# Patient Record
Sex: Female | Born: 1945
Health system: Southern US, Community
[De-identification: ages and names within clinical notes are randomized; demographics above are authoritative.]

## PROBLEM LIST (undated history)

## (undated) DIAGNOSIS — R7989 Other specified abnormal findings of blood chemistry: Secondary | ICD-10-CM

## (undated) DIAGNOSIS — E785 Hyperlipidemia, unspecified: Secondary | ICD-10-CM

## (undated) DIAGNOSIS — U071 COVID-19: Secondary | ICD-10-CM

## (undated) DIAGNOSIS — M810 Age-related osteoporosis without current pathological fracture: Secondary | ICD-10-CM

## (undated) HISTORY — DX: Other specified abnormal findings of blood chemistry: R79.89

## (undated) HISTORY — PX: ROOT CANAL: SHX2363

## (undated) HISTORY — DX: Hyperlipidemia, unspecified: E78.5

## (undated) HISTORY — PX: FACIAL COSMETIC SURGERY: SHX629

## (undated) HISTORY — PX: TUBAL LIGATION: SHX77

## (undated) HISTORY — DX: Age-related osteoporosis without current pathological fracture: M81.0

## (undated) HISTORY — PX: WRIST FRACTURE SURGERY: SHX121

## (undated) HISTORY — DX: COVID-19: U07.1

## (undated) HISTORY — PX: FRACTURE SURGERY: SHX138

---

## 2011-01-26 HISTORY — PX: KNEE ARTHROSCOPY: SUR90

## 2012-08-29 ENCOUNTER — Emergency Department: Payer: Self-pay | Admitting: Emergency Medicine

## 2012-10-21 ENCOUNTER — Encounter: Payer: Self-pay | Admitting: Orthopedic Surgery

## 2012-10-25 ENCOUNTER — Encounter: Payer: Self-pay | Admitting: Orthopedic Surgery

## 2012-11-25 ENCOUNTER — Encounter: Payer: Self-pay | Admitting: Orthopedic Surgery

## 2012-12-26 ENCOUNTER — Encounter: Payer: Self-pay | Admitting: Orthopedic Surgery

## 2013-01-25 ENCOUNTER — Encounter: Payer: Self-pay | Admitting: Orthopedic Surgery

## 2013-02-02 ENCOUNTER — Ambulatory Visit: Payer: Self-pay | Admitting: Family Medicine

## 2013-02-03 DIAGNOSIS — M6530 Trigger finger, unspecified finger: Secondary | ICD-10-CM | POA: Insufficient documentation

## 2013-02-03 DIAGNOSIS — S52502P Unspecified fracture of the lower end of left radius, subsequent encounter for closed fracture with malunion: Secondary | ICD-10-CM | POA: Insufficient documentation

## 2013-02-25 ENCOUNTER — Encounter: Payer: Self-pay | Admitting: Orthopedic Surgery

## 2013-03-27 ENCOUNTER — Encounter: Payer: Self-pay | Admitting: Orthopedic Surgery

## 2013-04-14 DIAGNOSIS — Z969 Presence of functional implant, unspecified: Secondary | ICD-10-CM | POA: Insufficient documentation

## 2013-04-27 ENCOUNTER — Encounter: Payer: Self-pay | Admitting: Orthopedic Surgery

## 2013-05-28 ENCOUNTER — Encounter: Payer: Self-pay | Admitting: Orthopedic Surgery

## 2013-06-25 ENCOUNTER — Encounter: Payer: Self-pay | Admitting: Orthopedic Surgery

## 2013-07-26 ENCOUNTER — Encounter: Payer: Self-pay | Admitting: Orthopedic Surgery

## 2013-08-25 ENCOUNTER — Encounter: Payer: Self-pay | Admitting: Orthopedic Surgery

## 2014-01-10 ENCOUNTER — Ambulatory Visit: Payer: Self-pay | Admitting: Family Medicine

## 2014-01-26 ENCOUNTER — Ambulatory Visit: Payer: Self-pay | Admitting: Gastroenterology

## 2014-07-16 DIAGNOSIS — M79605 Pain in left leg: Secondary | ICD-10-CM | POA: Diagnosis not present

## 2014-07-16 DIAGNOSIS — E784 Other hyperlipidemia: Secondary | ICD-10-CM | POA: Diagnosis not present

## 2014-07-16 DIAGNOSIS — M65331 Trigger finger, right middle finger: Secondary | ICD-10-CM | POA: Diagnosis not present

## 2014-07-16 DIAGNOSIS — Z7189 Other specified counseling: Secondary | ICD-10-CM | POA: Diagnosis not present

## 2014-08-06 DIAGNOSIS — M65331 Trigger finger, right middle finger: Secondary | ICD-10-CM | POA: Diagnosis not present

## 2014-08-26 HISTORY — PX: TRIGGER FINGER RELEASE: SHX641

## 2014-09-19 DIAGNOSIS — M65331 Trigger finger, right middle finger: Secondary | ICD-10-CM | POA: Diagnosis not present

## 2014-10-10 DIAGNOSIS — Z9889 Other specified postprocedural states: Secondary | ICD-10-CM | POA: Diagnosis not present

## 2014-10-15 ENCOUNTER — Encounter: Payer: Self-pay | Admitting: Family Medicine

## 2014-10-15 ENCOUNTER — Ambulatory Visit (INDEPENDENT_AMBULATORY_CARE_PROVIDER_SITE_OTHER): Payer: Commercial Managed Care - HMO | Admitting: Family Medicine

## 2014-10-15 VITALS — BP 108/66 | HR 92 | Temp 97.6°F | Ht 64.5 in | Wt 137.0 lb

## 2014-10-15 DIAGNOSIS — E785 Hyperlipidemia, unspecified: Secondary | ICD-10-CM | POA: Diagnosis not present

## 2014-10-15 DIAGNOSIS — E559 Vitamin D deficiency, unspecified: Secondary | ICD-10-CM | POA: Diagnosis not present

## 2014-10-15 DIAGNOSIS — D649 Anemia, unspecified: Secondary | ICD-10-CM | POA: Diagnosis not present

## 2014-10-15 NOTE — Progress Notes (Signed)
Name: Stacey Munoz   MRN: [id number]    DOB: 1945/07/05   Date:10/15/2014       Progress Note  Subjective  Chief Complaint  Chief Complaint  Patient presents with  . Follow-up    Vitamin D deficiency    Hyperlipidemia This is a chronic problem. The problem is uncontrolled. Recent lipid tests were reviewed and are high. Pertinent negatives include no chest pain, leg pain, myalgias or shortness of breath. Current antihyperlipidemic treatment includes exercise and diet change (Patient has been resistant to statin therapy in the past). Compliance problems include adherence to exercise and adherence to diet.  Risk factors for coronary artery disease include dyslipidemia, family history and post-menopausal.      Past Medical History  Diagnosis Date  . Low serum vitamin D   . Hyperlipidemia     Past Surgical History  Procedure Laterality Date  . Tubal ligation    . Wrist fracture surgery    . Trigger finger release Right May 2016.    Family History  Problem Relation Age of Onset  . Cancer Mother   . Diabetes Father   . Cancer Father   . Heart disease Brother     History   Social History  . Marital Status: Unknown    Spouse Name: N/A  . Number of Children: N/A  . Years of Education: N/A   Occupational History  . Not on file.   Social History Main Topics  . Smoking status: Never Smoker   . Smokeless tobacco: Not on file  . Alcohol Use: 0.6 oz/week    0 Standard drinks or equivalent, 1 Glasses of wine per week  . Drug Use: No  . Sexual Activity: Not on file   Other Topics Concern  . Not on file   Social History Narrative  . No narrative on file     Current outpatient prescriptions:  .  calcium-vitamin D (SM CALCIUM 500/VITAMIN D3) 500-400 MG-UNIT per tablet, Take by mouth., Disp: , Rfl:  .  Flaxseed-Eve Prim-Borage (FLAX OIL XTRA) CAPS, Take by mouth., Disp: , Rfl:  .  Multiple Vitamin (MULTI-VITAMINS) TABS, Take by mouth., Disp: , Rfl:   Not on  File   Review of Systems  Respiratory: Negative for shortness of breath.   Cardiovascular: Negative for chest pain and palpitations.  Musculoskeletal: Negative for myalgias.      Objective  Filed Vitals:   10/15/14 0845  BP: 108/66  Pulse: 92  Temp: 97.6 F (36.4 C)  TempSrc: Oral  Height: 5' 4.5" (1.638 m)  Weight: 137 lb (62.143 kg)  SpO2: 98%    Physical Exam  Constitutional: She is oriented to person, place, and time and well-developed, well-nourished, and in no distress.  HENT:  Head: Normocephalic and atraumatic.  Cardiovascular: Normal rate and regular rhythm.   Pulmonary/Chest: Effort normal and breath sounds normal.  Abdominal: Soft. Bowel sounds are normal.  Neurological: She is alert and oriented to person, place, and time.  Nursing note and vitals reviewed.      No results found for this or any previous visit (from the past 2160 hour(s)).   Assessment & Plan 1. Low hemoglobin Patient has been told told that she had low iron when trying to donate blood. We will obtain EBC for evaluation of hemoglobin and hematocrit first and then further iron studies if indicated. - CBC w/Diff  2. Vitamin D deficiency  - Vitamin D (25 hydroxy)  3. Hyperlipidemia Shouldn't is not agreeable to taking a  statin for hyperlipidemia. We will repeat fasting lipid panel and follow-up - Lipid panel    Shera Laubach [facility] 10/15/2014 9:11 AM

## 2014-10-16 LAB — CBC WITH DIFFERENTIAL/PLATELET
BASOS ABS: 0 10*3/uL (ref 0.0–0.2)
Basos: 0 %
EOS (ABSOLUTE): 0.2 10*3/uL (ref 0.0–0.4)
Eos: 4 %
HEMOGLOBIN: 14.4 g/dL (ref 11.1–15.9)
Hematocrit: 42.9 % (ref 34.0–46.6)
Immature Grans (Abs): 0 10*3/uL (ref 0.0–0.1)
Immature Granulocytes: 0 %
LYMPHS: 37 %
Lymphocytes Absolute: 1.8 10*3/uL (ref 0.7–3.1)
MCH: 30.4 pg (ref 26.6–33.0)
MCHC: 33.6 g/dL (ref 31.5–35.7)
MCV: 91 fL (ref 79–97)
MONOCYTES: 5 %
Monocytes Absolute: 0.2 10*3/uL (ref 0.1–0.9)
NEUTROS ABS: 2.7 10*3/uL (ref 1.4–7.0)
NEUTROS PCT: 54 %
Platelets: 236 10*3/uL (ref 150–379)
RBC: 4.74 x10E6/uL (ref 3.77–5.28)
RDW: 14.1 % (ref 12.3–15.4)
WBC: 4.9 10*3/uL (ref 3.4–10.8)

## 2014-10-16 LAB — VITAMIN D 25 HYDROXY (VIT D DEFICIENCY, FRACTURES): VIT D 25 HYDROXY: 31.5 ng/mL (ref 30.0–100.0)

## 2014-10-16 LAB — LIPID PANEL
CHOLESTEROL TOTAL: 291 mg/dL — AB (ref 100–199)
Chol/HDL Ratio: 4.7 ratio units — ABNORMAL HIGH (ref 0.0–4.4)
HDL: 62 mg/dL (ref >39)
LDL CALC: 172 mg/dL — AB (ref 0–99)
Triglycerides: 287 mg/dL — ABNORMAL HIGH (ref 0–149)
VLDL Cholesterol Cal: 57 mg/dL — ABNORMAL HIGH (ref 5–40)

## 2014-10-16 NOTE — Progress Notes (Signed)
Called patient, LM to call us that I have her labs and the Dr has some recommendations.

## 2014-10-17 ENCOUNTER — Other Ambulatory Visit: Payer: Self-pay | Admitting: *Deleted

## 2014-10-17 DIAGNOSIS — E785 Hyperlipidemia, unspecified: Secondary | ICD-10-CM

## 2014-10-17 MED ORDER — ROSUVASTATIN CALCIUM 5 MG PO TABS: 5.0000 mg | ORAL_TABLET | Freq: Every day | ORAL | Status: DC

## 2014-10-17 NOTE — Progress Notes (Signed)
Patient came into office, reviewed labs, she was agreeable to the lowest Crestor dose.  Talked to Dr Manuella Ghazi, ordered Crestor 5mg  daily.

## 2014-10-18 ENCOUNTER — Ambulatory Visit: Payer: Commercial Managed Care - HMO | Attending: Orthopedic Surgery | Admitting: Occupational Therapy

## 2014-10-18 DIAGNOSIS — M25641 Stiffness of right hand, not elsewhere classified: Secondary | ICD-10-CM | POA: Insufficient documentation

## 2014-10-18 DIAGNOSIS — M6281 Muscle weakness (generalized): Secondary | ICD-10-CM | POA: Diagnosis not present

## 2014-10-18 DIAGNOSIS — M25541 Pain in joints of right hand: Secondary | ICD-10-CM

## 2014-10-18 DIAGNOSIS — M79641 Pain in right hand: Secondary | ICD-10-CM | POA: Diagnosis not present

## 2014-10-18 NOTE — Therapy (Signed)
[facility] [address] [place], [address] Phone: [phone]   Fax:  [phone]  Occupational Therapy Treatment  Patient Details  Name: Stacey Munoz MRN: [id number] Date of Birth: July 05, 1945 Referring Provider:  Hessie Knows, MD  Encounter Date: 10/18/2014      OT End of Session - 10/18/14 1909    OT Start Time 1856   OT Stop Time 1600   OT Time Calculation (min) 45 min   Activity Tolerance Patient tolerated treatment well   Behavior During Therapy [facility] for tasks assessed/performed      Past Medical History  Diagnosis Date  . Low serum vitamin D   . Hyperlipidemia     Past Surgical History  Procedure Laterality Date  . Tubal ligation    . Wrist fracture surgery    . Trigger finger release Right May 2016.    There were no vitals filed for this visit.  Visit Diagnosis:  Pain in joint of right hand - Plan: Ot plan of care cert/re-cert  Stiffness of finger joint of right hand - Plan: Ot plan of care cert/re-cert  Muscle weakness - Plan: Ot plan of care cert/re-cert      Subjective Assessment - 10/18/14 1520    Subjective  My finger and in palm over scar still swolllen, sore making fist, tight - see DR July 11th - cannot grip things - afraid for scar tissue   Patient Stated Goals I do not want scar tissue like my L wrist fracture the time - want to make fist , no scar tissue and use it  without issues   Currently in Pain? Yes   Pain Score 3    Pain Location Hand   Pain Orientation Right   Pain Descriptors / Indicators Contraction   Pain Onset More than a month ago   Pain Frequency Constant            [facility] OT Assessment - 10/18/14 0001    Edema   Edema  Proximal phalanges R 6.6 ,L 5.9; PIP R 6.6 and L 6.3   Strength   Right Hand Grip (lbs) 42   Right Hand Lateral Pinch 15 lbs   Right Hand 3 Point Pinch 10 lbs   Left Hand Grip (lbs) 45   Left Hand Lateral Pinch 17 lbs   Left Hand 3 Point Pinch 15 lbs   Right Hand AROM   R Long  MCP 0-90 90 Degrees   R Long PIP 0-100 85 Degrees  -10   R Long DIP 0-70 60 Degrees                  OT Treatments/Exercises (OP) - 10/18/14 0001    RUE Paraffin   Number Minutes Paraffin 10 Minutes   RUE Paraffin Location Hand   Comments decrease scar tissue and decrease pain prior to review of HEP                 OT Education - 10/18/14 1542    Education provided Yes   Education Details HEP   Person(s) Educated Patient   Methods Explanation;Demonstration;Verbal cues   Comprehension Verbalized understanding;Returned demonstration;Verbal cues required          OT Short Term Goals - 10/18/14 1914    OT SHORT TERM GOAL #1   Title Pt's flexion of 3rd digit  to make full fist  to hold onto knife to cut without increase symptoms   Baseline  flexion 85 PIP and tenderness - pain at the worse 8/10   Time 3   Period Weeks   Status New   OT SHORT TERM GOAL #2   Title Pain on PRWHE improve by 10-15 points    Baseline PRWHE pain 30/50   Time 3   Period Weeks   Status New   OT SHORT TERM GOAL #3   Title Extention to WNL to put hand in pocket    Baseline -10 degrees at PIP    Time 2   Period Weeks   Status New           OT Long Term Goals - 11-10-14 1918    OT LONG TERM GOAL #1   Title Funciton on PRWHE improve by 10 points at least   Baseline Function on PRWHE was 19.5/50    Time 4   Period Weeks   Status New   OT LONG TERM GOAL #2   Title 3 point pinch improve with 3 lbs to improve functional use of R hand without increase pain    Time 4   Period Weeks   Status New               Plan - 10-Nov-2014 1910    Clinical Impression Statement Pt present about 4 wks out of trigger finger release on R 3rd digit - pt had increase edema, pain     and scar tissue , decrease ROM and strength - limiting her functional use of R dominant hand - she llikes to remodel, travel , read , active in  communty -    Pt will benefit from skilled therapeutic intervention in order to improve on the following deficits (Retired) Decreased range of motion;Impaired flexibility;Increased edema;Pain;Decreased scar mobility;Decreased strength   Rehab Potential Good   OT Frequency 2x / week   OT Duration 4 weeks   OT Treatment/Interventions Self-care/ADL training;Parrafin;Ultrasound;Moist Heat;Therapeutic exercise;Manual Therapy;Scar mobilization;Passive range of motion;Splinting;Patient/family education   OT Home Exercise Plan See pt instruction    Consulted and Agree with Plan of Care Patient          G-Codes - 2014/11/10 1922    Functional Assessment Tool Used PRWHE, ROM , strength, clinical judgement   Functional Limitation Self care   Self Care Current Status [phone]) At least 20 percent but less than 40 percent impaired, limited or restricted   Self Care Goal Status ([id number]) At least 1 percent but less than 20 percent impaired, limited or restricted      Problem List Patient Active Problem List   Diagnosis Date Noted  . Low hemoglobin 10/15/2014  . Vitamin D deficiency 10/15/2014  . Hyperlipidemia 10/15/2014  . Triggering of digit 02/03/2013    Rosalyn Gess OTR/L, CLT 11/10/14, 7:26 PM  [facility] [address], [place], [address] Phone: [phone]   Fax:  [phone]

## 2014-10-18 NOTE — Patient Instructions (Addendum)
Heat , Composite flexion of  4th digit',  blocked  AROM PIP and DIP   Compression sleeve night time and day use  Extention rolling putty green  Scar massage and scar pad at night time

## 2014-10-22 ENCOUNTER — Ambulatory Visit: Payer: Commercial Managed Care - HMO | Admitting: Occupational Therapy

## 2014-10-22 DIAGNOSIS — M25641 Stiffness of right hand, not elsewhere classified: Secondary | ICD-10-CM | POA: Diagnosis not present

## 2014-10-22 DIAGNOSIS — M6281 Muscle weakness (generalized): Secondary | ICD-10-CM

## 2014-10-22 DIAGNOSIS — M25541 Pain in joints of right hand: Secondary | ICD-10-CM

## 2014-10-22 DIAGNOSIS — M79641 Pain in right hand: Secondary | ICD-10-CM | POA: Diagnosis not present

## 2014-10-22 NOTE — Patient Instructions (Signed)
Same HEP but add buddy strap to use in between HEP session and alternate between 2nd and 4th digit -

## 2014-10-22 NOTE — Therapy (Signed)
[facility] [address] [place], [address] Phone: [phone]   Fax:  [phone]  Occupational Therapy Treatment  Patient Details  Name: Stacey Munoz MRN: [id number] Date of Birth: Sep 08, 1945 Referring Provider:  Hessie Knows, MD  Encounter Date: 10/22/2014      OT End of Session - 10/22/14 1126    OT Start Time 1005   OT Stop Time 1101   OT Time Calculation (min) 56 min      Past Medical History  Diagnosis Date  . Low serum vitamin D   . Hyperlipidemia     Past Surgical History  Procedure Laterality Date  . Tubal ligation    . Wrist fracture surgery    . Trigger finger release Right May 2016.    There were no vitals filed for this visit.  Visit Diagnosis:  Pain in joint of right hand  Stiffness of finger joint of right hand  Muscle weakness      Subjective Assessment - 10/22/14 1116    Subjective  My finger still stiff and did yoga this am and execises and I cannot make tight fist -this miiddle finger not going down - also scar puffy   Patient Stated Goals I do not want scar tissue like my L wrist fracture the time - want to make fist , no scar tissue and use it  without issues   Currently in Pain? Yes   Pain Score 2    Pain Location Hand   Pain Orientation Right   Pain Descriptors / Indicators Tightness   Pain Type Surgical pain   Pain Onset More than a month ago   Aggravating Factors  if trying to make tight fist                      OT Treatments/Exercises (OP) - 10/22/14 0001    Exercises   Exercises Hand   Hand Exercises   Other Hand Exercises tendon gliding , full fist place and hold , and AROM - squeezing small ball of putty at end range    Other Hand Exercises Fitted with buddy strap to use between HEP to maintain flexion of 3rd digit;    Ultrasound   Ultrasound Location palmar scar   Ultrasound Parameters CUS at 3.36mHZ at 0.8 int prior to manual    Ultrasound Goals Other (Comment)   RUE Paraffin   Number Minutes Paraffin 10 Minutes   RUE Paraffin Location Hand   Comments Increase flexion of 3rd digit and decrease scar tissue   Manual Therapy   Manual therapy comments Scar mobs done after [place] - vibration use  and mobs with and without flexion in opposite directions                 OT Education - 10/22/14 1123    Education provided Yes   Education Details HEP   Person(s) Educated Patient   Methods Explanation;Demonstration;Tactile cues;Verbal cues   Comprehension Verbalized understanding;Returned demonstration;Verbal cues required;Tactile cues required          OT Short Term Goals - 10/18/14 1914    OT SHORT TERM GOAL #1   Title Pt's flexion of 3rd digit  to make full fist  to hold onto knife to cut without increase symptoms   Baseline flexion 85 PIP and tenderness - pain at the worse 8/10   Time 3   Period Weeks   Status New   OT SHORT TERM  GOAL #2   Title Pain on PRWHE improve by 10-15 points    Baseline PRWHE pain 30/50   Time 3   Period Weeks   Status New   OT SHORT TERM GOAL #3   Title Extention to WNL to put hand in pocket    Baseline -10 degrees at PIP    Time 2   Period Weeks   Status New           OT Long Term Goals - 10/18/14 1918    OT LONG TERM GOAL #1   Title Funciton on PRWHE improve by 10 points at least   Baseline Function on PRWHE was 19.5/50    Time 4   Period Weeks   Status New   OT LONG TERM GOAL #2   Title 3 point pinch improve with 3 lbs to improve functional use of R hand without increase pain    Time 4   Period Weeks   Status New               Plan - 10/22/14 1123    Clinical Impression Statement Pt scar tissue hard and tight coming in - but much softer after manual therapy - also report less  pull in palm during fisting AROM at end of session better - flexion of 3rd digit improve during session - buddy strap fitted to maintain ROM at 3rd and prevent deviation of  2nd and 4th  preventing 3rd  flexion at end range    Pt will benefit from skilled therapeutic intervention in order to improve on the following deficits (Retired) Decreased range of motion;Impaired flexibility;Increased edema;Pain;Decreased scar mobility;Decreased strength   Rehab Potential Good   OT Frequency 2x / week   OT Duration 4 weeks   OT Treatment/Interventions Self-care/ADL training;Parrafin;Ultrasound;Moist Heat;Therapeutic exercise;Manual Therapy;Scar mobilization;Passive range of motion;Splinting;Patient/family education   OT Home Exercise Plan See pt instruction    Consulted and Agree with Plan of Care Patient        Problem List Patient Active Problem List   Diagnosis Date Noted  . Low hemoglobin 10/15/2014  . Vitamin D deficiency 10/15/2014  . Hyperlipidemia 10/15/2014  . Triggering of digit 02/03/2013    Rosalyn Gess OTR/L, CLT 10/22/2014, 11:28 AM  [facility] [address] [place], [address] Phone: [phone]   Fax:  [phone]

## 2014-10-25 ENCOUNTER — Ambulatory Visit: Payer: Commercial Managed Care - HMO | Admitting: Occupational Therapy

## 2014-10-25 DIAGNOSIS — M6281 Muscle weakness (generalized): Secondary | ICD-10-CM | POA: Diagnosis not present

## 2014-10-25 DIAGNOSIS — M25641 Stiffness of right hand, not elsewhere classified: Secondary | ICD-10-CM

## 2014-10-25 DIAGNOSIS — M25541 Pain in joints of right hand: Secondary | ICD-10-CM

## 2014-10-25 DIAGNOSIS — M79641 Pain in right hand: Secondary | ICD-10-CM | POA: Diagnosis not present

## 2014-10-25 NOTE — Patient Instructions (Signed)
Scar management the same than before  AROM blocked DIP , PIP , PROM comoposite fist  AROM full fist   No putty end range  - and do buddy strap only to 3rd with 2nd   Switch compression to silicon sleeve that allow more ROM and not as tigth

## 2014-10-25 NOTE — Therapy (Signed)
[facility] [address] [place], [address] Phone: [phone]   Fax:  [phone]  Occupational Therapy Treatment  Patient Details  Name: Stacey Munoz MRN: [id number] Date of Birth: 04/07/1946 Referring Provider:  Hessie Knows, MD  Encounter Date: 10/25/2014      OT End of Session - 10/25/14 0931    Visit Number 3   Number of Visits 8   Date for OT Re-Evaluation 11/15/14   OT Start Time 0835   OT Stop Time 0914   OT Time Calculation (min) 39 min   Activity Tolerance Patient tolerated treatment well   Behavior During Therapy [facility] for tasks assessed/performed      Past Medical History  Diagnosis Date  . Low serum vitamin D   . Hyperlipidemia     Past Surgical History  Procedure Laterality Date  . Tubal ligation    . Wrist fracture surgery    . Trigger finger release Right May 2016.    There were no vitals filed for this visit.  Visit Diagnosis:  Pain in joint of right hand  Stiffness of finger joint of right hand  Muscle weakness      Subjective Assessment - 10/25/14 0924    Subjective  I did not do any exercises - but my finger just stiff this am - and swollen some - my ring finger wants to lock now some - but it is bending better    Patient Stated Goals I do not want scar tissue like my L wrist fracture the time - want to make fist , no scar tissue and use it  without issues   Currently in Pain? Yes   Pain Score 1    Pain Location Hand   Pain Orientation Right   Pain Descriptors / Indicators Aching;Tightness   Pain Type Surgical pain   Pain Onset More than a month ago   Pain Frequency Constant                      OT Treatments/Exercises (OP) - 10/25/14 0001    Hand Exercises   Other Hand Exercises AROM blocked to 3rd DIP , PIP , PROM composite flexion to palm    Other Hand Exercises AROM full fist to palm    Ultrasound   Ultrasound Location palmar scar   Ultrasound Parameters CUS at 3.3MHZ 0.8 intensity , for 5 min    Ultrasound Goals Other (Comment)   RUE Paraffin   Number Minutes Paraffin 10 Minutes   RUE Paraffin Location Hand   Comments Decrease scar  tissue and increase ROM at 3rd diigit   Manual Therapy   Manual therapy comments Scar mobs done after [place] - vibration use  and mobs with and without flexion in opposite directions                 OT Education - 10/25/14 0930    Education provided Yes   Education Details HEP   Person(s) Educated Patient   Methods Explanation;Demonstration;Tactile cues;Verbal cues   Comprehension Verbalized understanding;Returned demonstration;Verbal cues required;Tactile cues required          OT Short Term Goals - 10/18/14 1914    OT SHORT TERM GOAL #1   Title Pt's flexion of 3rd digit  to make full fist  to hold onto knife to cut without increase symptoms   Baseline flexion 85 PIP and tenderness - pain at the worse 8/10   Time  3   Period Weeks   Status New   OT SHORT TERM GOAL #2   Title Pain on PRWHE improve by 10-15 points    Baseline PRWHE pain 30/50   Time 3   Period Weeks   Status New   OT SHORT TERM GOAL #3   Title Extention to WNL to put hand in pocket    Baseline -10 degrees at PIP    Time 2   Period Weeks   Status New           OT Long Term Goals - 10/18/14 1918    OT LONG TERM GOAL #1   Title Funciton on PRWHE improve by 10 points at least   Baseline Function on PRWHE was 19.5/50    Time 4   Period Weeks   Status New   OT LONG TERM GOAL #2   Title 3 point pinch improve with 3 lbs to improve functional use of R hand without increase pain    Time 4   Period Weeks   Status New               Plan - 10/25/14 0931    Clinical Impression Statement AROM 3rd digiit improve for fisting - but edema is .6 increase at PIP compare to L - and scar tissus still thick coming in but response great to therapy and manual - pt to cont with HEP - but do not use buddy  strap on 4th or place and hold full fist - because report some triggering in 4th    Pt will benefit from skilled therapeutic intervention in order to improve on the following deficits (Retired) Decreased range of motion;Impaired flexibility;Increased edema;Pain;Decreased scar mobility;Decreased strength   Rehab Potential Good   OT Frequency 2x / week   OT Duration 4 weeks   OT Treatment/Interventions Self-care/ADL training;Parrafin;Ultrasound;Moist Heat;Therapeutic exercise;Manual Therapy;Scar mobilization;Passive range of motion;Splinting;Patient/family education   OT Home Exercise Plan See pt instruction    Consulted and Agree with Plan of Care Patient        Problem List Patient Active Problem List   Diagnosis Date Noted  . Low hemoglobin 10/15/2014  . Vitamin D deficiency 10/15/2014  . Hyperlipidemia 10/15/2014  . Triggering of digit 02/03/2013    Rosalyn Gess OTR/L, CLT 10/25/2014, 9:33 AM  [facility] [address] [place], [address] Phone: [phone]   Fax:  [phone]

## 2014-11-01 ENCOUNTER — Telehealth: Payer: Self-pay | Admitting: Family Medicine

## 2014-11-01 NOTE — Telephone Encounter (Signed)
PT SAID THAT SHE IS NOT TAKING THE A STATIN DRUG FOR INSURANCE WILL NOT COVER IT.

## 2014-11-02 ENCOUNTER — Ambulatory Visit: Payer: Commercial Managed Care - HMO | Attending: Orthopedic Surgery | Admitting: Occupational Therapy

## 2014-11-02 DIAGNOSIS — M79641 Pain in right hand: Secondary | ICD-10-CM | POA: Insufficient documentation

## 2014-11-02 DIAGNOSIS — M6281 Muscle weakness (generalized): Secondary | ICD-10-CM | POA: Diagnosis not present

## 2014-11-02 DIAGNOSIS — M25641 Stiffness of right hand, not elsewhere classified: Secondary | ICD-10-CM | POA: Diagnosis not present

## 2014-11-02 DIAGNOSIS — M25541 Pain in joints of right hand: Secondary | ICD-10-CM

## 2014-11-02 NOTE — Patient Instructions (Signed)
Same as last time - for blocked AROM for PIP and DIP  Composite flexion full fist Add green putty for gripping and 3 point   Scar massage  Not to force movement - AROM  And not to over stretch finger into extention

## 2014-11-02 NOTE — Therapy (Signed)
[place] [facility] [address], [place], [address] Phone: [phone]   Fax:  [phone]  Occupational Therapy Treatment  Patient Details  Name: Stacey Munoz MRN: [id number] Date of Birth: 12-Jun-1945 Referring Provider:  Hessie Knows, MD  Encounter Date: 11/02/2014      OT End of Session - 11/02/14 0856    Visit Number 4   Number of Visits 8   Date for OT Re-Evaluation 11/15/14   OT Start Time 0801   OT Stop Time 0847   OT Time Calculation (min) 46 min   Activity Tolerance Patient tolerated treatment well   Behavior During Therapy [facility] for tasks assessed/performed      Past Medical History  Diagnosis Date  . Low serum vitamin D   . Hyperlipidemia     Past Surgical History  Procedure Laterality Date  . Tubal ligation    . Wrist fracture surgery    . Trigger finger release Right May 2016.    There were no vitals filed for this visit.  Visit Diagnosis:  Pain in joint of right hand  Stiffness of finger joint of right hand  Muscle weakness      Subjective Assessment - 11/02/14 0814    Subjective  Swelling around the scar in my palm - I do the stretching finger a lot - scar massage doing only 3 x day    Patient Stated Goals I do not want scar tissue like my L wrist fracture the time - want to make fist , no scar tissue and use it  without issues   Currently in Pain? Yes   Pain Score 1    Pain Location Hand   Pain Orientation Right   Pain Descriptors / Indicators Sore   Pain Type Acute pain   Pain Onset More than a month ago   Pain Frequency Constant            [facility] OT Assessment - 11/02/14 0001    Strength   Right Hand Grip (lbs) 42   Right Hand Lateral Pinch 15 lbs   Right Hand 3 Point Pinch 10 lbs   Left Hand Grip (lbs) 45   Left Hand Lateral Pinch 17 lbs   Left Hand 3 Point Pinch 15 lbs   Right Hand AROM   R Long  MCP 0-90 90 Degrees   R Long PIP 0-100 90 Degrees  -10                   OT Treatments/Exercises (OP) - 11/02/14 0001    Hand Exercises   Other Hand Exercises AROM tendon gliding - not to force -  gentle PROM composite fist , AROM    Other Hand Exercises Composite fist and wrist flexion to composite extention wrist and digits - add green putty for gripping, and 3 point    Ultrasound   Ultrasound Location palmar scar   Ultrasound Parameters [place] at 3.3MHZ, 20% and 1.0 intensity for 4 min    Ultrasound Goals Edema   RUE Paraffin   Number Minutes Paraffin 10 Minutes   RUE Paraffin Location Hand   Comments Decrease scar and increase ROM    Manual Therapy   Manual therapy comments Scar mobs - vibration done this date - pt not feeling as much pull this time                 OT Education - 11/02/14 0856    Education provided  Yes   Education Details HEP   Person(s) Educated Patient   Methods Explanation;Demonstration;Handout   Comprehension Verbalized understanding;Returned demonstration;Tactile cues required          OT Short Term Goals - 11/02/14 0859    OT SHORT TERM GOAL #1   Title Pt's flexion of 3rd digit  to make full fist  to hold onto knife to cut without increase symptoms   Time 2   Period Weeks   Status On-going   OT SHORT TERM GOAL #2   Title Pain on PRWHE improve by 10-15 points    Time 2   Period Weeks   Status On-going   OT SHORT TERM GOAL #3   Title Extention to WNL to put hand in pocket    Time 2   Status On-going           OT Long Term Goals - 11/02/14 0859    OT LONG TERM GOAL #1   Title Funciton on PRWHE improve by 10 points at least   Time 2   Period Weeks   Status On-going   OT LONG TERM GOAL #2   Title 3 point pinch improve with 3 lbs to improve functional use of R hand without increase pain    Time 2   Period Weeks   Status On-going               Plan - 11/02/14 0857    Clinical Impression Statement Pt report still stiffness in am and swelling in palm distal to scar -  pt do scar massage maybe to hard , and when massaging finger she pushes it into hyper extention - pt to not force AROM - and cont scar massage  - but did not feel any adhesions that is holding back ROM -  did initiated some putty HEP - would recommend maybe 2 more viist - ptto see MD Monday    Pt will benefit from skilled therapeutic intervention in order to improve on the following deficits (Retired) Decreased range of motion;Impaired flexibility;Increased edema;Pain;Decreased scar mobility;Decreased strength   Rehab Potential Good   OT Frequency 1x / week   OT Duration 2 weeks   OT Treatment/Interventions Self-care/ADL training;Parrafin;Ultrasound;Moist Heat;Therapeutic exercise;Manual Therapy;Scar mobilization;Passive range of motion;Splinting;Patient/family education   Plan Check on MD appt results and HEP    OT Home Exercise Plan See pt instruction    Consulted and Agree with Plan of Care Patient        Problem List Patient Active Problem List   Diagnosis Date Noted  . Low hemoglobin 10/15/2014  . Vitamin D deficiency 10/15/2014  . Hyperlipidemia 10/15/2014  . Triggering of digit 02/03/2013    Rosalyn Gess OTR/L, CLT 11/02/2014, 9:01 AM  [place] [facility] [address] [place], [address] Phone: [phone]   Fax:  [phone]

## 2014-11-02 NOTE — Telephone Encounter (Signed)
Spoke with the patient and apparently her insurance company will not cover Crestor. I have asked patient to check with her insurance company as to which statins are covered under her plan. Patient verbalized understanding and will inform us after she checks with her insurance company

## 2014-11-08 ENCOUNTER — Ambulatory Visit: Payer: Commercial Managed Care - HMO | Admitting: Occupational Therapy

## 2014-11-08 DIAGNOSIS — M6281 Muscle weakness (generalized): Secondary | ICD-10-CM

## 2014-11-08 DIAGNOSIS — M79641 Pain in right hand: Secondary | ICD-10-CM | POA: Diagnosis not present

## 2014-11-08 DIAGNOSIS — M25641 Stiffness of right hand, not elsewhere classified: Secondary | ICD-10-CM

## 2014-11-08 DIAGNOSIS — M25541 Pain in joints of right hand: Secondary | ICD-10-CM

## 2014-11-08 NOTE — Therapy (Signed)
[place] [facility] [address], [place], [address] Phone: [phone]   Fax:  [phone]  Occupational Therapy Treatment  Patient Details  Name: Stacey Munoz MRN: [id number] Date of Birth: 1945-05-25 Referring Provider:  Hessie Knows, MD  Encounter Date: 11/08/2014      OT End of Session - 11/08/14 [place]    Visit Number 5   Date for OT Re-Evaluation 11/15/14   OT Start Time 0805   OT Stop Time 0850   OT Time Calculation (min) 45 min   Activity Tolerance Patient tolerated treatment well   Behavior During Therapy [facility] for tasks assessed/performed      Past Medical History  Diagnosis Date  . Low serum vitamin D   . Hyperlipidemia     Past Surgical History  Procedure Laterality Date  . Tubal ligation    . Wrist fracture surgery    . Trigger finger release Right May 2016.    There were no vitals filed for this visit.  Visit Diagnosis:  Pain in joint of right hand  Stiffness of finger joint of right hand  Muscle weakness      Subjective Assessment - 11/08/14 1824    Subjective  Seen DR Rudene Christians - gave me something for swelling - and showed me this stretch for finger - my finger is stiff this am    Patient Stated Goals I do not want scar tissue like my L wrist fracture the time - want to make fist , no scar tissue and use it  without issues   Currently in Pain? Yes   Pain Score 1    Pain Location Finger (Comment which one)   Pain Orientation Right   Pain Descriptors / Indicators Sore   Pain Type Acute pain   Pain Onset More than a month ago                      OT Treatments/Exercises (OP) - 11/08/14 0001    Hand Exercises   Other Hand Exercises PROM of lumbrical stretch , and then place and hold , AROM with force proximal to dorsal proximal phalanges    Other Hand Exercises Composite fist, AROM tendong gliding - and cont wit hputty    Ultrasound   Ultrasound Location palmar scar     Ultrasound Parameters CUS at 3.3MHZ 0.8 intensity , over palmar scar prior to manaul    Ultrasound Goals Edema;Pain   RUE Paraffin   Number Minutes Paraffin 10 Minutes   RUE Paraffin Location Hand   Comments decrease scar and pain - increase ROM    Manual Therapy   Manual therapy comments Scar mobs and use vibration on scar and distally to into proximal phalanges that had more swelling this date                 OT Education - 11/08/14 1828    Education provided Yes   Education Details HEP   Person(s) Educated Patient   Methods Explanation;Demonstration;Tactile cues   Comprehension Verbalized understanding;Returned demonstration;Verbal cues required          OT Short Term Goals - 11/02/14 0859    OT SHORT TERM GOAL #1   Title Pt's flexion of 3rd digit  to make full fist  to hold onto knife to cut without increase symptoms   Time 2   Period Weeks   Status On-going   OT SHORT TERM GOAL #2   Title Pain  on PRWHE improve by 10-15 points    Time 2   Period Weeks   Status On-going   OT SHORT TERM GOAL #3   Title Extention to WNL to put hand in pocket    Time 2   Status On-going           OT Long Term Goals - 11/02/14 0859    OT LONG TERM GOAL #1   Title Funciton on PRWHE improve by 10 points at least   Time 2   Period Weeks   Status On-going   OT LONG TERM GOAL #2   Title 3 point pinch improve with 3 lbs to improve functional use of R hand without increase pain    Time 2   Period Weeks   Status On-going               Plan - 11/08/14 1829    Clinical Impression Statement Pt able to maintain full PIP extention for few reps after lumbrical fist stretch for 3rd - did feel pull this date and scar thick during manual - cont to do scar tissue and compressoin provided again for digits- increase edema proximal phlaneges    Pt will benefit from skilled therapeutic intervention in order to improve on the following deficits (Retired) Decreased range of  motion;Impaired flexibility;Increased edema;Pain;Decreased scar mobility;Decreased strength   Rehab Potential Good   OT Frequency 2x / week   OT Duration 2 weeks   OT Treatment/Interventions Self-care/ADL training;Parrafin;Ultrasound;Moist Heat;Therapeutic exercise;Manual Therapy;Scar mobilization;Passive range of motion;Splinting;Patient/family education   Plan PIP extention and scar tissue    OT Home Exercise Plan See pt instruction    Consulted and Agree with Plan of Care Patient        Problem List Patient Active Problem List   Diagnosis Date Noted  . Low hemoglobin 10/15/2014  . Vitamin D deficiency 10/15/2014  . Hyperlipidemia 10/15/2014  . Triggering of digit 02/03/2013    Rosalyn Gess OTR/L,CLT 11/08/2014, 6:32 PM  [place] [facility] [address], [place], [address] Phone: [phone]   Fax:  [phone]

## 2014-11-08 NOTE — Patient Instructions (Signed)
Same HEP , add lumbrical fist stretch to 3rd  And then place and hold and AROM lumbrical fist

## 2014-11-13 ENCOUNTER — Ambulatory Visit: Payer: Commercial Managed Care - HMO | Admitting: Occupational Therapy

## 2014-11-13 DIAGNOSIS — M25541 Pain in joints of right hand: Secondary | ICD-10-CM

## 2014-11-13 DIAGNOSIS — M79641 Pain in right hand: Secondary | ICD-10-CM | POA: Diagnosis not present

## 2014-11-13 DIAGNOSIS — M25641 Stiffness of right hand, not elsewhere classified: Secondary | ICD-10-CM | POA: Diagnosis not present

## 2014-11-13 DIAGNOSIS — M6281 Muscle weakness (generalized): Secondary | ICD-10-CM | POA: Diagnosis not present

## 2014-11-13 NOTE — Patient Instructions (Signed)
Lumbrical stretch , and AROM in to lumbrical fist USing hand and full fist - objects touching palm , and doing desensitizations  And previous HEP

## 2014-11-13 NOTE — Therapy (Signed)
[place] [facility] [address], [place], [address] Phone: [phone]   Fax:  [phone]  Occupational Therapy Treatment  Patient Details  Name: Stacey Munoz MRN: [id number] Date of Birth: 1945-09-12 Referring Provider:  Hessie Knows, MD  Encounter Date: 11/13/2014      OT End of Session - 11/13/14 1047    Visit Number 6   Date for OT Re-Evaluation 11/15/14   OT Start Time 0802   OT Stop Time 0845   OT Time Calculation (min) 43 min   Activity Tolerance Patient tolerated treatment well   Behavior During Therapy [facility] for tasks assessed/performed      Past Medical History  Diagnosis Date  . Low serum vitamin D   . Hyperlipidemia     Past Surgical History  Procedure Laterality Date  . Tubal ligation    . Wrist fracture surgery    . Trigger finger release Right May 2016.    There were no vitals filed for this visit.  Visit Diagnosis:  Pain in joint of right hand  Stiffness of finger joint of right hand  Muscle weakness      Subjective Assessment - 11/13/14 1023    Subjective  I am still on the anti imflammatory - stiff this am but I am trying to use it more - I still wants to keep that finger out - and not use it - I do notice finger going sideways    Patient Stated Goals I do not want scar tissue like my L wrist fracture the time - want to make fist , no scar tissue and use it  without issues   Currently in Pain? Yes   Pain Score 1    Pain Location Finger (Comment which one)   Pain Orientation Right   Pain Descriptors / Indicators Sore   Pain Type Acute pain   Pain Onset More than a month ago   Pain Frequency Occasional            [facility] OT Assessment - 11/13/14 0001    Strength   Right Hand 3 Point Pinch 12 lbs   Right Hand AROM   R Long  MCP 0-90 90 Degrees   R Long PIP 0-100 95 Degrees  0 after MC flexion PROM                   OT Treatments/Exercises (OP) -  11/13/14 0001    ADLs   ADL Comments Discuss with pt how to grip and use hand - focus on gripping handles, but avoid UD of digits - during gripping  - grab 90degrees objects    Hand Exercises   Other Hand Exercises PROM of lumbrical stretch , and then place and hold , AROM with force proximal to dorsal proximal phalanges    Other Hand Exercises composite flexion to palm , RD of digits   Ultrasound   Ultrasound Location palmar scar   Ultrasound Parameters [place] at 3.3MHZ at 20 % 4 min    Ultrasound Goals Edema;Pain   RUE Paraffin   Number Minutes Paraffin 10 Minutes   RUE Paraffin Location Hand   Comments increase ROM and decrease scar - in flexion    Manual Therapy   Manual therapy comments Scar mobs and use vibration on scar and distally to into proximal phalanges- did not feel pull this date at scar mobs - did ed pt on desentitization differenet texture and using hand as full  grasp - ojbects touching scar and palm                 OT Education - 11/13/14 1047    Education provided Yes   Education Details HEP   Person(s) Educated Patient   Methods Explanation;Demonstration;Tactile cues   Comprehension Verbalized understanding;Returned demonstration;Verbal cues required          OT Short Term Goals - 11/13/14 1050    OT SHORT TERM GOAL #1   Title Pt's flexion of 3rd digit  to make full fist  to hold onto knife to cut without increase symptoms   Baseline PIP 90 to 95; MC 90- pain at the worse 4/10   Time 2   Period Weeks   Status On-going   OT SHORT TERM GOAL #2   Title Pain on PRWHE improve by 10-15 points    Baseline PRWHE now 12/50   Time 2   Status Achieved   OT SHORT TERM GOAL #3   Title Extention to WNL to put hand in pocket    Baseline -10 walking in but in clinic after ther ex 0   Time 2   Period Weeks   Status On-going           OT Long Term Goals - 11/13/14 1052    OT LONG TERM GOAL #1   Title Funciton on PRWHE improve by 10 points at least    Baseline function on PRWHE now 6.5   Status Achieved   OT LONG TERM GOAL #2   Title 3 point pinch improve with 3 lbs to improve functional use of R hand without increase pain    Baseline 3 point 12 lbs ; L 15 lbs    Status Achieved               Plan - 11/13/14 1048    Clinical Impression Statement Pt making great progress in scar tissue, ROM at PIP with lumbircal stretch - able to  maintain for while - pt encourage to use hand with full fist for ojbects to touch palm and scar as well as using hand to avoid UD of digits - ed on jjoint protection and  desentitization of scar - pain improved on [place] to only 12/50 and function 6.5/50   Pt will benefit from skilled therapeutic intervention in order to improve on the following deficits (Retired) Decreased range of motion;Impaired flexibility;Increased edema;Pain;Decreased scar mobility;Decreased strength   Rehab Potential Good   OT Frequency 2x / week   OT Duration 2 weeks   OT Treatment/Interventions Self-care/ADL training;Parrafin;Ultrasound;Moist Heat;Therapeutic exercise;Manual Therapy;Scar mobilization;Passive range of motion;Splinting;Patient/family education   OT Home Exercise Plan See pt instruction    Consulted and Agree with Plan of Care Patient        Problem List Patient Active Problem List   Diagnosis Date Noted  . Low hemoglobin 10/15/2014  . Vitamin D deficiency 10/15/2014  . Hyperlipidemia 10/15/2014  . Triggering of digit 02/03/2013    Rosalyn Gess OTR/L, CLT  11/13/2014, 10:54 AM  [place] [facility] [address] [place], [address] Phone: [phone]   Fax:  [phone]

## 2014-11-15 ENCOUNTER — Ambulatory Visit: Payer: Commercial Managed Care - HMO | Admitting: Occupational Therapy

## 2014-11-15 ENCOUNTER — Other Ambulatory Visit: Payer: Self-pay | Admitting: Internal Medicine

## 2014-11-15 ENCOUNTER — Encounter: Payer: Commercial Managed Care - HMO | Admitting: Occupational Therapy

## 2014-11-15 MED ORDER — ATORVASTATIN CALCIUM 10 MG PO TABS: 10.0000 mg | ORAL_TABLET | Freq: Every day | ORAL | Status: DC

## 2014-11-15 NOTE — Telephone Encounter (Signed)
Medication has been refilled and sent to [facility] 

## 2014-11-20 ENCOUNTER — Ambulatory Visit: Payer: Commercial Managed Care - HMO | Admitting: Occupational Therapy

## 2014-11-20 DIAGNOSIS — M6281 Muscle weakness (generalized): Secondary | ICD-10-CM

## 2014-11-20 DIAGNOSIS — M25641 Stiffness of right hand, not elsewhere classified: Secondary | ICD-10-CM | POA: Diagnosis not present

## 2014-11-20 DIAGNOSIS — M79641 Pain in right hand: Secondary | ICD-10-CM | POA: Diagnosis not present

## 2014-11-20 DIAGNOSIS — M25541 Pain in joints of right hand: Secondary | ICD-10-CM

## 2014-11-20 NOTE — Patient Instructions (Signed)
Same as last time  But after PIP extention PROM in lumbrical fist - to do AROM of PIP extention with force proximal phalanges during lumbrical fist Add PIP extention and 3rd digit extention with green putty

## 2014-11-20 NOTE — Therapy (Signed)
[place] [facility] [address] [place], [address] Phone: [phone]   Fax:  [phone]  Occupational Therapy Treatment and discharge  Patient Details  Name: Stacey Munoz MRN: [id number] Date of Birth: 08-11-1945 Referring Provider:  Hessie Knows, MD  Encounter Date: 11/20/2014      OT End of Session - 11/20/14 2120    Visit Number 7   Number of Visits 7   Date for OT Re-Evaluation 11/20/14   OT Start Time 0920   OT Stop Time 1014   OT Time Calculation (min) 54 min   Activity Tolerance Patient tolerated treatment well   Behavior During Therapy [facility] for tasks assessed/performed      Past Medical History  Diagnosis Date  . Low serum vitamin D   . Hyperlipidemia     Past Surgical History  Procedure Laterality Date  . Tubal ligation    . Wrist fracture surgery    . Trigger finger release Right May 2016.    There were no vitals filed for this visit.  Visit Diagnosis:  Pain in joint of right hand - Plan: Ot plan of care cert/re-cert  Stiffness of finger joint of right hand - Plan: Ot plan of care cert/re-cert  Muscle weakness - Plan: Ot plan of care cert/re-cert      Subjective Assessment - 11/20/14 2112    Subjective  I am doing okay - using it more - still stiff in the am and some swelling at the base of the finger - but scar is good - I  do not use scar pad and did not feel any pulling when you did it the last 2 or 3 times    Patient Stated Goals I do not want scar tissue like my L wrist fracture the time - want to make fist , no scar tissue and use it  without issues   Currently in Pain? Yes   Pain Score 1    Pain Location Finger (Comment which one)   Pain Orientation Right   Pain Descriptors / Indicators Sore   Pain Type Other (Comment)   Pain Onset More than a month ago   Pain Frequency Occasional            [facility] OT Assessment - 11/20/14 0001    Strength   Right Hand Grip (lbs)  44   Right Hand Lateral Pinch 16 lbs   Right Hand 3 Point Pinch 15 lbs   Left Hand Grip (lbs) 45   Left Hand Lateral Pinch 17 lbs   Left Hand 3 Point Pinch 15 lbs   Right Hand AROM   R Long  MCP 0-90 90 Degrees   R Long PIP 0-100 92 Degrees  -10 arrive   R Long DIP 0-70 66 Degrees                  OT Treatments/Exercises (OP) - 11/20/14 0001    Hand Exercises   Other Hand Exercises PROM of lumbrical stretch , and then place and hold , AROM with force proximal to dorsal proximal phalanges - ,measurements taken and PRWHE last visit    Other Hand Exercises add putty for PIP extention and 3rd digit extention - using green putty after lumbrical  fist and PIP exention    Ultrasound   Ultrasound Location palmar scar and pro phalanges of 3rd    Ultrasound Parameters [place] at 3.3 MHZ and 1.0 intensity ; 20 % 4 min  Ultrasound Goals Edema   RUE Paraffin   Number Minutes Paraffin 10 Minutes   RUE Paraffin Location Hand   Comments LMB splint on at [facility] to increase PIP extention - did increase to 0 , but -5 in lumbrical    Splinting   Splinting LMB splint on 3rd  PIP to use at home prior to PROM and AAROM of PIP extention    Manual Therapy   Manual therapy comments scar massage done - no pulling this date - no pulling                 OT Education - 2014/12/20 07/22/2117    Education provided Yes   Education Details HEP   Person(s) Educated Patient   Methods Explanation;Demonstration;Tactile cues;Verbal cues   Comprehension Verbalized understanding;Returned demonstration;Verbal cues required          OT Short Term Goals - December 20, 2014 2123    OT SHORT TERM GOAL #1   Title Pt's flexion of 3rd digit  to make full fist  to hold onto knife to cut without increase symptoms   Status Achieved   OT SHORT TERM GOAL #2   Title Pain on PRWHE improve by 10-15 points    Status Achieved   OT SHORT TERM GOAL #3   Title Extention to WNL to put hand in pocket    Baseline -5 to -10   Status  Partially Met           OT Long Term Goals - 12-20-14 07-22-21    OT LONG TERM GOAL #1   Title Funciton on PRWHE improve by 10 points at least   Status Achieved   OT LONG TERM GOAL #2   Title 3 point pinch improve with 3 lbs to improve functional use of R hand without increase pain    Status Achieved               Plan - December 20, 2014 07-23-19    Clinical Impression Statement Pt made great progress from [facility] in scar tissue, pain and function on PRHWE outcome measure - grip increased greatly and prehension this date too - pt had HEP to do PIP extention in lubrical fist , and strengthning to increase and maintain - pt out of town  for about week and then appt with MD - discharge at this time with HEP    Rehab Potential Good   OT Treatment/Interventions Self-care/ADL training;Parrafin;Ultrasound;Moist Heat;Therapeutic exercise;Manual Therapy;Scar mobilization;Passive range of motion;Splinting;Patient/family education   Plan discharge with HEP   OT Home Exercise Plan See pt instruction    Consulted and Agree with Plan of Care Patient          G-Codes - 2014/12/20 07-22-2122    Functional Assessment Tool Used PRWHE, ROM , strength, clinical judgement   Functional Limitation Self care   Self Care Current Status (U0454) At least 1 percent but less than 20 percent impaired, limited or restricted   Self Care Goal Status (U9811) At least 1 percent but less than 20 percent impaired, limited or restricted   Self Care Discharge Status [id number]) At least 1 percent but less than 20 percent impaired, limited or restricted      Problem List Patient Active Problem List   Diagnosis Date Noted  . Low hemoglobin 10/15/2014  . Vitamin D deficiency 10/15/2014  . Hyperlipidemia 10/15/2014  . Triggering of digit 02/03/2013    Rosalyn Gess OTR/L,CLT 20-Dec-2014, 9:27 PM  [place] [facility] [address]  [place], [place], [address] Phone: [phone]    Fax:  [phone]

## 2014-11-21 ENCOUNTER — Telehealth: Payer: Self-pay | Admitting: Internal Medicine

## 2014-11-21 NOTE — Telephone Encounter (Signed)
Pt brought in note on 11/06/14 stating the following information:  Prescribe 5mg /day of statin drug [facility] will offer co-pay for the following : - atorvastatin 10mg  (lowest dosage) - lovastatin 10mg  (lowest dosage) - pravastatin 10mg  (lowest dosage) - simvastatin 5mg  (lowest dosage) All these are mail order drugs  Dr. Kyla Balzarine put patient on nabumetone 750 mg twice daily beginning 11/05/14 to reduce inflammation at surgery site in right palm & finger.  Pt purcharsed and began taking Solaray, Red Rice Yeast, & CoQ10 once a day beginning 11/05/14 to prepare for being on statin drug.  Niacin- 50 mg Co Q10-30 mg Red Rice Yeast- 600mg 

## 2014-12-03 DIAGNOSIS — Z9889 Other specified postprocedural states: Secondary | ICD-10-CM | POA: Diagnosis not present

## 2015-01-10 ENCOUNTER — Encounter: Payer: Self-pay | Admitting: Internal Medicine

## 2015-01-10 ENCOUNTER — Other Ambulatory Visit: Payer: Self-pay | Admitting: Family Medicine

## 2015-01-10 DIAGNOSIS — E559 Vitamin D deficiency, unspecified: Secondary | ICD-10-CM

## 2015-01-10 DIAGNOSIS — E7800 Pure hypercholesterolemia, unspecified: Secondary | ICD-10-CM

## 2015-01-11 ENCOUNTER — Other Ambulatory Visit: Payer: Self-pay | Admitting: Family Medicine

## 2015-01-11 DIAGNOSIS — E559 Vitamin D deficiency, unspecified: Secondary | ICD-10-CM | POA: Diagnosis not present

## 2015-01-12 LAB — LIPID PANEL
CHOL/HDL RATIO: 3.1 ratio (ref 0.0–4.4)
Cholesterol, Total: 190 mg/dL (ref 100–199)
HDL: 62 mg/dL (ref >39)
LDL CALC: 99 mg/dL (ref 0–99)
Triglycerides: 144 mg/dL (ref 0–149)
VLDL Cholesterol Cal: 29 mg/dL (ref 5–40)

## 2015-01-12 LAB — VITAMIN D 25 HYDROXY (VIT D DEFICIENCY, FRACTURES): VIT D 25 HYDROXY: 33.7 ng/mL (ref 30.0–100.0)

## 2015-01-16 ENCOUNTER — Ambulatory Visit: Payer: Commercial Managed Care - HMO | Admitting: Family Medicine

## 2015-01-22 ENCOUNTER — Ambulatory Visit (INDEPENDENT_AMBULATORY_CARE_PROVIDER_SITE_OTHER): Payer: Commercial Managed Care - HMO | Admitting: Family Medicine

## 2015-01-22 ENCOUNTER — Encounter: Payer: Self-pay | Admitting: Family Medicine

## 2015-01-22 ENCOUNTER — Telehealth: Payer: Self-pay | Admitting: Internal Medicine

## 2015-01-22 VITALS — BP 108/72 | HR 90 | Temp 98.0°F | Resp 17 | Ht 65.0 in | Wt 134.9 lb

## 2015-01-22 DIAGNOSIS — E785 Hyperlipidemia, unspecified: Secondary | ICD-10-CM | POA: Diagnosis not present

## 2015-01-22 DIAGNOSIS — H9192 Unspecified hearing loss, left ear: Secondary | ICD-10-CM

## 2015-01-22 DIAGNOSIS — E559 Vitamin D deficiency, unspecified: Secondary | ICD-10-CM

## 2015-01-22 DIAGNOSIS — H9190 Unspecified hearing loss, unspecified ear: Secondary | ICD-10-CM | POA: Insufficient documentation

## 2015-01-22 MED ORDER — ROSUVASTATIN CALCIUM 5 MG PO TABS: 5.0000 mg | ORAL_TABLET | Freq: Every day | ORAL | Status: DC

## 2015-01-22 NOTE — Progress Notes (Signed)
Name: Stacey Munoz   MRN: [id number]    DOB: [birth year]   Date:01/22/2015       Progress Note  Subjective  Chief Complaint  Chief Complaint  Patient presents with  . Follow-up    3 mo Discuss Audiologist  . Hyperlipidemia  . Anemia    Hyperlipidemia This is a chronic problem. The problem is controlled. Recent lipid tests were reviewed and are normal. Pertinent negatives include no chest pain, leg pain, myalgias or shortness of breath. Current antihyperlipidemic treatment includes statins.   Decreased Hearing Pt. With history of gradual hearing difficulty, over 4-5 years ago, first noticed with tonal language in [place]. She has difficulty hearing when there is ambient noise in the background. No history of frequent ear infections. She requests a referral to Audiology.  Past Medical History  Diagnosis Date  . Low serum vitamin D   . Hyperlipidemia     Past Surgical History  Procedure Laterality Date  . Tubal ligation    . Wrist fracture surgery    . Trigger finger release Right May 2016.    Family History  Problem Relation Age of Onset  . Cancer Mother   . Diabetes Father   . Cancer Father   . Heart disease Brother     Social History   Social History  . Marital Status: Unknown    Spouse Name: N/A  . Number of Children: N/A  . Years of Education: N/A   Occupational History  . Not on file.   Social History Main Topics  . Smoking status: Never Smoker   . Smokeless tobacco: Not on file  . Alcohol Use: 0.6 oz/week    0 Standard drinks or equivalent, 1 Glasses of wine per week  . Drug Use: No  . Sexual Activity: Not on file   Other Topics Concern  . Not on file   Social History Narrative    Current outpatient prescriptions:  .  calcium-vitamin D (SM CALCIUM 500/VITAMIN D3) 500-400 MG-UNIT per tablet, Take by mouth., Disp: , Rfl:  .  Coenzyme Q10 100 MG capsule, Take by mouth., Disp: , Rfl:  .  Flaxseed-Eve Prim-Borage (FLAX OIL XTRA) CAPS, Take by  mouth., Disp: , Rfl:  .  Multiple Vitamin (MULTI-VITAMINS) TABS, Take by mouth., Disp: , Rfl:  .  rosuvastatin (CRESTOR) 5 MG tablet, Take 1 tablet (5 mg total) by mouth daily., Disp: 90 tablet, Rfl: 0  No Known Allergies   Review of Systems  HENT: Positive for hearing loss. Negative for ear discharge, ear pain and tinnitus.   Respiratory: Negative for shortness of breath.   Cardiovascular: Negative for chest pain.  Musculoskeletal: Positive for back pain and joint pain. Negative for myalgias.    Objective  Filed Vitals:   01/22/15 0936  BP: 108/72  Pulse: 90  Temp: 98 F (36.7 C)  TempSrc: Oral  Resp: 17  Height: 5\' 5"  (1.651 m)  Weight: 134 lb 14.4 oz (61.19 kg)  SpO2: 94%    Physical Exam  Constitutional: She is well-developed, well-nourished, and in no distress.  HENT:  Right Ear: External ear and ear canal normal.  Left Ear: External ear and ear canal normal.  TM not visualized due to tortuous canal  Cardiovascular: Normal rate and regular rhythm.   Pulmonary/Chest: Effort normal and breath sounds normal.  Abdominal: Soft. Bowel sounds are normal.  Nursing note and vitals reviewed.  Assessment & Plan  1. Hyperlipidemia Fasting lipid panel is at goal. Reviewed with patient  and reassured. - rosuvastatin (CRESTOR) 5 MG tablet; Take 1 tablet (5 mg total) by mouth daily.  Dispense: 90 tablet; Refill: 0 - Comprehensive Metabolic Panel (CMET)  2. Diminished hearing, left Referral to ENT/audiology for evaluation of diminished hearing. - Ambulatory referral to Audiology  3. Vitamin D deficiency Resolved. Patient encouraged to continue taking OTC vitamin D.  Syed Asad A. [facility] 01/22/2015 9:56 AM

## 2015-01-22 NOTE — Telephone Encounter (Signed)
Patient would like for you to make corrections in her chart. She would like for you to look in the section that says, "care team provider" the wrong dr name is there. It has Geri Seminole and that is not correct. Also, in the medication area it has Flax Oil capsule. Please correct, it is not a capsule it is in liquid form.  Thank you

## 2015-01-23 LAB — COMPREHENSIVE METABOLIC PANEL
ALT: 20 IU/L (ref 0–32)
AST: 22 IU/L (ref 0–40)
Albumin/Globulin Ratio: 1.8 (ref 1.1–2.5)
Albumin: 4.8 g/dL (ref 3.6–4.8)
Alkaline Phosphatase: 90 IU/L (ref 39–117)
BUN/Creatinine Ratio: 13 (ref 11–26)
BUN: 11 mg/dL (ref 8–27)
Bilirubin Total: 0.9 mg/dL (ref 0.0–1.2)
CALCIUM: 10.8 mg/dL — AB (ref 8.7–10.3)
CO2: 25 mmol/L (ref 18–29)
CREATININE: 0.82 mg/dL (ref 0.57–1.00)
Chloride: 101 mmol/L (ref 97–108)
GFR calc Af Amer: 84 mL/min/{1.73_m2} (ref >59)
GFR, EST NON AFRICAN AMERICAN: 73 mL/min/{1.73_m2} (ref >59)
GLUCOSE: 96 mg/dL (ref 65–99)
Globulin, Total: 2.6 g/dL (ref 1.5–4.5)
POTASSIUM: 4.6 mmol/L (ref 3.5–5.2)
Sodium: 143 mmol/L (ref 134–144)
Total Protein: 7.4 g/dL (ref 6.0–8.5)

## 2015-01-24 NOTE — Telephone Encounter (Signed)
Patient asked for flax oil capsule to be correct to flax oil liquid form, corrections have been made, also would like provider section changed to Dr. Manuella Ghazi

## 2015-01-31 ENCOUNTER — Telehealth: Payer: Self-pay | Admitting: Internal Medicine

## 2015-01-31 ENCOUNTER — Telehealth: Payer: Self-pay | Admitting: Family Medicine

## 2015-01-31 MED ORDER — ATORVASTATIN CALCIUM 10 MG PO TABS: 10.0000 mg | ORAL_TABLET | Freq: Every day | ORAL | Status: DC

## 2015-01-31 NOTE — Telephone Encounter (Signed)
Medication has been refilled and sent to [facility] per Dr. Manuella Ghazi

## 2015-01-31 NOTE — Telephone Encounter (Signed)
Errorneous entry

## 2015-02-01 ENCOUNTER — Encounter: Payer: Self-pay | Admitting: Family Medicine

## 2015-02-28 ENCOUNTER — Ambulatory Visit: Payer: Commercial Managed Care - HMO | Attending: Audiology | Admitting: Audiology

## 2015-02-28 DIAGNOSIS — R94128 Abnormal results of other function studies of ear and other special senses: Secondary | ICD-10-CM | POA: Insufficient documentation

## 2015-02-28 DIAGNOSIS — Z01118 Encounter for examination of ears and hearing with other abnormal findings: Secondary | ICD-10-CM | POA: Diagnosis not present

## 2015-02-28 DIAGNOSIS — H9193 Unspecified hearing loss, bilateral: Secondary | ICD-10-CM | POA: Diagnosis not present

## 2015-02-28 DIAGNOSIS — R292 Abnormal reflex: Secondary | ICD-10-CM | POA: Insufficient documentation

## 2015-02-28 DIAGNOSIS — H903 Sensorineural hearing loss, bilateral: Secondary | ICD-10-CM

## 2015-02-28 DIAGNOSIS — H93293 Other abnormal auditory perceptions, bilateral: Secondary | ICD-10-CM | POA: Diagnosis not present

## 2015-02-28 NOTE — Patient Instructions (Addendum)
.       Amplification helps make the signal louder and therefore often improves hearing and word recognition.  Amplification has many forms including hearing aids in one or both ears, an assistive listening device which have a microphone and speaker such as a small handheld device and/or even a surround sound system of speakers.  Amplification may be covered by some insurances, but not all.  It is important to note that hearing aids must be individually fit according to the hearing test results and the ear shape.  Audiologists and hearing aid dealers in [place] must be licensed in order to dispense hearing aids.  In addition, a trial period is mandated by law in our state because often amplification must be tried and then evaluated in order to determine benefit.       There are many excellent choices when it comes to amplification in our area and providers are listed in the phone book under hearing aids, may be affiliated with Ear, [place] and Throat physicians, are located at [employer] and [employer].  [facility]  Strategies that help improve hearing include: A) Face the speaker directly. Optimal is having the speakers face well - lit.  Unless amplified, being within 3-6 feet of the speaker will enhance word recognition. B) Avoid having the speaker back-lit as this will minimize the ability to use cues from lip-reading, facial expression and gestures. C)  Word recognition is poorer in background noise. For optimal word recognition, turn off the TV, radio or noisy fan when engaging in conversation. In a restaurant, try to sit away from noise sources and close to the primary speaker.  D)  Ask for topic clarification from time to time in order to remain in the conversation.  Most people don't mind repeating or clarifying a point when asked.  If needed, explain the difficulty hearing in background noise or hearing loss.   Auditory processing self-help computer programs are now available for IPAD and  computer download.  Benefit has been shown with intensive use for 10-15 minutes,  4-5 days per week. Research is suggesting that using the programs for a short amount of time each day is better for the auditory processing development than completing the program in a short amount of time by doing it several hours per day. Auditory Workout          IPAD only from [place] ($24) Hearbuilder.com  IPAD or PC download  ($59) (Start with Phonological Awareness for decoding issues-which is the largest, most intensive program in this set.  Once Phonological Awareness is completed continue auditory processing work with the other [employer] programs: Auditory memory, Following Directions and Sequencing using the same 10-15 minutes, 4-5 days per week)                LACE  (teenage to adult)     PC download or cd  ($99)                                                    www.neurotone.com

## 2015-02-28 NOTE — Procedures (Signed)
Outpatient Audiology and [place]  [place],  [id number]  [phone]   Audiological Evaluation  Patient Name: Stacey Munoz   Status: Outpatient   DOB: [birth year]    Diagnosis: Diminished hearing MRN: [id number] Date:  02/28/2015     Referent: Geri Seminole, MD  History: Stacey Munoz was seen for an audiological evaluation.  Her primary concerns are "poor hearing in ambient noise, difficulty hearing grandchildren and difficulty hearing high pitched soft sounds".  Stacey Munoz reports that the change in airplane pressure actually "improves her hearing - especially on the left side".  Stacey Munoz saw and "ENT about 5 years ago" and was told that the "left ear canal is smaller than the right".  She has noticed her hearing getting "gradually worse since then".  Stacey Munoz denies tinnitus or balance issues and is very active, recently returning from working in [place] with the [employer].states that she wears hearing protection around loud sounds. 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   Evaluation: Conventional pure tone audiometry from 250Hz  - 8000Hz  with using insert earphones and pure tones.  Hearing Thresholds are symmetrical and are 20-3 dBHL at 250Hz ; 15 dBHL at 500Hz ; 20 dBHL at 1000Hz ; 21-30 dBHL at 2000hz ; 40-50dBHL at 4000Hz  and 60-70dBHL at 8000Hz .  The hearing loss appears sensorineural bilaterally.Reliability is good Speech reception levels (repeating words near threshold) using recorded spondee word lists:  Right ear: 25 dBHL.  Left ear:  25 dBHL Word recognition (at comfortably loud volumes) using recorded word lists  in quiet.  Right ear: 100% at 60 dBHL.  Left ear:   100% at 60 dBHL. Word recognition in minimal background noise:  +5 dBHL  Right ear: 56%                              Left ear:  76%  Tympanometry (middle ear function) with ipsilateral acoustic reflexes. Middle ear  pressure, volume and compliance are within normal limits bilaterally (Type A).  Ipsilateral acoustic reflexes are present and within normal limits from 500Hz  - 2000Hz  and are elevated or absent bilaterally at 4000Hz . Eustachian tube function is questionable on the left side by history.  CONCLUSION:      Stacey Munoz has normal hearing in the low frequencies sloping from a mild to moderately severe high frequency sensorineural hearing loss bilaterally. Word recognition is excellent in quiet at conversational speech levels bilaterally. In minimal background noise, word recognition becomes much more difficult and "frustrating" bilaterally. The left ear was tested first, but Stacey Munoz reported that "half way through the testing of the left ear and throughout all of the right ear testing she felt very frustrated" with 56% in the right ear and 76% in the left ear with +5dB signal to noise ratio using multitaker noise and PBK word lists. There is no sign of retrocochlear issues with negative acoustic reflex decay bilaterally. Stacey Munoz may be an excellent candidate and benefit from amplification; therefore a hearing aid evaluation was recommended.  RECOMMENDATIONS: 1.   A hearing aid evaluation at the location of her choice. Several possibilities were discussed. 2.    Strategies that help improve hearing include: A) Face the speaker directly. Optimal is having the speakers face well - lit.  Unless amplified, being within 3-6 feet of the speaker will enhance word recognition. B) Avoid having the speaker back-lit as this will minimize  the ability to use cues from lip-reading, facial expression and gestures. C)  Word recognition is poorer in background noise. For optimal word recognition, turn off the TV, radio or noisy fan when engaging in conversation. In a restaurant, try to sit away from noise sources and close to the primary speaker.  D)  Ask for topic clarification from time to time in order to remain in the  conversation.  Most people don't mind repeating or clarifying a point when asked.  If needed, explain the difficulty hearing in background noise or hearing loss. 3.   Use hearing protection during noisy activities such as using a weed eater, moving the lawn, shooting, etc.    Musician's plugs, are available from [employer]com for music related hearing protection because there is no distortion.  Other hearing protection, such as sponge plugs (available at pharmacies) or earmuffs (available at [employer] or [employer]) are useful for noisy activities and venues. 4.   Amplification helps make the signal louder and therefore often improves hearing and word recognition.  Amplification has many forms including hearing aids in one or both ears, an assistive listening device which have a microphone and speaker such as a small handheld device and/or even a surround sound system of speakers.  Amplification may be covered by some insurances, but not all.  It is important to note that hearing aids must be individually fit according to the hearing test results and the ear shape.  Audiologists and hearing aid dealers in [place] must be licensed in order to dispense hearing aids.  In addition, a trial period is mandated by law in our state because often amplification must be tried and then evaluated in order to determine benefit. There are many excellent choices when it comes to amplification in our area and providers are listed in the phone book under hearing aids, may be affiliated with Ear, [place] and Throat physicians, are located at [employer] and [employer].  Deborah L. Heide Spark, Au.D., CCC-A Doctor of Audiology 02/28/2015  cc: Geri Seminole, MD

## 2015-03-11 ENCOUNTER — Encounter: Payer: Self-pay | Admitting: Family Medicine

## 2015-04-24 ENCOUNTER — Telehealth: Payer: Self-pay | Admitting: Internal Medicine

## 2015-04-24 NOTE — Telephone Encounter (Signed)
PT HAS UP COMING APPT AND IS ASKING FOR A LAB SLIP SO IT CAN BE DONE BEFORE APPT ON JAN 12

## 2015-04-26 NOTE — Telephone Encounter (Signed)
Routed to Dr. Manuella Ghazi for Lab orders

## 2015-04-30 ENCOUNTER — Other Ambulatory Visit: Payer: Self-pay | Admitting: Family Medicine

## 2015-04-30 DIAGNOSIS — E7800 Pure hypercholesterolemia, unspecified: Secondary | ICD-10-CM

## 2015-04-30 DIAGNOSIS — E559 Vitamin D deficiency, unspecified: Secondary | ICD-10-CM

## 2015-04-30 DIAGNOSIS — E785 Hyperlipidemia, unspecified: Secondary | ICD-10-CM

## 2015-04-30 NOTE — Telephone Encounter (Signed)
Pt. Needs Fasting Lipid panel and Comprehensive Metabolic Panel repeated. Lab slip may be printed for these two tests.

## 2015-05-02 ENCOUNTER — Ambulatory Visit: Payer: Self-pay | Admitting: Family Medicine

## 2015-05-07 DIAGNOSIS — E7800 Pure hypercholesterolemia, unspecified: Secondary | ICD-10-CM | POA: Diagnosis not present

## 2015-05-08 LAB — LIPID PANEL
Chol/HDL Ratio: 2.8 ratio units (ref 0.0–4.4)
Cholesterol, Total: 185 mg/dL (ref 100–199)
HDL: 66 mg/dL (ref >39)
LDL CALC: 95 mg/dL (ref 0–99)
Triglycerides: 122 mg/dL (ref 0–149)
VLDL CHOLESTEROL CAL: 24 mg/dL (ref 5–40)

## 2015-05-08 LAB — VITAMIN D 25 HYDROXY (VIT D DEFICIENCY, FRACTURES): Vit D, 25-Hydroxy: 29.2 ng/mL — ABNORMAL LOW (ref 30.0–100.0)

## 2015-05-09 ENCOUNTER — Ambulatory Visit (INDEPENDENT_AMBULATORY_CARE_PROVIDER_SITE_OTHER): Payer: Self-pay | Admitting: Family Medicine

## 2015-05-09 VITALS — BP 108/77 | HR 83 | Temp 97.6°F | Resp 16 | Ht 65.0 in | Wt 136.1 lb

## 2015-05-09 DIAGNOSIS — E785 Hyperlipidemia, unspecified: Secondary | ICD-10-CM

## 2015-05-09 MED ORDER — PRAVASTATIN SODIUM 20 MG PO TABS: 20.0000 mg | ORAL_TABLET | Freq: Every day | ORAL | Status: DC

## 2015-05-09 NOTE — Progress Notes (Signed)
Name: Stacey Munoz   MRN: [id number]    DOB: 1946/01/31   Date:05/09/2015       Progress Note  Subjective  Chief Complaint  Chief Complaint  Patient presents with  . Medication Refill    follow-up  . Hyperlipidemia    stiff muscles in am    Hyperlipidemia This is a chronic problem. The problem is controlled. Recent lipid tests were reviewed and are normal. Pertinent negatives include no chest pain, leg pain or myalgias (complains of morning stiffness, mostly in legs and lower back, started after she was put on Lipitor.). Current antihyperlipidemic treatment includes statins.    Past Medical History  Diagnosis Date  . Low serum vitamin D   . Hyperlipidemia     Past Surgical History  Procedure Laterality Date  . Tubal ligation    . Wrist fracture surgery    . Trigger finger release Right May 2016.    Family History  Problem Relation Age of Onset  . Cancer Mother   . Diabetes Father   . Cancer Father   . Heart disease Brother     Social History   Social History  . Marital Status: Unknown    Spouse Name: N/A  . Number of Children: N/A  . Years of Education: N/A   Occupational History  . Not on file.   Social History Main Topics  . Smoking status: Never Smoker   . Smokeless tobacco: Not on file  . Alcohol Use: 0.6 oz/week    0 Standard drinks or equivalent, 1 Glasses of wine per week  . Drug Use: No  . Sexual Activity: Not on file   Other Topics Concern  . Not on file   Social History Narrative     Current outpatient prescriptions:  .  atorvastatin (LIPITOR) 10 MG tablet, Take 1 tablet (10 mg total) by mouth daily., Disp: 90 tablet, Rfl: 0 .  calcium-vitamin D (SM CALCIUM 500/VITAMIN D3) 500-400 MG-UNIT per tablet, Take by mouth., Disp: , Rfl:  .  Coenzyme Q10 100 MG capsule, Take by mouth., Disp: , Rfl:  .  Flaxseed-Eve Prim-Borage (FLAX OIL XTRA) CAPS, Take by mouth., Disp: , Rfl:  .  Multiple Vitamin (MULTI-VITAMINS) TABS, Take by mouth., Disp:  , Rfl:   No Known Allergies   Review of Systems  Cardiovascular: Negative for chest pain.  Musculoskeletal: Negative for myalgias (complains of morning stiffness, mostly in legs and lower back, started after she was put on Lipitor.).    Objective  Filed Vitals:   05/09/15 1126  BP: 108/77  Pulse: 83  Temp: 97.6 F (36.4 C)  TempSrc: Oral  Resp: 16  Height: 5\' 5"  (1.651 m)  Weight: 136 lb 1.6 oz (61.735 kg)  SpO2: 97%    Physical Exam  Constitutional: She is oriented to person, place, and time and well-developed, well-nourished, and in no distress.  Cardiovascular: Normal rate, regular rhythm and normal heart sounds.   Pulmonary/Chest: Effort normal and breath sounds normal.  Abdominal: Soft. Bowel sounds are normal.  Musculoskeletal: She exhibits no edema or tenderness.  Neurological: She is alert and oriented to person, place, and time.  Psychiatric: Mood, memory, affect and judgment normal.  Nursing note and vitals reviewed.    Recent Results (from the past 2160 hour(s))  Lipid Profile     Status: None   Collection Time: 05/07/15  8:38 AM  Result Value Ref Range   Cholesterol, Total 185 100 - 199 mg/dL   Triglycerides 122 0 -  149 mg/dL   HDL 66 >39 mg/dL   VLDL Cholesterol Cal 24 5 - 40 mg/dL   LDL Calculated 95 0 - 99 mg/dL   Chol/HDL Ratio 2.8 0.0 - 4.4 ratio units    Comment:                                   T. Chol/HDL Ratio                                             Men  Women                               1/2 Avg.Risk  3.4    3.3                                   Avg.Risk  5.0    4.4                                2X Avg.Risk  9.6    7.1                                3X Avg.Risk 23.4   11.0   VITAMIN D 25 Hydroxy (Vit-D Deficiency, Fractures)     Status: Abnormal   Collection Time: 05/07/15  8:38 AM  Result Value Ref Range   Vit D, 25-Hydroxy 29.2 (L) 30.0 - 100.0 ng/mL    Comment: Vitamin D deficiency has been defined by the [place] practice guideline as a level of serum 25-OH vitamin D less than 20 ng/mL (1,2). The Endocrine Society went on to further define vitamin D insufficiency as a level between 21 and 29 ng/mL (2). 1. IOM (Institute of Medicine). 2010. Dietary reference    intakes for calcium and D. [place]: The    [employer]. 2. Holick MF, Binkley [place], Bischoff-Ferrari HA, et al.    Evaluation, treatment, and prevention of vitamin D    deficiency: an Endocrine Society clinical practice    guideline. JCEM. 2011 Jul; 96(7):1911-30.      Assessment & Plan  1. Hyperlipidemia D/C Lipitor 2/2 statin-induced myalgia. Start on Pravastatin and recheck in 3 months. - pravastatin (PRAVACHOL) 20 MG tablet; Take 1 tablet (20 mg total) by mouth daily.  Dispense: 90 tablet; Refill: 1 - Comprehensive Metabolic Panel (CMET)   Stacey Munoz Asad A. [facility] 05/09/2015 11:43 AM

## 2015-05-16 DIAGNOSIS — E785 Hyperlipidemia, unspecified: Secondary | ICD-10-CM | POA: Diagnosis not present

## 2015-05-17 LAB — COMPREHENSIVE METABOLIC PANEL
A/G RATIO: 2 (ref 1.1–2.5)
ALBUMIN: 4.5 g/dL (ref 3.5–4.8)
ALK PHOS: 92 IU/L (ref 39–117)
ALT: 19 IU/L (ref 0–32)
AST: 20 IU/L (ref 0–40)
BILIRUBIN TOTAL: 0.7 mg/dL (ref 0.0–1.2)
BUN / CREAT RATIO: 16 (ref 11–26)
BUN: 12 mg/dL (ref 8–27)
CHLORIDE: 100 mmol/L (ref 96–106)
CO2: 25 mmol/L (ref 18–29)
Calcium: 9.8 mg/dL (ref 8.7–10.3)
Creatinine, Ser: 0.76 mg/dL (ref 0.57–1.00)
GFR calc non Af Amer: 80 mL/min/{1.73_m2} (ref >59)
GFR, EST AFRICAN AMERICAN: 92 mL/min/{1.73_m2} (ref >59)
GLOBULIN, TOTAL: 2.2 g/dL (ref 1.5–4.5)
Glucose: 104 mg/dL — ABNORMAL HIGH (ref 65–99)
POTASSIUM: 4.1 mmol/L (ref 3.5–5.2)
SODIUM: 140 mmol/L (ref 134–144)
TOTAL PROTEIN: 6.7 g/dL (ref 6.0–8.5)

## 2015-05-23 ENCOUNTER — Encounter: Payer: Self-pay | Admitting: Family Medicine

## 2015-05-23 DIAGNOSIS — Z7189 Other specified counseling: Secondary | ICD-10-CM | POA: Insufficient documentation

## 2015-07-11 ENCOUNTER — Telehealth: Payer: Self-pay | Admitting: Internal Medicine

## 2015-07-11 NOTE — Telephone Encounter (Signed)
Routed to Dr. Manuella Ghazi for new referral

## 2015-07-11 NOTE — Telephone Encounter (Signed)
Please obtain records from [facility] for documentation and generation of referral.

## 2015-07-11 NOTE — Telephone Encounter (Signed)
Patient was seen by [facility] on 07-11-15 she is needing a referral to cover this visit. They found the beginning of cateracts.

## 2015-07-23 ENCOUNTER — Encounter: Payer: Self-pay | Admitting: Internal Medicine

## 2015-07-25 ENCOUNTER — Telehealth: Payer: Self-pay | Admitting: Internal Medicine

## 2015-07-25 NOTE — Telephone Encounter (Signed)
Pt has an appt on 07/29/2015 and is supposed to get a lab order before her appt so she can get her labs done. Please advise.

## 2015-07-25 NOTE — Telephone Encounter (Signed)
Spoke with patient and per Dr. Manuella Ghazi there are no labs due at this time and all of patient's medications are refilled so she has rescheduled her appointment for 11/14/2015

## 2015-08-01 ENCOUNTER — Ambulatory Visit: Payer: Self-pay | Admitting: Family Medicine

## 2015-08-08 ENCOUNTER — Ambulatory Visit: Payer: Self-pay | Admitting: Family Medicine

## 2015-10-24 ENCOUNTER — Telehealth: Payer: Self-pay | Admitting: Internal Medicine

## 2015-10-24 NOTE — Telephone Encounter (Signed)
Pt HAS APPT ON  July 12 FOR MED REFILL AND SAYS THAT YOU WILL NEED BLOOD WORK FIRST AND IS ASKING IF YOU COULD HAVE THIS ORDER READY BY July 6TH SO SHE CAN GET THE BLOOD WORK DONE. PLEASE LET PT KNOW WHEN ORDER IS READY.

## 2015-11-06 ENCOUNTER — Encounter: Payer: Self-pay | Admitting: Family Medicine

## 2015-11-06 ENCOUNTER — Ambulatory Visit (INDEPENDENT_AMBULATORY_CARE_PROVIDER_SITE_OTHER): Payer: Commercial Managed Care - HMO | Admitting: Family Medicine

## 2015-11-06 VITALS — BP 136/68 | HR 100 | Temp 97.7°F | Resp 17 | Ht 65.0 in | Wt 135.4 lb

## 2015-11-06 DIAGNOSIS — H524 Presbyopia: Secondary | ICD-10-CM | POA: Diagnosis not present

## 2015-11-06 DIAGNOSIS — E559 Vitamin D deficiency, unspecified: Secondary | ICD-10-CM | POA: Diagnosis not present

## 2015-11-06 DIAGNOSIS — H5210 Myopia, unspecified eye: Secondary | ICD-10-CM | POA: Diagnosis not present

## 2015-11-06 DIAGNOSIS — Z0100 Encounter for examination of eyes and vision without abnormal findings: Secondary | ICD-10-CM | POA: Diagnosis not present

## 2015-11-06 DIAGNOSIS — E785 Hyperlipidemia, unspecified: Secondary | ICD-10-CM

## 2015-11-06 LAB — COMPLETE METABOLIC PANEL WITH GFR
ALBUMIN: 4.5 g/dL (ref 3.6–5.1)
ALK PHOS: 80 U/L (ref 33–130)
ALT: 15 U/L (ref 6–29)
AST: 17 U/L (ref 10–35)
BILIRUBIN TOTAL: 0.9 mg/dL (ref 0.2–1.2)
BUN: 12 mg/dL (ref 7–25)
CO2: 25 mmol/L (ref 20–31)
Calcium: 9.5 mg/dL (ref 8.6–10.4)
Chloride: 106 mmol/L (ref 98–110)
Creat: 0.77 mg/dL (ref 0.60–0.93)
GFR, EST NON AFRICAN AMERICAN: 78 mL/min (ref >=60)
Glucose, Bld: 101 mg/dL — ABNORMAL HIGH (ref 65–99)
Potassium: 4.6 mmol/L (ref 3.5–5.3)
Sodium: 140 mmol/L (ref 135–146)
TOTAL PROTEIN: 6.5 g/dL (ref 6.1–8.1)

## 2015-11-06 LAB — LIPID PANEL
Cholesterol: 215 mg/dL — ABNORMAL HIGH (ref 125–200)
HDL: 64 mg/dL (ref >=46)
LDL CALC: 122 mg/dL (ref <130)
Total CHOL/HDL Ratio: 3.4 Ratio (ref <=5.0)
Triglycerides: 145 mg/dL (ref <150)
VLDL: 29 mg/dL (ref <30)

## 2015-11-06 MED ORDER — PRAVASTATIN SODIUM 20 MG PO TABS: 20.0000 mg | ORAL_TABLET | Freq: Every day | ORAL | Status: DC

## 2015-11-06 NOTE — Progress Notes (Signed)
Name: Stacey Munoz   MRN: [id number]    DOB: [birth year]   Date:11/06/2015       Progress Note  Subjective  Chief Complaint  Chief Complaint  Patient presents with  . Follow-up    3 mo  . Labs Only  . Medication Refill    pravastatin 20 mg     HPI  Hyperlipidemia: Last FLP was obtained in January 2017, she is on Pravastatin 20 mg daily, no side effects including myalgias, liver enzymes are normal. No history of CVD.  Past Medical History  Diagnosis Date  . Low serum vitamin D   . Hyperlipidemia     Past Surgical History  Procedure Laterality Date  . Tubal ligation    . Wrist fracture surgery    . Trigger finger release Right May 2016.    Family History  Problem Relation Age of Onset  . Cancer Mother   . Diabetes Father   . Cancer Father   . Heart disease Brother     Social History   Social History  . Marital Status: Unknown    Spouse Name: N/A  . Number of Children: N/A  . Years of Education: N/A   Occupational History  . Not on file.   Social History Main Topics  . Smoking status: Never Smoker   . Smokeless tobacco: Not on file  . Alcohol Use: 0.6 oz/week    0 Standard drinks or equivalent, 1 Glasses of wine per week  . Drug Use: No  . Sexual Activity: Not on file   Other Topics Concern  . Not on file   Social History Narrative     Current outpatient prescriptions:  .  calcium-vitamin D (SM CALCIUM 500/VITAMIN D3) 500-400 MG-UNIT per tablet, Take by mouth., Disp: , Rfl:  .  Coenzyme Q10 100 MG capsule, Take by mouth., Disp: , Rfl:  .  Flaxseed-Eve Prim-Borage (FLAX OIL XTRA) CAPS, Take by mouth., Disp: , Rfl:  .  Multiple Vitamin (MULTI-VITAMINS) TABS, Take by mouth., Disp: , Rfl:  .  pravastatin (PRAVACHOL) 20 MG tablet, Take 1 tablet (20 mg total) by mouth daily., Disp: 90 tablet, Rfl: 1  No Known Allergies   Review of Systems  Cardiovascular: Negative for chest pain.  Musculoskeletal: Negative for myalgias.      Objective  Filed Vitals:   11/06/15 0854  BP: 136/68  Pulse: 100  Temp: 97.7 F (36.5 C)  TempSrc: Oral  Resp: 17  Height: 5\' 5"  (1.651 m)  Weight: 135 lb 6.4 oz (61.417 kg)  SpO2: 96%    Physical Exam  Constitutional: She is oriented to person, place, and time and well-developed, well-nourished, and in no distress.  HENT:  Head: Normocephalic and atraumatic.  Cardiovascular: Normal rate, regular rhythm and normal heart sounds.   Pulmonary/Chest: Effort normal and breath sounds normal. She has no wheezes.  Abdominal: Soft. Bowel sounds are normal.  Neurological: She is alert and oriented to person, place, and time.  Skin: Skin is warm and dry.  Nursing note and vitals reviewed.     Assessment & Plan  1. Hyperlipidemia  - pravastatin (PRAVACHOL) 20 MG tablet; Take 1 tablet (20 mg total) by mouth daily.  Dispense: 90 tablet; Refill: 1 - Lipid Profile - COMPLETE METABOLIC PANEL WITH GFR  2. Vitamin D deficiency Vitamin D insufficiency, recheck today. - Vitamin D (25 hydroxy)   Orazio Weller Asad A. [facility] 11/06/2015 9:04 AM

## 2015-11-06 NOTE — Telephone Encounter (Signed)
Patient's labs have been completed at office visit on 11/06/2015

## 2015-11-07 LAB — VITAMIN D 25 HYDROXY (VIT D DEFICIENCY, FRACTURES): Vit D, 25-Hydroxy: 34 ng/mL (ref 30–100)

## 2015-11-08 ENCOUNTER — Telehealth: Payer: Self-pay | Admitting: Emergency Medicine

## 2015-11-08 NOTE — Telephone Encounter (Signed)
Patient notfied of labs. New script sent

## 2015-11-14 ENCOUNTER — Ambulatory Visit: Payer: Self-pay | Admitting: Family Medicine

## 2016-03-02 ENCOUNTER — Telehealth: Payer: Self-pay | Admitting: Internal Medicine

## 2016-03-02 NOTE — Telephone Encounter (Signed)
Called Pt to schedule AWV with [facility] for 05/11/16 - knb

## 2016-04-03 ENCOUNTER — Ambulatory Visit (INDEPENDENT_AMBULATORY_CARE_PROVIDER_SITE_OTHER): Payer: Commercial Managed Care - HMO

## 2016-04-03 DIAGNOSIS — Z23 Encounter for immunization: Secondary | ICD-10-CM

## 2016-05-04 ENCOUNTER — Other Ambulatory Visit: Payer: Self-pay | Admitting: Emergency Medicine

## 2016-05-11 ENCOUNTER — Encounter: Payer: Self-pay | Admitting: Family Medicine

## 2016-05-11 ENCOUNTER — Ambulatory Visit (INDEPENDENT_AMBULATORY_CARE_PROVIDER_SITE_OTHER): Payer: Commercial Managed Care - HMO | Admitting: Family Medicine

## 2016-05-11 VITALS — BP 134/63 | HR 87 | Temp 97.6°F | Resp 16 | Ht 65.0 in | Wt 140.1 lb

## 2016-05-11 DIAGNOSIS — Z1231 Encounter for screening mammogram for malignant neoplasm of breast: Secondary | ICD-10-CM | POA: Diagnosis not present

## 2016-05-11 DIAGNOSIS — Z0000 Encounter for general adult medical examination without abnormal findings: Secondary | ICD-10-CM | POA: Diagnosis not present

## 2016-05-11 DIAGNOSIS — Z23 Encounter for immunization: Secondary | ICD-10-CM | POA: Diagnosis not present

## 2016-05-11 DIAGNOSIS — E785 Hyperlipidemia, unspecified: Secondary | ICD-10-CM | POA: Diagnosis not present

## 2016-05-11 DIAGNOSIS — E559 Vitamin D deficiency, unspecified: Secondary | ICD-10-CM | POA: Diagnosis not present

## 2016-05-11 DIAGNOSIS — Z1239 Encounter for other screening for malignant neoplasm of breast: Secondary | ICD-10-CM

## 2016-05-11 LAB — COMPLETE METABOLIC PANEL WITH GFR
ALK PHOS: 76 U/L (ref 33–130)
ALT: 21 U/L (ref 6–29)
AST: 21 U/L (ref 10–35)
Albumin: 4.4 g/dL (ref 3.6–5.1)
BUN: 12 mg/dL (ref 7–25)
CHLORIDE: 106 mmol/L (ref 98–110)
CO2: 24 mmol/L (ref 20–31)
Calcium: 10.1 mg/dL (ref 8.6–10.4)
Creat: 0.72 mg/dL (ref 0.60–0.93)
GFR, EST NON AFRICAN AMERICAN: 85 mL/min (ref >=60)
GFR, Est African American: >89 mL/min (ref >=60)
GLUCOSE: 105 mg/dL — AB (ref 65–99)
POTASSIUM: 4.9 mmol/L (ref 3.5–5.3)
SODIUM: 141 mmol/L (ref 135–146)
Total Bilirubin: 0.7 mg/dL (ref 0.2–1.2)
Total Protein: 7 g/dL (ref 6.1–8.1)

## 2016-05-11 LAB — TSH: TSH: 1.79 mIU/L

## 2016-05-11 LAB — LIPID PANEL
CHOL/HDL RATIO: 3.6 ratio (ref <5.0)
Cholesterol: 244 mg/dL — ABNORMAL HIGH (ref <200)
HDL: 68 mg/dL (ref >50)
LDL Cholesterol: 151 mg/dL — ABNORMAL HIGH (ref <100)
Triglycerides: 126 mg/dL (ref <150)
VLDL: 25 mg/dL (ref <30)

## 2016-05-11 LAB — HEPATITIS C ANTIBODY: HCV AB: NEGATIVE

## 2016-05-11 NOTE — Progress Notes (Signed)
Subjective:   Stacey Munoz is a 71 y.o. female who presents for Medicare Annual (Subsequent) preventive examination.  Review of Systems:    Cardiac Risk Factors include: advanced age (>26mn, >>63women)     Objective:     Vitals: BP 134/63 (BP Location: Right Arm, Patient Position: Sitting, Cuff Size: Normal)   Pulse 87   Temp 97.6 F (36.4 C) (Oral)   Resp 16   Ht _0  (1.651 m)   Wt 140 lb 1.6 oz (63.5 kg)   SpO2 97%   BMI 23.31 kg/m   Body mass index is 23.31 kg/m.   Tobacco History  Smoking Status  . Never Smoker  Smokeless Tobacco  . Never Used     Counseling given: Not Answered   Past Medical History:  Diagnosis Date  . Hyperlipidemia   . Low serum vitamin D    Past Surgical History:  Procedure Laterality Date  . TRIGGER FINGER RELEASE Right May 2016.  . TUBAL LIGATION    . WRIST FRACTURE SURGERY     Family History  Problem Relation Age of Onset  . Diabetes Father   . Cancer Father   . Heart disease Brother    History  Sexual Activity  . Sexual activity: Not on file    Outpatient Encounter Prescriptions as of 05/11/2016  Medication Sig  . calcium-vitamin D (SM CALCIUM 500/VITAMIN D3) 500-400 MG-UNIT per tablet Take by mouth.  . Coenzyme Q10 100 MG capsule Take by mouth.  .Randell LoopPrim-Borage (FLAX OIL XTRA) CAPS Take by mouth.  . Multiple Vitamin (MULTI-VITAMINS) TABS Take by mouth.  . pravastatin (PRAVACHOL) 20 MG tablet Take 20 mg by mouth daily.   No facility-administered encounter medications on file as of 05/11/2016.     Activities of Daily Living In your present state of health, do you have any difficulty performing the following activities: 05/11/2016 05/11/2016  Hearing? Y N  Vision? N Y  Difficulty concentrating or making decisions? Y N  Walking or climbing stairs? N N  Dressing or bathing? N N  Doing errands, shopping? N N  Preparing Food and eating ? N -  Using the Toilet? N -  In the past six months, have you  accidently leaked urine? N -  Do you have problems with loss of bowel control? N -  Managing your Medications? N -  Managing your Finances? N -  Housekeeping or managing your Housekeeping? N -  Some recent data might be hidden    Patient Care Team: SGeri Seminole MD as PCP - General (Internal Medicine)    Assessment:     Exercise Activities and Dietary recommendations Current Exercise Habits: Structured exercise class, Type of exercise: yoga;walking;stretching, Time (Minutes): 60, Frequency (Times/Week): 2, Weekly Exercise (Minutes/Week): 120, Intensity: Mild, Exercise limited by: None identified  Goals    . Weight (lb) < 200 lb (90.7 kg)          Starting 05/11/16, I will work to loss 10 lbs.      Fall Risk Fall Risk  05/11/2016 05/11/2016 11/06/2015 05/09/2015 01/22/2015  Falls in the past year? _1    Depression Screen PHQ 2/9 Scores 05/11/2016 05/11/2016 11/06/2015 05/09/2015  PHQ - 2 Score 1 0 0 0     Cognitive Function        Immunization History  Administered Date(s) Administered  . Encephalitis 05/14/2009, 05/20/2009, 06/10/2009  . Hepatitis A 05/10/2009, 10/23/2009  . Hepatitis B 05/10/2009, 06/17/2009, 10/23/2009  .  IPV 09/09/2007  . Influenza, High Dose Seasonal PF 04/03/2016  . Influenza-Unspecified 06/03/2009, 07/22/2010, 01/25/2014  . MMR 09/09/2007  . Rabies, IM 05/08/2009, 05/14/2009, 05/29/2009  . Td 06/03/2011  . Tdap 06/02/2009  . Typhoid Parenteral, AKD ([place] [employer]) 05/20/2009   Screening Tests Health Maintenance  Topic Date Due  . Hepatitis C Screening  [birth year]  . MAMMOGRAM  01/30/1995  . COLONOSCOPY  01/30/1995  . ZOSTAVAX  01/29/2005  . PNA vac Low Risk Adult (1 of 2 - PCV13) 01/29/2010  . TETANUS/TDAP  06/02/2021  . INFLUENZA VACCINE  Completed  . DEXA SCAN  Completed      Plan:  I have personally reviewed and addressed the Medicare Annual Wellness questionnaire and have noted the following in the patient's chart:   A. Medical and social history B. Use of alcohol, tobacco or illicit drugs  C. Current medications and supplements D. Functional ability and status E.  Nutritional status F.  Physical activity G. Advance directives H. List of other physicians I.  Hospitalizations, surgeries, and ER visits in previous 12 months J.  [place] such as hearing and vision if needed, cognitive and depression L. Referrals and appointments - none  In addition, I have reviewed and discussed with patient certain preventive protocols, quality metrics, and best practice recommendations. A written personalized care plan for preventive services as well as general preventive health recommendations were provided to patient.  See attached scanned questionnaire for additional information.   Signed,  Fabio Neighbors, LPN Nurse Health Advisor  MD Recommendations:

## 2016-05-11 NOTE — Progress Notes (Signed)
Name: CARLEISHA NEWBERY   MRN: [id number]    DOB: 09-25-1945   Date:05/11/2016       Progress Note  Subjective  Chief Complaint  Chief Complaint  Patient presents with  . Annual Exam    CPE  . Medicare Wellness    HPI  Pt. Presents for [employer]. She is doing well. Colonoscopy was 3 years ago, she does know when she will need a repeat colonoscopy. Mammogram was in 2015, will order repeat mammogram today.  Past Medical History:  Diagnosis Date  . Hyperlipidemia   . Low serum vitamin D     Past Surgical History:  Procedure Laterality Date  . TRIGGER FINGER RELEASE Right May 2016.  . TUBAL LIGATION    . WRIST FRACTURE SURGERY      Family History  Problem Relation Age of Onset  . Diabetes Father   . Cancer Father   . Heart disease Brother     Social History   Social History  . Marital status: Unknown    Spouse name: N/A  . Number of children: N/A  . Years of education: N/A   Occupational History  . Not on file.   Social History Main Topics  . Smoking status: Never Smoker  . Smokeless tobacco: Never Used  . Alcohol use 0.6 oz/week    1 Glasses of wine per week     Comment: at the most  . Drug use: No  . Sexual activity: Not on file   Other Topics Concern  . Not on file   Social History Narrative  . No narrative on file     Current Outpatient Prescriptions:  .  calcium-vitamin D (SM CALCIUM 500/VITAMIN D3) 500-400 MG-UNIT per tablet, Take by mouth., Disp: , Rfl:  .  Coenzyme Q10 100 MG capsule, Take by mouth., Disp: , Rfl:  .  Flaxseed-Eve Prim-Borage (FLAX OIL XTRA) CAPS, Take by mouth., Disp: , Rfl:  .  Multiple Vitamin (MULTI-VITAMINS) TABS, Take by mouth., Disp: , Rfl:  .  pravastatin (PRAVACHOL) 20 MG tablet, Take 20 mg by mouth daily., Disp: , Rfl:   No Known Allergies   Review of Systems  Constitutional: Negative for chills, fever and malaise/fatigue.  HENT: Negative for congestion.   Eyes: Negative for blurred vision and  double vision.  Respiratory: Negative for cough, shortness of breath and wheezing.   Cardiovascular: Negative for chest pain and leg swelling.  Gastrointestinal: Negative for abdominal pain and blood in stool.  Genitourinary: Negative for hematuria.  Musculoskeletal: Negative for back pain and neck pain.  Neurological: Negative for headaches.  Psychiatric/Behavioral: Positive for depression (mainly becasue of stress due to legal issues related to her mother). The patient is nervous/anxious. The patient does not have insomnia.     Objective  Vitals:   05/11/16 0852  BP: 134/63  Pulse: 87  Resp: 16  Temp: 97.6 F (36.4 C)  TempSrc: Oral  SpO2: 97%  Weight: 140 lb 1.6 oz (63.5 kg)  Height: 5\' 5"  (1.651 m)    Physical Exam  Constitutional: She is oriented to person, place, and time and well-developed, well-nourished, and in no distress.  HENT:  Head: Normocephalic and atraumatic.  Right Ear: External ear normal.  Left Ear: External ear normal.  Eyes: Conjunctivae are normal. Pupils are equal, round, and reactive to light.  Neck: Normal range of motion. Neck supple. Thyromegaly present.  Cardiovascular: Normal rate, regular rhythm and normal heart sounds.   No murmur heard. Pulmonary/Chest: Effort  normal and breath sounds normal. She has no wheezes.  Abdominal: Soft. Bowel sounds are normal. There is no tenderness.  Musculoskeletal: She exhibits no edema.  Neurological: She is alert and oriented to person, place, and time.  Psychiatric: Mood, memory, affect and judgment normal.  Nursing note and vitals reviewed.     Assessment & Plan  1. Medicare annual wellness visit, subsequent Obtain age-appropriate laboratory screenings, point-of-care A1c is within normal range - Lipid Profile - COMPLETE METABOLIC PANEL WITH GFR - Vitamin D (25 hydroxy) - TSH - POCT HgB A1C - Hepatitis C Antibody  2. Need for pneumococcal vaccination  - Pneumococcal conjugate vaccine  13-valent  3. Breast cancer screening  - MM DIGITAL SCREENING BILATERAL; Future  Kesia Dalto [facility] [facility] 05/11/2016 10:17 AM

## 2016-05-11 NOTE — Patient Instructions (Signed)
  Stacey Munoz , Thank you for taking time to come for your Medicare Wellness Visit. I appreciate your ongoing commitment to your health goals. Please review the following plan we discussed and let me know if I can assist you in the future.   These are the goals we discussed: Goals    . Weight (lb) < 200 lb (90.7 kg)          Starting 05/11/16, I will work to loss 10 lbs.       This is a list of the screening recommended for you and due dates:  Health Maintenance  Topic Date Due  .  Hepatitis C: One time screening is recommended by Center for Disease Control  (CDC) for  adults born from [birth year] through 1966.   February 20, 1946  . Mammogram  01/30/1995  . Colon Cancer Screening  01/30/1995  . Shingles Vaccine  01/29/2005  . Pneumonia vaccines (1 of 2 - PCV13) 01/29/2010  . Tetanus Vaccine  06/02/2021  . Flu Shot  Completed  . DEXA scan (bone density measurement)  Completed

## 2016-05-12 LAB — VITAMIN D 25 HYDROXY (VIT D DEFICIENCY, FRACTURES): VIT D 25 HYDROXY: 29 ng/mL — AB (ref 30–100)

## 2016-05-12 LAB — POCT GLYCOSYLATED HEMOGLOBIN (HGB A1C): HEMOGLOBIN A1C: 5.8

## 2016-05-18 ENCOUNTER — Telehealth: Payer: Self-pay | Admitting: Internal Medicine

## 2016-05-18 ENCOUNTER — Ambulatory Visit
Admission: RE | Admit: 2016-05-18 | Discharge: 2016-05-18 | Disposition: A | Discharge status: Home / Self Care | Payer: Medicare HMO | Source: Ambulatory Visit | Attending: Family Medicine | Admitting: Family Medicine

## 2016-05-18 DIAGNOSIS — Z1231 Encounter for screening mammogram for malignant neoplasm of breast: Secondary | ICD-10-CM | POA: Insufficient documentation

## 2016-05-18 DIAGNOSIS — Z1239 Encounter for other screening for malignant neoplasm of breast: Secondary | ICD-10-CM

## 2016-05-18 NOTE — Telephone Encounter (Signed)
Pt was given test results on last week. However she did not receive her medication refill for pravastatin 20mg . Please send to [facility] mail order

## 2016-05-19 ENCOUNTER — Other Ambulatory Visit: Payer: Self-pay | Admitting: Emergency Medicine

## 2016-05-19 MED ORDER — PRAVASTATIN SODIUM 20 MG PO TABS: 20.0000 mg | ORAL_TABLET | Freq: Every day | ORAL | 0 refills | Status: DC

## 2016-05-19 NOTE — Telephone Encounter (Signed)
Order sent to [employer] 

## 2016-05-19 NOTE — Progress Notes (Unsigned)
90

## 2016-05-28 NOTE — Telephone Encounter (Signed)
Routed note to Dr. Manuella Ghazi, patient is requesting a call back concerning her mother Aleiza Hammitt and a referral for hospice

## 2016-05-28 NOTE — Telephone Encounter (Signed)
Please advise to bring her mother in for an appointment to discuss and document the referral and the indications for hospice

## 2016-07-21 ENCOUNTER — Other Ambulatory Visit: Payer: Self-pay | Admitting: Internal Medicine

## 2016-07-21 MED ORDER — PRAVASTATIN SODIUM 20 MG PO TABS: 20.0000 mg | ORAL_TABLET | Freq: Every day | ORAL | 0 refills | Status: DC

## 2016-07-21 NOTE — Telephone Encounter (Signed)
Pt needs refill on Pravastatin to be sent to [facility].

## 2016-07-21 NOTE — Telephone Encounter (Signed)
Medication has been refilled and sent to [facility]

## 2016-10-22 ENCOUNTER — Other Ambulatory Visit: Payer: Self-pay

## 2016-10-22 NOTE — Telephone Encounter (Signed)
Also needs a Hep A injection [place].

## 2016-11-05 ENCOUNTER — Encounter: Payer: Self-pay | Admitting: Family Medicine

## 2016-11-05 ENCOUNTER — Ambulatory Visit (INDEPENDENT_AMBULATORY_CARE_PROVIDER_SITE_OTHER): Payer: Medicare HMO | Admitting: Family Medicine

## 2016-11-05 VITALS — BP 134/71 | HR 76 | Temp 97.4°F | Resp 16 | Ht 65.0 in | Wt 137.4 lb

## 2016-11-05 DIAGNOSIS — Z23 Encounter for immunization: Secondary | ICD-10-CM

## 2016-11-05 DIAGNOSIS — E7800 Pure hypercholesterolemia, unspecified: Secondary | ICD-10-CM | POA: Diagnosis not present

## 2016-11-05 MED ORDER — PRAVASTATIN SODIUM 20 MG PO TABS: 20.0000 mg | ORAL_TABLET | Freq: Every day | ORAL | 0 refills | Status: DC

## 2016-11-05 NOTE — Progress Notes (Signed)
Name: Stacey Munoz   MRN: [id number]    DOB: Dec 20, 1945   Date:11/05/2016       Progress Note  Subjective  Chief Complaint  Chief Complaint  Patient presents with  . Medication Refill  . Immunizations    Hep Munoz    Hyperlipidemia  This is Munoz chronic problem. The problem is uncontrolled. Recent lipid tests were reviewed and are high. Pertinent negatives include no leg pain, myalgias or shortness of breath. Current antihyperlipidemic treatment includes statins. Risk factors for coronary artery disease include diabetes mellitus.   Patient is traveling to [place] in September, needs Hepatitis Munoz vaccine prior, she has never had hepatitis Munoz, never have received the vaccine.    Past Medical History:  Diagnosis Date  . Hyperlipidemia   . Low serum vitamin D     Past Surgical History:  Procedure Laterality Date  . TRIGGER FINGER RELEASE Right May 2016.  . TUBAL LIGATION    . WRIST FRACTURE SURGERY      Family History  Problem Relation Age of Onset  . Breast cancer Mother 87  . Diabetes Father   . Cancer Father   . Heart disease Brother   . Breast cancer Paternal Grandmother     Social History   Social History  . Marital status: Unknown    Spouse name: N/Munoz  . Number of children: N/Munoz  . Years of education: N/Munoz   Occupational History  . Not on file.   Social History Main Topics  . Smoking status: Never Smoker  . Smokeless tobacco: Never Used  . Alcohol use 0.6 oz/week    1 Glasses of wine per week     Comment: at the most  . Drug use: No  . Sexual activity: Not on file   Other Topics Concern  . Not on file   Social History Narrative  . No narrative on file     Current Outpatient Prescriptions:  .  calcium-vitamin D (SM CALCIUM 500/VITAMIN D3) 500-400 MG-UNIT per tablet, Take by mouth., Disp: , Rfl:  .  Flaxseed-Eve Prim-Borage (FLAX OIL XTRA) CAPS, Take by mouth., Disp: , Rfl:  .  Multiple Vitamin (MULTI-VITAMINS) TABS, Take by mouth., Disp: , Rfl:  .   pravastatin (PRAVACHOL) 20 MG tablet, Take 1 tablet (20 mg total) by mouth daily., Disp: 90 tablet, Rfl: 0 .  Coenzyme Q10 100 MG capsule, Take by mouth., Disp: , Rfl:   No Known Allergies   Review of Systems  Respiratory: Negative for shortness of breath.   Musculoskeletal: Negative for myalgias.     Objective  Vitals:   11/05/16 0839  BP: 134/71  Pulse: 76  Resp: 16  Temp: (!) 97.4 F (36.3 C)  TempSrc: Oral  SpO2: 97%  Weight: 137 lb 6.4 oz (62.3 kg)  Height: 5\' 5"  (1.651 m)    Physical Exam  Constitutional: She is oriented to person, place, and time and well-developed, well-nourished, and in no distress.  HENT:  Head: Normocephalic and atraumatic.  Cardiovascular: Normal rate, regular rhythm and normal heart sounds.   No murmur heard. Pulmonary/Chest: Effort normal and breath sounds normal. She has no wheezes.  Abdominal: Soft. Bowel sounds are normal. There is no tenderness.  Musculoskeletal: She exhibits no edema.  Neurological: She is alert and oriented to person, place, and time.  Psychiatric: Mood, memory, affect and judgment normal.  Nursing note and vitals reviewed.       Assessment & Plan  1. Pure hypercholesterolemia Last FLP in  general 2018 was not at goal with elevated LDL and total cholesterol. Obtain FLP and adjust as appropriate - Lipid panel - COMPLETE METABOLIC PANEL WITH GFR - pravastatin (PRAVACHOL) 20 MG tablet; Take 1 tablet (20 mg total) by mouth daily.  Dispense: 90 tablet; Refill: 0  2. Need for hepatitis Munoz vaccination  - Hepatitis Munoz vaccine adult IM   Stacey Munoz Stacey Munoz. [facility] 11/05/2016 8:50 AM

## 2016-11-09 DIAGNOSIS — E7800 Pure hypercholesterolemia, unspecified: Secondary | ICD-10-CM | POA: Diagnosis not present

## 2016-11-10 LAB — LIPID PANEL
CHOL/HDL RATIO: 3.6 ratio (ref <5.0)
CHOLESTEROL: 251 mg/dL — AB (ref <200)
HDL: 70 mg/dL (ref >50)
LDL Cholesterol: 151 mg/dL — ABNORMAL HIGH (ref <100)
TRIGLYCERIDES: 150 mg/dL — AB (ref <150)
VLDL: 30 mg/dL (ref <30)

## 2016-11-10 LAB — COMPLETE METABOLIC PANEL WITH GFR
ALK PHOS: 72 U/L (ref 33–130)
ALT: 17 U/L (ref 6–29)
AST: 19 U/L (ref 10–35)
Albumin: 4.2 g/dL (ref 3.6–5.1)
BUN: 13 mg/dL (ref 7–25)
CALCIUM: 9.7 mg/dL (ref 8.6–10.4)
CHLORIDE: 103 mmol/L (ref 98–110)
CO2: 27 mmol/L (ref 20–31)
Creat: 0.77 mg/dL (ref 0.60–0.93)
GFR, EST NON AFRICAN AMERICAN: 78 mL/min (ref >=60)
GFR, Est African American: >89 mL/min (ref >=60)
Glucose, Bld: 100 mg/dL — ABNORMAL HIGH (ref 65–99)
POTASSIUM: 4 mmol/L (ref 3.5–5.3)
Sodium: 139 mmol/L (ref 135–146)
Total Bilirubin: 0.9 mg/dL (ref 0.2–1.2)
Total Protein: 6.7 g/dL (ref 6.1–8.1)

## 2017-02-08 ENCOUNTER — Ambulatory Visit (INDEPENDENT_AMBULATORY_CARE_PROVIDER_SITE_OTHER): Payer: Medicare HMO

## 2017-02-08 ENCOUNTER — Other Ambulatory Visit: Payer: Self-pay | Admitting: Family Medicine

## 2017-02-08 DIAGNOSIS — Z23 Encounter for immunization: Secondary | ICD-10-CM

## 2017-02-08 DIAGNOSIS — E7800 Pure hypercholesterolemia, unspecified: Secondary | ICD-10-CM

## 2017-03-01 ENCOUNTER — Telehealth: Payer: Self-pay | Admitting: Family Medicine

## 2017-03-01 NOTE — Telephone Encounter (Signed)
Copied from [place] [phone]. Topic: Quick Communication - See Telephone Encounter >> Mar 01, 2017  8:47 AM Burnis Medin, NT wrote: CRM for notification. See Telephone encounter for:  Pt. Does [employer] online and Pt. Has faxed paperwork over for the doctor to sign so she can get her medication. Medication is pravastatin (PRAVACHOL) 20 MG . 03/01/17.

## 2017-03-05 ENCOUNTER — Other Ambulatory Visit: Payer: Self-pay

## 2017-03-05 DIAGNOSIS — E7800 Pure hypercholesterolemia, unspecified: Secondary | ICD-10-CM

## 2017-04-01 DIAGNOSIS — G8929 Other chronic pain: Secondary | ICD-10-CM | POA: Diagnosis not present

## 2017-04-01 DIAGNOSIS — M79672 Pain in left foot: Secondary | ICD-10-CM | POA: Diagnosis not present

## 2017-04-14 DIAGNOSIS — M25572 Pain in left ankle and joints of left foot: Secondary | ICD-10-CM | POA: Diagnosis not present

## 2017-04-14 DIAGNOSIS — G5762 Lesion of plantar nerve, left lower limb: Secondary | ICD-10-CM | POA: Diagnosis not present

## 2017-05-05 DIAGNOSIS — G5762 Lesion of plantar nerve, left lower limb: Secondary | ICD-10-CM | POA: Diagnosis not present

## 2017-05-18 ENCOUNTER — Ambulatory Visit (INDEPENDENT_AMBULATORY_CARE_PROVIDER_SITE_OTHER): Payer: Medicare HMO

## 2017-05-18 VITALS — BP 126/70 | HR 74 | Temp 97.3°F | Resp 12 | Ht 65.0 in | Wt 137.5 lb

## 2017-05-18 DIAGNOSIS — Z0000 Encounter for general adult medical examination without abnormal findings: Secondary | ICD-10-CM

## 2017-05-18 NOTE — Progress Notes (Signed)
Subjective:   Stacey Munoz is a 72 y.o. female who presents for Medicare Annual (Subsequent) preventive examination.  Review of Systems:  N/A Cardiac Risk Factors include: dyslipidemia;advanced age (>32mn, >>73women)     Objective:     Vitals: BP 126/70 (BP Location: Left Arm, Patient Position: Sitting, Cuff Size: Normal)   Pulse 74   Temp (!) 97.3 F (36.3 C) (Oral)   Resp 12   Ht 5' 5" (1.651 m)   Wt 137 lb 8 oz (62.4 kg)   BMI 22.88 kg/m   Body mass index is 22.88 kg/m.  Advanced Directives 05/18/2017 11/05/2016 05/11/2016 05/11/2016 11/06/2015 05/09/2015 01/22/2015  Does Patient Have a Medical Advance Directive? Yes No Yes No No Yes Yes  Type of [place] - Living will;Healthcare Power of Attorney - - Living will Living will;Healthcare Power of Attorney  Does patient want to make changes to medical advance directive? - - - - - - No - Patient declined  Copy of [place] Chart? No - copy requested - Yes - - Yes No - copy requested  Would patient like information on creating a medical advance directive? - - - - No - patient declined information - No - patient declined information   Tobacco Social History   Tobacco Use  Smoking Status Never Smoker  Smokeless Tobacco Never Used  Tobacco Comment   smoking cessation materials not required     Counseling given: No Comment: smoking cessation materials not required   Clinical Intake:  Pre-visit preparation completed: Yes  Pain : No/denies pain   BMI - recorded: 22.9 Nutritional Status: BMI of 19-24  Normal Nutritional Risks: None Has the patient had any N/V/D within the last 2 months?  No Does the patient have any non-healing wounds?  No Has the patient had any unintentional weight loss or weight gain?  No Diabetic: No  How often do you need to have someone help you when you read instructions, pamphlets, or other written materials from your  doctor or pharmacy?: 1 - Never  Interpreter Needed?: No  Information entered by :: AIdell Pickles LPN  Past Medical History:  Diagnosis Date  . Hyperlipidemia   . Low serum vitamin D    Past Surgical History:  Procedure Laterality Date  . TRIGGER FINGER RELEASE Right May 2016.  . TUBAL LIGATION    . WRIST FRACTURE SURGERY     Family History  Problem Relation Age of Onset  . Breast cancer Mother 652 . Diabetes Father   . Cancer Father   . Heart disease Brother   . Breast cancer Paternal Grandmother   . Kidney disease Maternal Grandmother   . Cancer Maternal Grandfather    Social History   Socioeconomic History  . Marital status: Divorced    Spouse name: None  . Number of children: 3  . Years of education: None  . Highest education level: Doctorate  Social Needs  . Financial resource strain: Not hard at all  . Food insecurity - worry: Never true  . Food insecurity - inability: Never true  . Transportation needs - medical: No  . Transportation needs - non-medical: No  Occupational History    Employer: RETIRED  Tobacco Use  . Smoking status: Never Smoker  . Smokeless tobacco: Never Used  . Tobacco comment: smoking cessation materials not required  Substance and Sexual Activity  . Alcohol use: Yes    Alcohol/week: 0.6 oz  Types: 1 Glasses of wine per week    Comment: rare  . Drug use: No  . Sexual activity: None  Other Topics Concern  . None  Social History Narrative  . None    Outpatient Encounter Medications as of 05/18/2017  Medication Sig  . calcium-vitamin D (SM CALCIUM 500/VITAMIN D3) 500-400 MG-UNIT per tablet Take by mouth.  . Coenzyme Q10 100 MG capsule Take by mouth.  Randell Loop Prim-Borage (FLAX OIL XTRA) CAPS Take by mouth.  . Multiple Vitamin (MULTI-VITAMINS) TABS Take by mouth.  . pravastatin (PRAVACHOL) 20 MG tablet Take 1 tablet (20 mg total) by mouth daily. (Patient not taking: Reported on 05/18/2017)   No facility-administered encounter  medications on file as of 05/18/2017.     Activities of Daily Living In your present state of health, do you have any difficulty performing the following activities: 05/18/2017 11/05/2016  Hearing? N Y  Comment denies hearing aids -  Vision? N Y  Comment wears eyeglasses glasses  Difficulty concentrating or making decisions? N N  Comment DECLINED 6CIT TODAY -  Walking or climbing stairs? N N  Dressing or bathing? N N  Doing errands, shopping? N N  Preparing Food and eating ? N -  Comment denies dentures -  Using the Toilet? N -  In the past six months, have you accidently leaked urine? N -  Do you have problems with loss of bowel control? N -  Managing your Medications? N -  Managing your Finances? N -  Housekeeping or managing your Housekeeping? N -  Some recent data might be hidden    Patient Care Team: Geri Seminole, MD as PCP - General (Internal Medicine) Birder Robson, MD as Referring Physician (Ophthalmology)    Assessment:   This is a routine wellness examination for [name].  Exercise Activities and Dietary recommendations Current Exercise Habits: Home exercise routine, Type of exercise: yoga;walking;stretching;strength training/weights;Other - see comments(dancing), Time (Minutes): > 60, Frequency (Times/Week): 6, Weekly Exercise (Minutes/Week): 0, Intensity: Mild, Exercise limited by: None identified  Goals    . DIET - INCREASE WATER INTAKE     Recommend to drink at least 6-8 8oz glasses of water per day.     . Weight (lb) < 200 lb (90.7 kg)     Starting 05/11/16, I will work to loss 10 lbs.       Fall Risk Fall Risk  05/18/2017 11/05/2016 05/11/2016 05/11/2016 11/06/2015  Falls in the past year? _0    Is the patient's home free of loose throw rugs in walkways, pet beds, electrical cords, etc? Yes Does the patient have any grab bars in the bathroom? No  Does the patient use a shower chair when bathing? Yes Does the patient have any stairs in or  around the home? No If so, are there any handrails?  N/A Does the patient have adequate lighting?  Yes Does the patient use a cane, walker or w/c? No Does the patient use of an elevated toilet seat? No  Timed Get Up and Go Performed: Yes. Pt ambulated 10 feet within 7 sec. Gait stead-fast and without the use of an assistive device. No intervention required at this time. Fall risk prevention has been discussed.  Depression Screen PHQ 2/9 Scores 05/18/2017 11/05/2016 05/11/2016 05/11/2016  PHQ - 2 Score 0 0 1 0     Cognitive Function: Declined     6CIT Screen 05/11/2016  What Year? 0 points  What month? 0 points  What time? 0 points  Count back from 20 0 points  Months in reverse 0 points  Repeat phrase 0 points  Total Score 0    Immunization History  Administered Date(s) Administered  . Encephalitis 05/14/2009, 05/20/2009, 06/10/2009  . Hepatitis A 05/10/2009, 10/23/2009  . Hepatitis A, Adult 11/05/2016  . Hepatitis B 05/10/2009, 06/17/2009, 10/23/2009  . IPV 09/09/2007  . Influenza, High Dose Seasonal PF 04/03/2016, 02/08/2017  . Influenza-Unspecified 06/03/2009, 07/22/2010, 01/25/2014  . MMR 09/09/2007  . Pneumococcal Conjugate-13 05/11/2016  . Pneumococcal Polysaccharide-23 01/16/2013  . Rabies, IM 05/08/2009, 05/14/2009, 05/29/2009  . Td 06/03/2011  . Tdap 06/02/2009  . Typhoid Parenteral, AKD ([place] [employer]) 05/20/2009    Qualifies for Shingles Vaccine? Yes. Due for Zostavax or Shingrix vaccine. Education has been provided regarding the importance of this vaccine. Pt has been advised to call her insurance company to determine her out of pocket expense. Advised she may also receive this vaccine at her local pharmacy or Health Dept. Verbalized acceptance and understanding.  Screening Tests Health Maintenance  Topic Date Due  . MAMMOGRAM  05/18/2018 (Originally 05/18/2017)  . TETANUS/TDAP  06/02/2021  . COLONOSCOPY  01/27/2024  . INFLUENZA VACCINE  Completed  . DEXA SCAN   Completed  . Hepatitis C Screening  Completed  . PNA vac Low Risk Adult  Completed    Cancer Screenings: Lung: Low Dose CT Chest recommended if Age 64-80 years, 30 pack-year currently smoking OR have quit w/in 15years. Patient does not qualify. NON-SMOKER Breast:  Up to date on Mammogram? Yes. Completed 05/18/16. Repeat every year. Declined my offer to order exam today. States she prefers to repeat this exam every 2 years. Requesting to repeat mammogram on or after 05/18/18 Up to date of Bone Density/Dexa? Yes. Completed 02/15/13. Osteoporotic screenings no longer required. Colorectal: Colonoscopy completed 01/26/14. Repeat every 10 years.  Additional Screenings: Hepatitis B/HIV/Syphillis: Does not qualify Hepatitis C Screening: Completed 05/11/16    Plan:  In preparation for this patient's Medicare Annual Wellness visit, I have personally reviewed the following information in the patient's chart:  A. Medical and Surgical History B. Office visits, Hospitalizations and ER visits within the last 12 months C. Radiology, Laboratory and Pathological Reports (including those records in [facility]) D. Health Maintenance for accuracy and any attached, scanned or relevant reports or letters E.  Immunization History F.  Upcoming referrals and appointments G. Advance directives and Code Status  I have personally addressed the Medicare Wellness Questionnaire with the patient and have noted and/or up dated the following information:  A.  Current medications (including OTC medications) B. Medication Allergies C. Medical and Surgical History D.  Physicians on the patient's care team [place] Physical activity G. Functional ability and status H. Nutritional status I. Risk for fall and preventative measures (including TUG test) J. Use of alcohol, tobacco or illicit drugs  K.          Screenings such as hearing, vision, cognitive and depression L. Advance Directives and Code  Status M. Realistic Patient Goals N. Financial and Social strains, if any  In addition, I have reviewed and discussed with the patient, certain preventive protocols, quality metrics, and best practice recommendations. To ensure quality care and proper  continuity of care, any concerns that arose during the Medicare Wellness visit were addressed with the patient's physician. A written personalized care plan for preventive services as well as general preventive health recommendations were provided to patient.  Signed,  Aleatha Borer, LPN  I, as supervising physician, have reviewed the nurse health advisor's Medicare Wellness Visit note for this patient and concur with the findings and recommendations listed above.  Signed Syed Asad A. Manuella Ghazi MD Attending Physician.

## 2017-05-18 NOTE — Patient Instructions (Signed)
Stacey Munoz , Thank you for taking time to come for your Medicare Wellness Visit. I appreciate your ongoing commitment to your health goals. Please review the following plan we discussed and let me know if I can assist you in the future.   Screening recommendations/referrals: Colorectal Screening: Colonoscopy completed 01/26/14. Repeat every 10 years. Mammogram: Completed 05/18/16. Repeat every year. You have declined to repeat this exam every year. You have elected to repeat this exam every 2 years RATHER than every year. Bone Density: Completed 02/15/13. Osteoporotic screenings no longer required. Lung Cancer Screening: You do not qualify for this screening Hepatitis C Screening:Completed 05/11/16 HIV/Syphilis/Hepatitis B Screening: You do not qualify for this screening   Vision/Dental Exams: Recommended yearly ophthalmology/optometry visit for glaucoma screening and checkup Recommended yearly dental visit for hygiene and checkup  Vaccinations: Influenza vaccine: Completed 02/08/17 Pneumococcal vaccine: Completed PCV13 05/11/16 and PPSV23 01/16/13 Tdap vaccine: Completed 06/03/11 Shingles vaccine: Declined. Please call your insurance company to determine your out of pocket expense. You may also receive this vaccine at your local pharmacy or Health Dept.  Advanced directives: Please bring a copy of your POA (Power of Attorney) and/or Living Will to your next appointment.   Conditions/risks identified: Recommend to drink at least 6-8 8oz glasses of water per day.  Next appointment: You are scheduled to see Dr. Manuella Ghazi on 05/31/17 @ 9:00am.   Please schedule your Annual Wellness Visit with your Nurse Health Advisor in one year.  Preventive Care 10 Years and Older, Female Preventive care refers to lifestyle choices and visits with your health care provider that can promote health and wellness. What does preventive care include?  A yearly physical exam. This is also called an annual well  check.  Dental exams once or twice a year.  Routine eye exams. Ask your health care provider how often you should have your eyes checked.  Personal lifestyle choices, including:  Daily care of your teeth and gums.  Regular physical activity.  Eating a healthy diet.  Avoiding tobacco and drug use.  Limiting alcohol use.  Practicing safe sex.  Taking low-dose aspirin every day.  Taking vitamin and mineral supplements as recommended by your health care provider. What happens during an annual well check? The services and screenings done by your health care provider during your annual well check will depend on your age, overall health, lifestyle risk factors, and family history of disease. Counseling  Your health care provider may ask you questions about your:  Alcohol use.  Tobacco use.  Drug use.  Emotional well-being.  Home and relationship well-being.  Sexual activity.  Eating habits.  History of falls.  Memory and ability to understand (cognition).  Work and work Statistician.  Reproductive health. Screening  You may have the following tests or measurements:  Height, weight, and BMI.  Blood pressure.  Lipid and cholesterol levels. These may be checked every 5 years, or more frequently if you are over 29 years old.  Skin check.  Lung cancer screening. You may have this screening every year starting at age 101 if you have a 30-pack-year history of smoking and currently smoke or have quit within the past 15 years.  Fecal occult blood test (FOBT) of the stool. You may have this test every year starting at age 48.  Flexible sigmoidoscopy or colonoscopy. You may have a sigmoidoscopy every 5 years or a colonoscopy every 10 years starting at age 81.  Hepatitis C blood test.  Hepatitis B blood test.  Sexually  transmitted disease (STD) testing.  Diabetes screening. This is done by checking your blood sugar (glucose) after you have not eaten for a while  (fasting). You may have this done every 1-3 years.  Bone density scan. This is done to screen for osteoporosis. You may have this done starting at age 26.  Mammogram. This may be done every 1-2 years. Talk to your health care provider about how often you should have regular mammograms. Talk with your health care provider about your test results, treatment options, and if necessary, the need for more tests. Vaccines  Your health care provider may recommend certain vaccines, such as:  Influenza vaccine. This is recommended every year.  Tetanus, diphtheria, and acellular pertussis (Tdap, Td) vaccine. You may need a Td booster every 10 years.  Zoster vaccine. You may need this after age 85.  Pneumococcal 13-valent conjugate (PCV13) vaccine. One dose is recommended after age 33.  Pneumococcal polysaccharide (PPSV23) vaccine. One dose is recommended after age 69. Talk to your health care provider about which screenings and vaccines you need and how often you need them. This information is not intended to replace advice given to you by your health care provider. Make sure you discuss any questions you have with your health care provider. Document Released: 05/10/2015 Document Revised: 01/01/2016 Document Reviewed: 02/12/2015 Elsevier Interactive Patient Education  2017 [place] Prevention in the Home Falls can cause injuries. They can happen to people of all ages. There are many things you can do to make your home safe and to help prevent falls. What can I do on the outside of my home?  Regularly fix the edges of walkways and driveways and fix any cracks.  Remove anything that might make you trip as you walk through a door, such as a raised step or threshold.  Trim any bushes or trees on the path to your home.  Use bright outdoor lighting.  Clear any walking paths of anything that might make someone trip, such as rocks or tools.  Regularly check to see if handrails are loose  or broken. Make sure that both sides of any steps have handrails.  Any raised decks and porches should have guardrails on the edges.  Have any leaves, snow, or ice cleared regularly.  Use sand or salt on walking paths during winter.  Clean up any spills in your garage right away. This includes oil or grease spills. What can I do in the bathroom?  Use night lights.  Install grab bars by the toilet and in the tub and shower. Do not use towel bars as grab bars.  Use non-skid mats or decals in the tub or shower.  If you need to sit down in the shower, use a plastic, non-slip stool.  Keep the floor dry. Clean up any water that spills on the floor as soon as it happens.  Remove soap buildup in the tub or shower regularly.  Attach bath mats securely with double-sided non-slip rug tape.  Do not have throw rugs and other things on the floor that can make you trip. What can I do in the bedroom?  Use night lights.  Make sure that you have a light by your bed that is easy to reach.  Do not use any sheets or blankets that are too big for your bed. They should not hang down onto the floor.  Have a firm chair that has side arms. You can use this for support while you get dressed.  Do not have throw rugs and other things on the floor that can make you trip. What can I do in the kitchen?  Clean up any spills right away.  Avoid walking on wet floors.  Keep items that you use a lot in easy-to-reach places.  If you need to reach something above you, use a strong step stool that has a grab bar.  Keep electrical cords out of the way.  Do not use floor polish or wax that makes floors slippery. If you must use wax, use non-skid floor wax.  Do not have throw rugs and other things on the floor that can make you trip. What can I do with my stairs?  Do not leave any items on the stairs.  Make sure that there are handrails on both sides of the stairs and use them. Fix handrails that are  broken or loose. Make sure that handrails are as long as the stairways.  Check any carpeting to make sure that it is firmly attached to the stairs. Fix any carpet that is loose or worn.  Avoid having throw rugs at the top or bottom of the stairs. If you do have throw rugs, attach them to the floor with carpet tape.  Make sure that you have a light switch at the top of the stairs and the bottom of the stairs. If you do not have them, ask someone to add them for you. What else can I do to help prevent falls?  Wear shoes that:  Do not have high heels.  Have rubber bottoms.  Are comfortable and fit you well.  Are closed at the toe. Do not wear sandals.  If you use a stepladder:  Make sure that it is fully opened. Do not climb a closed stepladder.  Make sure that both sides of the stepladder are locked into place.  Ask someone to hold it for you, if possible.  Clearly mark and make sure that you can see:  Any grab bars or handrails.  First and last steps.  Where the edge of each step is.  Use tools that help you move around (mobility aids) if they are needed. These include:  Canes.  Walkers.  Scooters.  Crutches.  Turn on the lights when you go into a dark area. Replace any light bulbs as soon as they burn out.  Set up your furniture so you have a clear path. Avoid moving your furniture around.  If any of your floors are uneven, fix them.  If there are any pets around you, be aware of where they are.  Review your medicines with your doctor. Some medicines can make you feel dizzy. This can increase your chance of falling. Ask your doctor what other things that you can do to help prevent falls. This information is not intended to replace advice given to you by your health care provider. Make sure you discuss any questions you have with your health care provider. Document Released: 02/07/2009 Document Revised: 09/19/2015 Document Reviewed: 05/18/2014 Elsevier  Interactive Patient Education  2017 [employer].

## 2017-05-31 ENCOUNTER — Encounter: Payer: Self-pay | Admitting: Family Medicine

## 2017-05-31 ENCOUNTER — Ambulatory Visit (INDEPENDENT_AMBULATORY_CARE_PROVIDER_SITE_OTHER): Payer: Medicare HMO | Admitting: Family Medicine

## 2017-05-31 VITALS — BP 116/68 | HR 68 | Temp 97.7°F | Resp 14 | Ht 65.0 in | Wt 136.8 lb

## 2017-05-31 DIAGNOSIS — Z0000 Encounter for general adult medical examination without abnormal findings: Secondary | ICD-10-CM

## 2017-05-31 DIAGNOSIS — Z0001 Encounter for general adult medical examination with abnormal findings: Secondary | ICD-10-CM | POA: Diagnosis not present

## 2017-05-31 DIAGNOSIS — H9202 Otalgia, left ear: Secondary | ICD-10-CM

## 2017-05-31 DIAGNOSIS — E559 Vitamin D deficiency, unspecified: Secondary | ICD-10-CM

## 2017-05-31 DIAGNOSIS — M810 Age-related osteoporosis without current pathological fracture: Secondary | ICD-10-CM | POA: Diagnosis not present

## 2017-05-31 DIAGNOSIS — Z23 Encounter for immunization: Secondary | ICD-10-CM

## 2017-05-31 DIAGNOSIS — E7800 Pure hypercholesterolemia, unspecified: Secondary | ICD-10-CM | POA: Diagnosis not present

## 2017-05-31 NOTE — Progress Notes (Signed)
Name: Stacey Munoz   MRN: [id number]    DOB: 12-18-1945   Date:05/31/2017       Progress Note  Subjective  Chief Complaint  Chief Complaint  Patient presents with  . Annual Exam    Bone denisty scan order; due to hip pain   . Immunizations    Hep A 2nd round; first rounf july 2018    HPI  Pt. presents for Complete Physical Exam.  She has completed her Annual Wellness Visit. She is due for DEXA Scan for follow-up of Osteoporosis in Femur.   Past Medical History:  Diagnosis Date  . Hyperlipidemia   . Low serum vitamin D     Past Surgical History:  Procedure Laterality Date  . TRIGGER FINGER RELEASE Right May 2016.  . TUBAL LIGATION    . WRIST FRACTURE SURGERY      Family History  Problem Relation Age of Onset  . Breast cancer Mother 19  . Diabetes Father   . Cancer Father   . Heart disease Brother   . Clotting disorder Brother   . Breast cancer Paternal Grandmother        PT think it was cancer; never got check   . Kidney disease Maternal Grandmother   . Cancer Maternal Grandfather   . Gout Son   . Heart disease Son     Social History   Socioeconomic History  . Marital status: Divorced    Spouse name: Not on file  . Number of children: 3  . Years of education: Not on file  . Highest education level: Doctorate  Social Needs  . Financial resource strain: Not hard at all  . Food insecurity - worry: Never true  . Food insecurity - inability: Never true  . Transportation needs - medical: No  . Transportation needs - non-medical: No  Occupational History    Employer: RETIRED  Tobacco Use  . Smoking status: Never Smoker  . Smokeless tobacco: Never Used  . Tobacco comment: smoking cessation materials not required  Substance and Sexual Activity  . Alcohol use: Yes    Alcohol/week: 0.6 oz    Types: 1 Glasses of wine per week    Comment: rare  . Drug use: No  . Sexual activity: No  Other Topics Concern  . Not on file  Social History Narrative  .  Not on file     Current Outpatient Medications:  .  calcium-vitamin D (SM CALCIUM 500/VITAMIN D3) 500-400 MG-UNIT per tablet, Take by mouth., Disp: , Rfl:  .  Coenzyme Q10 100 MG capsule, Take by mouth., Disp: , Rfl:  .  Flaxseed-Eve Prim-Borage (FLAX OIL XTRA) CAPS, Take by mouth., Disp: , Rfl:  .  Multiple Vitamin (MULTI-VITAMINS) TABS, Take by mouth., Disp: , Rfl:  .  pravastatin (PRAVACHOL) 20 MG tablet, Take 1 tablet (20 mg total) by mouth daily. (Patient not taking: Reported on 05/18/2017), Disp: 90 tablet, Rfl: 0  No Known Allergies   Review of Systems  Constitutional: Negative for chills, fever, malaise/fatigue and weight loss.  HENT: Positive for ear pain (left ear itching, both ears have increased wax, no pain or drainage from either ear.). Negative for congestion, ear discharge, sinus pain (left sinus pain. ) and sore throat.   Eyes: Negative for blurred vision (occasional floaters) and double vision.  Respiratory: Negative for cough, sputum production and shortness of breath.   Cardiovascular: Negative for chest pain, palpitations and leg swelling.  Gastrointestinal: Negative for abdominal pain, blood in  stool, constipation, diarrhea, nausea and vomiting.  Genitourinary: Negative for dysuria, hematuria and urgency.  Musculoskeletal: Negative for back pain and neck pain (upper back and neck stiffness, usually from prolonged sitting).  Neurological: Negative for dizziness and headaches.  Psychiatric/Behavioral: Negative for depression. The patient is nervous/anxious. The patient does not have insomnia.      Objective  Vitals:   05/31/17 0913  BP: 116/68  Pulse: 68  Resp: 14  Temp: 97.7 F (36.5 C)  TempSrc: Oral  SpO2: 96%  Weight: 136 lb 12.8 oz (62.1 kg)  Height: 5\' 5"  (1.651 m)    Physical Exam  Constitutional: She is oriented to person, place, and time and well-developed, well-nourished, and in no distress.  HENT:  Head: Normocephalic and atraumatic.  Right  Ear: External ear normal.  Left Ear: External ear normal.  Mouth/Throat: Oropharynx is clear and moist.  Eyes: Conjunctivae are normal. Pupils are equal, round, and reactive to light.  Neck: Neck supple.  Cardiovascular: Normal rate, regular rhythm and normal heart sounds.  No murmur heard. Pulmonary/Chest: Effort normal and breath sounds normal. She has no wheezes.  Abdominal: Soft. Bowel sounds are normal. There is no tenderness.  Musculoskeletal: She exhibits no edema.  Neurological: She is alert and oriented to person, place, and time.  Psychiatric: Mood, memory, affect and judgment normal.  Nursing note and vitals reviewed.     Assessment & Plan  1. Well woman exam (no gynecological exam) Obtain age-appropriate laboratory screening - Lipid panel - COMPLETE METABOLIC PANEL WITH GFR  2. Osteoporosis of femur without pathological fracture She was apparently diagnosed with osteoporosis in 2014, never started on treatment, repeat DEXA scan today and consider starting on Fosamax - DG Bone Density; Future  3. Vitamin D insufficiency  - VITAMIN D 25 Hydroxy (Vit-D Deficiency, Fractures)  4. Need for hepatitis A immunization  - Hepatitis A vaccine adult IM  5. Left ear pain Left ear normal and clinical exam, referral to ENT - Ambulatory referral to ENT   Valen Gillison [facility] 05/31/2017 9:29 AM

## 2017-06-01 LAB — COMPLETE METABOLIC PANEL WITH GFR
AG RATIO: 1.7 (calc) (ref 1.0–2.5)
ALT: 16 U/L (ref 6–29)
AST: 19 U/L (ref 10–35)
Albumin: 4.3 g/dL (ref 3.6–5.1)
Alkaline phosphatase (APISO): 75 U/L (ref 33–130)
BILIRUBIN TOTAL: 0.9 mg/dL (ref 0.2–1.2)
BUN: 13 mg/dL (ref 7–25)
CO2: 27 mmol/L (ref 20–32)
Calcium: 10 mg/dL (ref 8.6–10.4)
Chloride: 105 mmol/L (ref 98–110)
Creat: 0.78 mg/dL (ref 0.60–0.93)
GFR, Est African American: 88 mL/min/{1.73_m2} (ref >=60)
GFR, Est Non African American: 76 mL/min/{1.73_m2} (ref >=60)
Globulin: 2.5 g/dL (calc) (ref 1.9–3.7)
Glucose, Bld: 105 mg/dL — ABNORMAL HIGH (ref 65–99)
POTASSIUM: 4.1 mmol/L (ref 3.5–5.3)
SODIUM: 140 mmol/L (ref 135–146)
Total Protein: 6.8 g/dL (ref 6.1–8.1)

## 2017-06-01 LAB — LIPID PANEL
CHOL/HDL RATIO: 4.1 (calc) (ref <5.0)
Cholesterol: 280 mg/dL — ABNORMAL HIGH (ref <200)
HDL: 68 mg/dL (ref >50)
LDL CHOLESTEROL (CALC): 182 mg/dL — AB
NON-HDL CHOLESTEROL (CALC): 212 mg/dL — AB (ref <130)
TRIGLYCERIDES: 158 mg/dL — AB (ref <150)

## 2017-06-01 LAB — VITAMIN D 25 HYDROXY (VIT D DEFICIENCY, FRACTURES): Vit D, 25-Hydroxy: 31 ng/mL (ref 30–100)

## 2017-06-07 ENCOUNTER — Other Ambulatory Visit: Payer: Self-pay | Admitting: Unknown Physician Specialty

## 2017-06-07 DIAGNOSIS — H9202 Otalgia, left ear: Secondary | ICD-10-CM

## 2017-06-07 DIAGNOSIS — R519 Headache, unspecified: Secondary | ICD-10-CM

## 2017-06-07 DIAGNOSIS — H903 Sensorineural hearing loss, bilateral: Secondary | ICD-10-CM | POA: Diagnosis not present

## 2017-06-07 DIAGNOSIS — R51 Headache: Secondary | ICD-10-CM

## 2017-06-15 ENCOUNTER — Ambulatory Visit
Admission: RE | Admit: 2017-06-15 | Discharge: 2017-06-15 | Disposition: A | Discharge status: Home / Self Care | Payer: Medicare HMO | Source: Ambulatory Visit | Attending: Unknown Physician Specialty | Admitting: Unknown Physician Specialty

## 2017-06-15 ENCOUNTER — Other Ambulatory Visit: Payer: Self-pay | Admitting: Unknown Physician Specialty

## 2017-06-15 DIAGNOSIS — H9202 Otalgia, left ear: Secondary | ICD-10-CM | POA: Diagnosis not present

## 2017-06-15 DIAGNOSIS — E041 Nontoxic single thyroid nodule: Secondary | ICD-10-CM | POA: Insufficient documentation

## 2017-06-15 DIAGNOSIS — R51 Headache: Secondary | ICD-10-CM | POA: Diagnosis not present

## 2017-06-15 DIAGNOSIS — H903 Sensorineural hearing loss, bilateral: Secondary | ICD-10-CM | POA: Diagnosis not present

## 2017-06-15 DIAGNOSIS — R519 Headache, unspecified: Secondary | ICD-10-CM

## 2017-06-23 ENCOUNTER — Ambulatory Visit: Payer: Medicare HMO

## 2017-06-24 ENCOUNTER — Other Ambulatory Visit: Payer: Self-pay | Admitting: Unknown Physician Specialty

## 2017-06-24 DIAGNOSIS — E041 Nontoxic single thyroid nodule: Secondary | ICD-10-CM

## 2017-06-29 ENCOUNTER — Ambulatory Visit: Admission: RE | Admit: 2017-06-29 | Payer: Medicare HMO | Source: Ambulatory Visit

## 2017-06-29 ENCOUNTER — Ambulatory Visit: Payer: Medicare HMO

## 2017-07-01 ENCOUNTER — Ambulatory Visit: Payer: Medicare HMO

## 2017-07-06 ENCOUNTER — Ambulatory Visit
Admission: RE | Admit: 2017-07-06 | Discharge: 2017-07-06 | Disposition: A | Discharge status: Home / Self Care | Payer: Medicare HMO | Source: Ambulatory Visit | Attending: Unknown Physician Specialty | Admitting: Unknown Physician Specialty

## 2017-07-06 ENCOUNTER — Other Ambulatory Visit (HOSPITAL_COMMUNITY): Payer: Self-pay | Admitting: Interventional Radiology

## 2017-07-06 ENCOUNTER — Other Ambulatory Visit: Payer: Self-pay | Admitting: Unknown Physician Specialty

## 2017-07-06 DIAGNOSIS — E041 Nontoxic single thyroid nodule: Secondary | ICD-10-CM | POA: Diagnosis not present

## 2017-07-06 NOTE — Discharge Instructions (Signed)
Thyroid Biopsy, Care After °Refer to this sheet in the next few weeks. These instructions provide you with information on caring for yourself after your procedure. Your health care provider may also give you more specific instructions. Your treatment has been planned according to current medical practices, but problems sometimes occur. Call your health care provider if you have any problems or questions after your procedure. °What can I expect after the procedure? °After your procedure, it is typical to have the following: °· You may have soreness and tenderness at the biopsy site for a few days. °· You may have a sore throat or a hoarse voice if you had an open biopsy. This should go away after a couple days. ° °Follow these instructions at home: °· Take medicines only as directed by your health care provider. °· To ease discomfort at the biopsy site: °? Keep your head raised on a pillow when you are lying down. °? Support the back of your head and neck with both hands as you sit up from a lying position. °· If you have a sore throat, try using throat lozenges or gargling with warm salt water. °· Keep all follow-up visits as directed by your health care provider. This is important. °Contact a health care provider if: °· You have a fever. °Get help right away if: °· You have severe bleeding from the biopsy site. °· You have difficulty swallowing. °· You have drainage, redness, swelling, or pain at the biopsy site. °· You have swollen glands (lymph nodes) in your neck. °This information is not intended to replace advice given to you by your health care provider. Make sure you discuss any questions you have with your health care provider. °Document Released: 11/08/2013 Document Revised: 12/15/2015 Document Reviewed: 07/06/2013 °Elsevier Interactive Patient Education © 2018 Elsevier Inc. ° °

## 2017-07-08 LAB — CYTOLOGY - NON PAP

## 2017-07-10 DIAGNOSIS — E041 Nontoxic single thyroid nodule: Secondary | ICD-10-CM | POA: Diagnosis not present

## 2017-07-19 LAB — CYTOLOGY - NON PAP

## 2017-07-21 ENCOUNTER — Encounter: Payer: Self-pay | Admitting: Interventional Radiology

## 2017-08-18 ENCOUNTER — Telehealth: Payer: Self-pay | Admitting: Family Medicine

## 2017-08-18 NOTE — Telephone Encounter (Signed)
Copied from [place] [id number]. Topic: General - Other >> Aug 18, 2017  4:07 PM Boyd Kerbs wrote: Reason for CRM: pt. Had been waiting for someone to call and set up appt. For her bone density test.  She was given the name of [facility]. Told her to send report to Dr. Ancil Boozer  >> Aug 18, 2017  4:27 PM Bea Graff, NT wrote: Pt states that she has been around in circles today to try and get the bone density scan done. She needs a new order sent in from Dr. Ancil Boozer since Dr. Manuella Ghazi is no longer at the practice per the [facility]. Please advise.

## 2017-08-24 ENCOUNTER — Encounter: Payer: Self-pay | Admitting: Family Medicine

## 2017-08-24 ENCOUNTER — Ambulatory Visit
Admission: RE | Admit: 2017-08-24 | Discharge: 2017-08-24 | Disposition: A | Discharge status: Home / Self Care | Payer: Medicare HMO | Source: Ambulatory Visit | Attending: Family Medicine | Admitting: Family Medicine

## 2017-08-24 DIAGNOSIS — M810 Age-related osteoporosis without current pathological fracture: Secondary | ICD-10-CM | POA: Insufficient documentation

## 2017-08-25 NOTE — Progress Notes (Signed)
LVM for pt to call office and schedule an appt

## 2017-08-30 ENCOUNTER — Telehealth: Payer: Self-pay

## 2017-08-30 ENCOUNTER — Telehealth: Payer: Self-pay | Admitting: Family Medicine

## 2017-08-30 NOTE — Telephone Encounter (Signed)
Left a message for patient to call us back regarding her Bone Density and she will need to schedule an appt.

## 2017-08-30 NOTE — Telephone Encounter (Signed)
Pt called to get the result of her bone density study. No request found to give the patient her results.

## 2017-08-30 NOTE — Telephone Encounter (Signed)
Copied from [place]. Topic: Inquiry >> Aug 30, 2017  1:46 PM Pricilla Handler wrote: Reason for CRM: Patient called requesting her Bone Density Results.       Thank You!!!

## 2017-09-08 ENCOUNTER — Encounter: Payer: Self-pay | Admitting: Nurse Practitioner

## 2017-09-08 ENCOUNTER — Ambulatory Visit: Payer: Medicare HMO | Admitting: Nurse Practitioner

## 2017-09-08 VITALS — BP 112/68 | HR 98 | Temp 97.8°F | Resp 16 | Ht 65.0 in | Wt 137.8 lb

## 2017-09-08 DIAGNOSIS — E7800 Pure hypercholesterolemia, unspecified: Secondary | ICD-10-CM

## 2017-09-08 DIAGNOSIS — F418 Other specified anxiety disorders: Secondary | ICD-10-CM | POA: Diagnosis not present

## 2017-09-08 DIAGNOSIS — M810 Age-related osteoporosis without current pathological fracture: Secondary | ICD-10-CM

## 2017-09-08 NOTE — Progress Notes (Addendum)
Name: Stacey Munoz   MRN: [id number]    DOB: February 21, 1946   Date:09/08/2017       Progress Note  Subjective  Chief Complaint  Chief Complaint  Patient presents with  . Follow-up    bone desnsity results    HPI    Patient presents for review of bone density results:  The BMD measured at Femur Neck Left is 0.575 g/cm2 with a T-score of -3.3. This patient is considered osteoporotic according to [facility]) criteria. L-spine was excluded due to degenerative changes. Right Forearm Radius 33% 08/24/2017 [age] Osteoporosis -3.0 0.615 G/cm2    Osteoporosis Patient has been taking calcium-vitamind D supplements  positive history of fractures- sacrum as a child and wrist within the last 10 years.   Does yoga, walks 3 miles a day, biking, horseback riding lessons.  Denies Esophageal disorders, chronic kidney disease tried bisphosphonate for a few months in the past, then read up on them and was concerned about side effects and stopped taking it.   Hyperlipidemia Patient sts stopped taking pravastatin- never wanted to be on it and then had issues with refill between pharmacy and clinic. Would not like it refilled and wouldn't take it again per patient- made her feel off. Has cut out ice cream from her diet. Maternal history of hyperlipidemia   Patient Active Problem List   Diagnosis Date Noted  . Osteoporosis 08/24/2017  . DNR (do not resuscitate) discussion 05/23/2015  . Diminished hearing 01/22/2015  . Low hemoglobin 10/15/2014  . Vitamin D deficiency 10/15/2014  . Hyperlipidemia 10/15/2014  . Retained orthopedic hardware 04/14/2013  . Triggering of digit 02/03/2013    Past Medical History:  Diagnosis Date  . Hyperlipidemia   . Low serum vitamin D     Past Surgical History:  Procedure Laterality Date  . TRIGGER FINGER RELEASE Right May 2016.  . TUBAL LIGATION    . WRIST FRACTURE SURGERY      Social History   Tobacco Use  . Smoking status:  Never Smoker  . Smokeless tobacco: Never Used  . Tobacco comment: smoking cessation materials not required  Substance Use Topics  . Alcohol use: Yes    Alcohol/week: 0.6 oz    Types: 1 Glasses of wine per week    Comment: rare     Current Outpatient Medications:  .  calcium-vitamin D (SM CALCIUM 500/VITAMIN D3) 500-400 MG-UNIT per tablet, Take by mouth., Disp: , Rfl:  .  Coenzyme Q10 100 MG capsule, Take by mouth., Disp: , Rfl:  .  Flaxseed-Eve Prim-Borage (FLAX OIL XTRA) CAPS, Take by mouth., Disp: , Rfl:  .  Multiple Vitamin (MULTI-VITAMINS) TABS, Take by mouth., Disp: , Rfl:  .  pravastatin (PRAVACHOL) 20 MG tablet, Take 1 tablet (20 mg total) by mouth daily. (Patient not taking: Reported on 05/18/2017), Disp: 90 tablet, Rfl: 0  No Known Allergies  ROS .  No other specific complaints in a complete review of systems (except as listed in HPI above).  Objective  Vitals:   09/08/17 0951  BP: 112/68  Pulse: 98  Resp: 16  Temp: 97.8 F (36.6 C)  TempSrc: Oral  SpO2: 97%  Weight: 137 lb 12.8 oz (62.5 kg)  Height: 5\' 5"  (1.651 m)     Body mass index is 22.93 kg/m.  Nursing Note and Vital Signs reviewed.  Physical Exam   Constitutional: Patient appears well-developed and well-nourished. No distress.  Cardiovascular: Normal rate,  Pulmonary/Chest: Effort normal  Psychiatric: Patient has  a normal mood and affect. behavior is normal. Judgment and thought content normal.  No results found for this or any previous visit (from the past 72 hour(s)).  Assessment & Plan  1. Age-related osteoporosis without current pathological fracture Pt has + lifestyle- active, taking VitD/calcium supplementation, doesn't smoke. Discussed high-risk activity of injury with horseback riding- risks/benfits explored. Pt sts would not like recommended referral for comprehensive discussion of alternate therapies to bisphosphates with endo or rheum. Discussed in detail DEXA scan results and  implications- made aware of risks/benefits of treatment vs non-treatment at this time pt ardently refusing treatment and referral. Will let us know if she changes her mind     2. Pure hypercholesterolemia -discussed diet with  Patient, does not want/plan on taking medication. Will adjust diet and recheck next visit.  3. Anxiety about health Face-to-face time with patient was more than 25 minutes, >50% time spent counseling or/and coordination of care  ------------------------------------------- I have reviewed this encounter including the documentation in this note and/or discussed this patient with the provider, Suezanne Cheshire DNP AGNP-C. I am certifying that I agree with the content of this note as supervising physician. Enid Derry, [facility] 09/22/2017, 3:56 PM

## 2017-09-08 NOTE — Patient Instructions (Signed)

## 2017-09-21 ENCOUNTER — Other Ambulatory Visit: Payer: Self-pay

## 2017-09-27 DIAGNOSIS — M79661 Pain in right lower leg: Secondary | ICD-10-CM | POA: Diagnosis not present

## 2017-09-29 DIAGNOSIS — M79661 Pain in right lower leg: Secondary | ICD-10-CM | POA: Diagnosis not present

## 2017-12-07 ENCOUNTER — Ambulatory Visit (INDEPENDENT_AMBULATORY_CARE_PROVIDER_SITE_OTHER): Payer: Medicare HMO | Admitting: Family Medicine

## 2017-12-07 ENCOUNTER — Encounter: Payer: Self-pay | Admitting: Family Medicine

## 2017-12-07 VITALS — BP 108/64 | HR 86 | Ht 64.5 in | Wt 130.9 lb

## 2017-12-07 DIAGNOSIS — G8929 Other chronic pain: Secondary | ICD-10-CM

## 2017-12-07 DIAGNOSIS — E782 Mixed hyperlipidemia: Secondary | ICD-10-CM | POA: Diagnosis not present

## 2017-12-07 DIAGNOSIS — M25561 Pain in right knee: Secondary | ICD-10-CM

## 2017-12-07 DIAGNOSIS — D649 Anemia, unspecified: Secondary | ICD-10-CM

## 2017-12-07 DIAGNOSIS — M810 Age-related osteoporosis without current pathological fracture: Secondary | ICD-10-CM

## 2017-12-07 DIAGNOSIS — E559 Vitamin D deficiency, unspecified: Secondary | ICD-10-CM

## 2017-12-07 NOTE — Assessment & Plan Note (Signed)
Check lipids fasting today 

## 2017-12-07 NOTE — Patient Instructions (Addendum)
Request copies of the xray reports from [facility] Consider Prolia or Forteo to help strengthen We'll have you see the endocrinologist about your bones We'll have you see PT for the knee Please call [phone] to schedule your imaging test (knee MRI) Please wait 2-3 days after the order has been placed to call and get your test scheduled Try turmeric as a natural anti-inflammatory (for pain and arthritis). It comes in capsules where you buy aspirin and fish oil, but also as a spice where you buy pepper and garlic powder.   Fall Prevention in the Home Falls can cause injuries and can affect people from all age groups. There are many simple things that you can do to make your home safe and to help prevent falls. What can I do on the outside of my home?  Regularly repair the edges of walkways and driveways and fix any cracks.  Remove high doorway thresholds.  Trim any shrubbery on the main path into your home.  Use bright outdoor lighting.  Clear walkways of debris and clutter, including tools and rocks.  Regularly check that handrails are securely fastened and in good repair. Both sides of any steps should have handrails.  Install guardrails along the edges of any raised decks or porches.  Have leaves, snow, and ice cleared regularly.  Use sand or salt on walkways during winter months.  In the garage, clean up any spills right away, including grease or oil spills. What can I do in the bathroom?  Use night lights.  Install grab bars by the toilet and in the tub and shower. Do not use towel bars as grab bars.  Use non-skid mats or decals on the floor of the tub or shower.  If you need to sit down while you are in the shower, use a plastic, non-slip stool.  Keep the floor dry. Immediately clean up any water that spills on the floor.  Remove soap buildup in the tub or shower on a regular basis.  Attach bath mats securely with double-sided non-slip rug tape.  Remove  throw rugs and other tripping hazards from the floor. What can I do in the bedroom?  Use night lights.  Make sure that a bedside light is easy to reach.  Do not use oversized bedding that drapes onto the floor.  Have a firm chair that has side arms to use for getting dressed.  Remove throw rugs and other tripping hazards from the floor. What can I do in the kitchen?  Clean up any spills right away.  Avoid walking on wet floors.  Place frequently used items in easy-to-reach places.  If you need to reach for something above you, use a sturdy step stool that has a grab bar.  Keep electrical cables out of the way.  Do not use floor polish or wax that makes floors slippery. If you have to use wax, make sure that it is non-skid floor wax.  Remove throw rugs and other tripping hazards from the floor. What can I do in the stairways?  Do not leave any items on the stairs.  Make sure that there are handrails on both sides of the stairs. Fix handrails that are broken or loose. Make sure that handrails are as long as the stairways.  Check any carpeting to make sure that it is firmly attached to the stairs. Fix any carpet that is loose or worn.  Avoid having throw rugs at the top or bottom of stairways, or secure  the rugs with carpet tape to prevent them from moving.  Make sure that you have a light switch at the top of the stairs and the bottom of the stairs. If you do not have them, have them installed. What are some other fall prevention tips?  Wear closed-toe shoes that fit well and support your feet. Wear shoes that have rubber soles or low heels.  When you use a stepladder, make sure that it is completely opened and that the sides are firmly locked. Have someone hold the ladder while you are using it. Do not climb a closed stepladder.  Add color or contrast paint or tape to grab bars and handrails in your home. Place contrasting color strips on the first and last steps.  Use  mobility aids as needed, such as canes, walkers, scooters, and crutches.  Turn on lights if it is dark. Replace any light bulbs that burn out.  Set up furniture so that there are clear paths. Keep the furniture in the same spot.  Fix any uneven floor surfaces.  Choose a carpet design that does not hide the edge of steps of a stairway.  Be aware of any and all pets.  Review your medicines with your healthcare provider. Some medicines can cause dizziness or changes in blood pressure, which increase your risk of falling. Talk with your health care provider about other ways that you can decrease your risk of falls. This may include working with a physical therapist or trainer to improve your strength, balance, and endurance. This information is not intended to replace advice given to you by your health care provider. Make sure you discuss any questions you have with your health care provider. Document Released: 04/03/2002 Document Revised: 09/10/2015 Document Reviewed: 05/18/2014 Elsevier Interactive Patient Education  2018 [place] your soy milk calcium content nutritional information Consider almond milk for calcium

## 2017-12-07 NOTE — Assessment & Plan Note (Signed)
Check today 

## 2017-12-07 NOTE — Assessment & Plan Note (Signed)
Check HH

## 2017-12-07 NOTE — Progress Notes (Signed)
BP 108/64   Pulse 86   Ht 5' 4.5" (1.638 m)   Wt 130 lb 14.4 oz (59.4 kg)   SpO2 93%   BMI 22.12 kg/m    Subjective:    Patient ID: Stacey Munoz, female    DOB: [birth year], 72 y.o.   MRN: [id number]  HPI: Stacey Munoz is a 72 y.o. female  Chief Complaint  Patient presents with  . Follow-up  . Knee Pain    right    HPI She is new to me She was in the [employer] in [place]; had torn meniscus on the right, 2012 and had surgery to repair it The past few months, it has been 7 out of 10 pain Went to urgent care; saw two providers; they did xrays; in the lower area and lateral aspect Dr. Mack Guise; [facility]; two months ago Since then fluctuates, comes and goes Wants to know if bone on bone; wants to hike in [place] next summer and wants to make sure she can do it Open to PT No clicking or popping or locking No swelling Does not turn red or feel hot No effusions, no fluid collection She has tried stretching before and after walking She has the huge leg brace that is neoprene Ice helps but just temporarily She has taken maybe one tylenol three different times, weeks apart Came down from tall horse   Osteoporosis; -3.3 T score femoral neck in April 2019 She took fosamax but caused severe pain in the muscles, maybe 2002 or 2003 Vit D 31 last check in Feb, taking vitamin D3 1,000 iu daily, also in calcium tablets, once every two days  High cholesterol Last LDL 182; she has tried to give up ice cream; oatmeal, almonds, flax seed oil, chia seeds; oat bran No red meat; no fried foods; mother had very high cholesterol  Lab Results  Component Value Date   CHOL 253 (H) 12/07/2017   HDL 64 12/07/2017   LDLCALC 168 (H) 12/07/2017   TRIG 99 12/07/2017   CHOLHDL 4.0 12/07/2017     Depression screen PHQ 2/9 12/07/2017 05/31/2017 05/18/2017 11/05/2016 05/11/2016  Decreased Interest 0 0 0 0 0  Down, Depressed, Hopeless 0 0 0 0 1  PHQ - 2 Score 0 0 0 0 1    Relevant past medical, surgical, family and social history reviewed Past Medical History:  Diagnosis Date  . Hyperlipidemia   . Low serum vitamin D    Past Surgical History:  Procedure Laterality Date  . TRIGGER FINGER RELEASE Right May 2016.  . TUBAL LIGATION    . WRIST FRACTURE SURGERY     Family History  Problem Relation Age of Onset  . Breast cancer Mother 3  . Diabetes Father   . Cancer Father   . Heart disease Brother   . Clotting disorder Brother   . Breast cancer Paternal Grandmother        PT think it was cancer; never got check   . Kidney disease Maternal Grandmother   . Cancer Maternal Grandfather   . Gout Son   . Heart disease Son    Social History   Tobacco Use  . Smoking status: Never Smoker  . Smokeless tobacco: Never Used  . Tobacco comment: smoking cessation materials not required  Substance Use Topics  . Alcohol use: Yes    Alcohol/week: 1.0 standard drinks    Types: 1 Glasses of wine per week    Frequency: Never  Comment: rare  . Drug use: No    Interim medical history since last visit reviewed. Allergies and medications reviewed  Review of Systems Per HPI unless specifically indicated above     Objective:    BP 108/64   Pulse 86   Ht 5' 4.5" (1.638 m)   Wt 130 lb 14.4 oz (59.4 kg)   SpO2 93%   BMI 22.12 kg/m   Wt Readings from Last 3 Encounters:  12/07/17 130 lb 14.4 oz (59.4 kg)  09/08/17 137 lb 12.8 oz (62.5 kg)  07/06/17 136 lb (61.7 kg)    Physical Exam  Constitutional: She appears well-developed and well-nourished.  HENT:  Mouth/Throat: Mucous membranes are normal.  Eyes: EOM are normal. No scleral icterus.  Cardiovascular: Normal rate and regular rhythm.  Pulmonary/Chest: Effort normal and breath sounds normal.  Psychiatric: She has a normal mood and affect. Her behavior is normal.       Assessment & Plan:   Problem List Items Addressed This Visit      Musculoskeletal and Integument   Osteoporosis    Relevant Orders   Ambulatory referral to Endocrinology   Basic metabolic panel (Completed)     Other   Vitamin D deficiency    Check today      Relevant Orders   VITAMIN D 25 Hydroxy (Vit-D Deficiency, Fractures) (Completed)   Low hemoglobin    Check H/H      Relevant Orders   CBC with Differential/Platelet (Completed)   Hyperlipidemia    Check lipids fasting today      Relevant Orders   Lipid panel (Completed)    Other Visit Diagnoses    Chronic pain of right knee    -  Primary   Relevant Orders   Ambulatory referral to Physical Therapy   MR Knee Right Wo Contrast       Follow up plan: No follow-ups on file.  An after-visit summary was printed and given to the patient at [facility].  Please see the patient instructions which may contain other information and recommendations beyond what is mentioned above in the assessment and plan.  No orders of the defined types were placed in this encounter.   Orders Placed This Encounter  Procedures  . MR Knee Right Wo Contrast  . Lipid panel  . VITAMIN D 25 Hydroxy (Vit-D Deficiency, Fractures)  . Basic metabolic panel  . CBC with Differential/Platelet  . Ambulatory referral to Physical Therapy  . Ambulatory referral to Endocrinology

## 2017-12-08 LAB — CBC WITH DIFFERENTIAL/PLATELET
BASOS PCT: 0.5 %
Basophils Absolute: 21 cells/uL (ref 0–200)
EOS ABS: 111 {cells}/uL (ref 15–500)
Eosinophils Relative: 2.7 %
HCT: 38.9 % (ref 35.0–45.0)
HEMOGLOBIN: 13.5 g/dL (ref 11.7–15.5)
Lymphs Abs: 1599 cells/uL (ref 850–3900)
MCH: 30.8 pg (ref 27.0–33.0)
MCHC: 34.7 g/dL (ref 32.0–36.0)
MCV: 88.6 fL (ref 80.0–100.0)
MPV: 10 fL (ref 7.5–12.5)
Monocytes Relative: 6.2 %
NEUTROS ABS: 2116 {cells}/uL (ref 1500–7800)
Neutrophils Relative %: 51.6 %
Platelets: 242 10*3/uL (ref 140–400)
RBC: 4.39 10*6/uL (ref 3.80–5.10)
RDW: 13 % (ref 11.0–15.0)
Total Lymphocyte: 39 %
WBC: 4.1 10*3/uL (ref 3.8–10.8)
WBCMIX: 254 {cells}/uL (ref 200–950)

## 2017-12-08 LAB — LIPID PANEL
Cholesterol: 253 mg/dL — ABNORMAL HIGH (ref <200)
HDL: 64 mg/dL (ref >50)
LDL CHOLESTEROL (CALC): 168 mg/dL — AB
NON-HDL CHOLESTEROL (CALC): 189 mg/dL — AB (ref <130)
TRIGLYCERIDES: 99 mg/dL (ref <150)
Total CHOL/HDL Ratio: 4 (calc) (ref <5.0)

## 2017-12-08 LAB — BASIC METABOLIC PANEL
BUN: 13 mg/dL (ref 7–25)
CALCIUM: 9.9 mg/dL (ref 8.6–10.4)
CHLORIDE: 104 mmol/L (ref 98–110)
CO2: 26 mmol/L (ref 20–32)
Creat: 0.7 mg/dL (ref 0.60–0.93)
Glucose, Bld: 94 mg/dL (ref 65–99)
Potassium: 4 mmol/L (ref 3.5–5.3)
Sodium: 139 mmol/L (ref 135–146)

## 2017-12-08 LAB — VITAMIN D 25 HYDROXY (VIT D DEFICIENCY, FRACTURES): Vit D, 25-Hydroxy: 37 ng/mL (ref 30–100)

## 2017-12-10 ENCOUNTER — Telehealth: Payer: Self-pay | Admitting: Family Medicine

## 2017-12-10 DIAGNOSIS — M25561 Pain in right knee: Principal | ICD-10-CM

## 2017-12-10 DIAGNOSIS — G8929 Other chronic pain: Secondary | ICD-10-CM

## 2017-12-10 NOTE — Telephone Encounter (Signed)
Patient called unable to reach her left vm in regards to MRI being denied. Referral to ortho and PT done. Patient will decline if they call and she does not want these referrals.

## 2017-12-10 NOTE — Telephone Encounter (Signed)
Copied from [place]. Topic: Quick Communication - See Telephone Encounter >> Dec 10, 2017 11:53 AM Nils Flack wrote: CRM for notification. See Telephone encounter for: 12/10/17. Prior Josem Kaufmann is denied for mri knee for pt.  They need to have copies of the xrays and also they need pt to try NSAIDs

## 2017-12-10 NOTE — Telephone Encounter (Signed)
Please let patient know that unfortunately her insurance company has denied my MRI We'll ask her to consider seeing an orthopaedist who will likely have more success in getting this covered Please REFER to ortho of choice In my experience, the insurance company may also want her to do six weeks of physical therapy sessions before they will approve; please REFER to PT for chronic right knee pain if she agrees Lastly, the insurance company wants her to try NSAIDs She can take a low dose aleve 220 mg twice a day with food; that has the lowest cardiac risk of the class and is inexpensive and OTC Thank you

## 2017-12-29 ENCOUNTER — Ambulatory Visit: Payer: Medicare HMO | Attending: Family Medicine

## 2017-12-29 DIAGNOSIS — G8929 Other chronic pain: Secondary | ICD-10-CM | POA: Diagnosis present

## 2017-12-29 DIAGNOSIS — M25561 Pain in right knee: Secondary | ICD-10-CM | POA: Insufficient documentation

## 2017-12-29 DIAGNOSIS — M6281 Muscle weakness (generalized): Secondary | ICD-10-CM | POA: Diagnosis present

## 2017-12-29 NOTE — Therapy (Signed)
[facility] [address] [place], [address] Phone: [phone]   Fax:  [phone]  Physical Therapy Evaluation  Patient Details  Name: Stacey Munoz MRN: [id number] Date of Birth: [birth year] Referring Provider: Enid Derry, MD   Encounter Date: 12/29/2017  PT End of Session - 12/29/17 1453    Visit Number  1    Number of Visits  13    Date for PT Re-Evaluation  02/10/18    PT Start Time  3382    PT Stop Time  1558    PT Time Calculation (min)  65 min    Activity Tolerance  Patient tolerated treatment well    Behavior During Therapy  [facility] for tasks assessed/performed       Past Medical History:  Diagnosis Date  . Hyperlipidemia   . Low serum vitamin D     Past Surgical History:  Procedure Laterality Date  . TRIGGER FINGER RELEASE Right May 2016.  . TUBAL LIGATION    . WRIST FRACTURE SURGERY      There were no vitals filed for this visit.   Subjective Assessment - 12/29/17 1456    Subjective  R knee pain: 2/10 R knee pain currently.     Pertinent History  Chronic R knee pain. 2012, pt volunteered at [employer], pt underwent arthroscopic surgery for a torn meniscus.  Pt loves to hike in the mountains, walk and recently took up horse back riding.  Pt started having pain in her low back. Pt also got off a tall horse and jammed her R LE which caused R knee pain for months.  No fracture per imaging.  Pain is usually R lateral knee at proximal tibia.  Walking 2x/day for 1.5 miles each does not bother her R knee. Wore a huge brace from her 2012 surgery.  Used her brace again for her R knee to walk and move around.  Pain is not as bad as before but the pain is persistent. Would like to hike up to 5-6 miles and hike in [place].  Also had a bone density test which showed the beginning of osteoporosis.   Pt also states that her L hip is higher than her R.   Pt also stopped horse back riding due to her low  back bones degenerating and her R knee pain.   Pt injured her R knee this past spring (2019) when she landed on her R LE getting off her horse.  Denies clicking, popping or locking in R knee.     Patient Stated Goals  Be able to hike longer distances without serious pain. Be able to travel more comfortably for her R knee.     Currently in Pain?  Yes    Pain Score  2     Pain Location  Knee    Pain Orientation  Right    Pain Descriptors / Indicators  Aching;Constant    Pain Type  Acute pain    Pain Onset  More than a month ago    Pain Frequency  Occasional    Aggravating Factors   hiking 3-4 miles, feels soreness R anterior knee (distal patella, patellar tendon, proximal tibia), pressing on the gas pedal, extending her R knee    Pain Relieving Factors  ice helps for about 1 hour, Tylenol, propping her R knee with a pillow         [facility] PT Assessment - 12/29/17 1514  Assessment   Medical Diagnosis  Chronic R knee pain    Referring Provider  Enid Derry, MD    Onset Date/Surgical Date  12/14/17   Date PT referral signed. Chronic condition   Prior Therapy  No known PT for current condition      Precautions   Precaution Comments  Beginning stages of osteoporosis      Restrictions   Other Position/Activity Restrictions  no known weight bearing restrictions      Prior Function   Vocation Requirements  PLOF: better able to hike long distances, press the gas pedal while driving, with less knee pain    Leisure  hiking, horse back riding, traveling      Observation/Other Assessments   Observations  (+) long sit test L LE suggesting anterior nutation L innominate.  (-) valgus, varus stress test, (-) lachmans and posterior drawer tests, (-) SLR R LE      Posture/Postural Control   Posture Comments  protracted neck, L shoulder lower, bilateral scapular winging L > R, decreased lumbar lordosis, L iliac crest higher, L greater trochanter lower,       AROM   Lumbar Flexion  Full, no  pain    Lumbar Extension  limited     Lumbar - Right Side Bend  limited    Lumbar - Left Side Bend  limited    Lumbar - Right Rotation  WFL    Lumbar - Left Rotation  [facility]      Strength   Right Hip Flexion  4-/5    Right Hip Extension  4/5    Right Hip ABduction  4+/5    Left Hip Flexion  4/5    Left Hip Extension  4/5    Left Hip ABduction  4/5    Right Knee Flexion  4/5    Right Knee Extension  5/5    Left Knee Flexion  4/5    Left Knee Extension  5/5    Right Ankle Dorsiflexion  4+/5    Left Ankle Dorsiflexion  4+/5      Palpation   Palpation comment  good medial and lateral patellar mobility. Slight decreased A to P glide proximal tib/fib joint       Ambulation/Gait   Gait Comments  Bilateral pelvic drop during stance phase, R femoral adduction and IR during R LE stance phase.  Decreased femoral control R > L with stair negotiation.                 Objective measurements completed on examination: See above findings.   Does yoga at home    R lateral knee ache  Manual therapy   Supine gentle IR R tibia, ER R femur grade 1 to 3- for pain control  1-2 R knee pain afterwards. No change.    Work on Product/process development scientist and femoral control   Try ankle DF strengthening next visit if appropriate   Check hip IR at 90/90 next visit if appropriate     Patient is a 72 year old female who came to physical therapy secondary to R chronic knee pain. She also presents with altered gait pattern and posture, hip weakness, decreased bilateral femoral control R > L, positive special test suggesting lumbopelvic involvement, and difficulty pefroming functional tasks such as pushing on the gas pedal while driving, and walking long distances. Pt will benefit from skilled physical therapy services to address the aforementinoed deficits.          PT Education -  12/29/17 1858    Education Details  plan of care    Person(s) Educated  Patient    Methods  Explanation     Comprehension  Verbalized understanding       PT Short Term Goals - 12/29/17 1903      PT SHORT TERM GOAL #1   Title  Pt will be independent with her HEP to promote ability to ambulate longer distances, drive more comfortably for her knee.     Time  3    Period  Weeks    Status  New    Target Date  01/21/18        PT Long Term Goals - 12/29/17 1904      PT LONG TERM GOAL #1   Title  Patient will report overall decreased R knee pain at worst  to promote ability to ambulate longer distances, drive more comfortably for her knee.     Baseline  Increased R knee pain with pressing the gas pedal to drive, as well as hiking about 3-4 miles; 7/10 for the past few months based on MD notes on 12/07/17 (12/29/2017)    Time  6    Period  Weeks    Status  New    Target Date  02/10/18      PT LONG TERM GOAL #2   Title  Patient will improve bilateral hip strength to promote femoral control and improve ability to perform standing tasks with less R knee pain.     Time  6    Period  Weeks    Status  New    Target Date  02/10/18      PT LONG TERM GOAL #3   Title  Pt will report being able to hike 4 miles with decreased overall R knee discomfort to promote fitness and ability to perform liesure activities.     Baseline  Hiking 4 miles aggravates R knee pain (12/29/2017)    Time  6    Period  Weeks    Status  New    Target Date  02/10/18             Plan - 12/29/17 1853    Clinical Impression Statement  Patient is a 72 year old female who came to physical therapy secondary to R chronic knee pain. She also presents with altered gait pattern and posture, hip weakness, decreased bilateral femoral control R > L, positive special test suggesting lumbopelvic involvement, and difficulty pefroming functional tasks such as pushing on the gas pedal while driving, and walking long distances. Pt will benefit from skilled physical therapy services to address the aforementinoed deficits.      History and  Personal Factors relevant to plan of care:  Chronicity of R knee pain, begining stage of osteoporosis, hip weakness    Clinical Presentation  Stable    Clinical Presentation due to:  Pain is not as bad as before but persistent per pt    Clinical Decision Making  Low    Rehab Potential  Fair    Clinical Impairments Affecting Rehab Potential  (-) chronicity of condition, age, beginning stage of osteoporosis; (+) motivated    PT Frequency  2x / week    PT Duration  6 weeks    PT Treatment/Interventions  Neuromuscular re-education;Therapeutic activities;Therapeutic exercise;Patient/family education;Manual techniques;Dry needling;Aquatic Therapy;Electrical Stimulation;Iontophoresis 4mg /ml Dexamethasone;Gait training    PT Next Visit Plan  hip strengthening, femoral control, manual techniques, modalities PRN    Consulted and Agree with  Plan of Care  Patient       Patient will benefit from skilled therapeutic intervention in order to improve the following deficits and impairments:  Pain, Postural dysfunction, Improper body mechanics, Difficulty walking, Decreased strength  Visit Diagnosis: Chronic pain of right knee - Plan: PT plan of care cert/re-cert  Muscle weakness (generalized) - Plan: PT plan of care cert/re-cert     Problem List Patient Active Problem List   Diagnosis Date Noted  . Osteoporosis 08/24/2017  . DNR (do not resuscitate) discussion 05/23/2015  . Diminished hearing 01/22/2015  . Low hemoglobin 10/15/2014  . Vitamin D deficiency 10/15/2014  . Hyperlipidemia 10/15/2014  . Retained orthopedic hardware 04/14/2013  . Triggering of digit 02/03/2013   Joneen Boers PT, DPT   12/29/2017, 7:28 PM   [facility] [address] [place], [address] Phone: [phone]   Fax:  [phone]  Name: Stacey Munoz MRN: [id number] Date of Birth: 1945-11-02

## 2018-01-05 ENCOUNTER — Ambulatory Visit: Payer: Medicare HMO

## 2018-01-11 ENCOUNTER — Ambulatory Visit: Payer: Medicare HMO

## 2018-01-11 DIAGNOSIS — M25561 Pain in right knee: Principal | ICD-10-CM

## 2018-01-11 DIAGNOSIS — M6281 Muscle weakness (generalized): Secondary | ICD-10-CM

## 2018-01-11 DIAGNOSIS — G8929 Other chronic pain: Secondary | ICD-10-CM

## 2018-01-11 NOTE — Therapy (Signed)
[facility] [address], [place], [address] Phone: [phone]   Fax:  [phone]  Physical Therapy Treatment  Patient Details  Name: Stacey Munoz MRN: [id number] Date of Birth: February 25, 1946 Referring Provider: Enid Derry, MD   Encounter Date: 01/11/2018  PT End of Session - 01/11/18 0853    Visit Number  2    Number of Visits  13    Date for PT Re-Evaluation  02/10/18    PT Start Time  0853    PT Stop Time  0950    PT Time Calculation (min)  57 min    Activity Tolerance  Patient tolerated treatment well    Behavior During Therapy  [facility] for tasks assessed/performed       Past Medical History:  Diagnosis Date  . Hyperlipidemia   . Low serum vitamin D     Past Surgical History:  Procedure Laterality Date  . TRIGGER FINGER RELEASE Right May 2016.  . TUBAL LIGATION    . WRIST FRACTURE SURGERY      There were no vitals filed for this visit.  Subjective Assessment - 01/11/18 0854    Subjective  Had visitors. Has not been able to exercise. R knee does not hurt as much. Walked for about 2 miles. Has been focusing on keeping her knees straight and tensing her buttocks when going up and down stairs.  No R knee pain currently.     Pertinent History  Chronic R knee pain. 2012, pt volunteered at [employer], pt underwent arthroscopic surgery for a torn meniscus.  Pt loves to hike in the mountains, walk and recently took up horse back riding.  Pt started having pain in her low back. Pt also got off a tall horse and jammed her R LE which caused R knee pain for months.  No fracture per imaging.  Pain is usually R lateral knee at proximal tibia.  Walking 2x/day for 1.5 miles each does not bother her R knee. Wore a huge brace from her 2012 surgery.  Used her brace again for her R knee to walk and move around.  Pain is not as bad as before but the pain is persistent. Would like to hike up to 5-6 miles and hike in  [place].  Also had a bone density test which showed the beginning of osteoporosis.   Pt also states that her L hip is higher than her R.   Pt also stopped horse back riding due to her low back bones degenerating and her R knee pain.   Pt injured her R knee this past spring (2019) when she landed on her R LE getting off her horse.  Denies clicking, popping or locking in R knee.     Patient Stated Goals  Be able to hike longer distances without serious pain. Be able to travel more comfortably for her R knee.     Currently in Pain?  No/denies    Pain Score  0-No pain    Pain Onset  More than a month ago         [facility] PT Assessment - 01/11/18 0858      Observation/Other Assessments   Observations  Supine hip IR at 90/90: R 35 degrees, L 35 degrees; hip ER: 41 degrees R, 50 degrees L  PT Education - 01/11/18 0913    Education Details  ther-ex, HEP    Person(s) Educated  Patient    Methods  Explanation;Demonstration;Tactile cues;Verbal cues;Handout    Comprehension  Verbalized understanding;Returned demonstration        Objective  Medbridge Access Code: [email or web address]    R lateral knee ache  Therapeutic exercise  Supine 90/90   Hip ER  Hip IR   Supine R hip IR stretch with PT   Improved R hip IR ROM to 41 degrees  Supine L hip extension isometrics in SKTC position 10x5 seconds for 2 sets  Seated L hip extension isometrics 10x2 with 5 second holds   Forward step up onto Dyna disc with R LE, emphasis on pelvic and femoral control, one UE assist 10x3  Standing R LE leg press resisting blue band 10x3   Side stepping yellow band around distal thighs 32 ft to the R and 32 ft to the L 2x   Forward wedding march yellow band at thighs 32 ft forward 2x,   32 ft backward 2x  Reviewed HEP. Pt demonstrated and verbalized understanding.    Try ankle DF strengthening next visit if appropriate    Improved exercise technique, movement at  target joints, use of target muscles after mod verbal, visual, tactile cues.    Manual therapy  Seated STM R lateral hamstrings to decrease tension   Worked on L glute max muscle strengthening to promote more even lumbopelvic and B LE posture when walking to help decrease R lateral knee pain. Also worked on decreasing R lateral hamstrings tension, and improving femoral control to help decrease R lateral knee pain with standing tasks. Pt tolerated session well without aggravation of symptoms.        PT Short Term Goals - 12/29/17 1903      PT SHORT TERM GOAL #1   Title  Pt will be independent with her HEP to promote ability to ambulate longer distances, drive more comfortably for her knee.     Time  3    Period  Weeks    Status  New    Target Date  01/21/18        PT Long Term Goals - 12/29/17 1904      PT LONG TERM GOAL #1   Title  Patient will report overall decreased R knee pain at worst  to promote ability to ambulate longer distances, drive more comfortably for her knee.     Baseline  Increased R knee pain with pressing the gas pedal to drive, as well as hiking about 3-4 miles; 7/10 for the past few months based on MD notes on 12/07/17 (12/29/2017)    Time  6    Period  Weeks    Status  New    Target Date  02/10/18      PT LONG TERM GOAL #2   Title  Patient will improve bilateral hip strength to promote femoral control and improve ability to perform standing tasks with less R knee pain.     Time  6    Period  Weeks    Status  New    Target Date  02/10/18      PT LONG TERM GOAL #3   Title  Pt will report being able to hike 4 miles with decreased overall R knee discomfort to promote fitness and ability to perform liesure activities.     Baseline  Hiking 4 miles aggravates R knee pain (12/29/2017)  Time  6    Period  Weeks    Status  New    Target Date  02/10/18            Plan - 01/11/18 0913    Clinical Impression Statement  Worked on L glute max muscle  strengthening to promote more even lumbopelvic and B LE posture when walking to help decrease R lateral knee pain. Also worked on decreasing R lateral hamstrings tension, and improving femoral control to help decrease R lateral knee pain with standing tasks. Pt tolerated session well without aggravation of symptoms.     Rehab Potential  Fair    Clinical Impairments Affecting Rehab Potential  (-) chronicity of condition, age, beginning stage of osteoporosis; (+) motivated    PT Frequency  2x / week    PT Duration  6 weeks    PT Treatment/Interventions  Neuromuscular re-education;Therapeutic activities;Therapeutic exercise;Patient/family education;Manual techniques;Dry needling;Aquatic Therapy;Electrical Stimulation;Iontophoresis 4mg /ml Dexamethasone;Gait training    PT Next Visit Plan  hip strengthening, femoral control, manual techniques, modalities PRN    Consulted and Agree with Plan of Care  Patient       Patient will benefit from skilled therapeutic intervention in order to improve the following deficits and impairments:  Pain, Postural dysfunction, Improper body mechanics, Difficulty walking, Decreased strength  Visit Diagnosis: Chronic pain of right knee  Muscle weakness (generalized)     Problem List Patient Active Problem List   Diagnosis Date Noted  . Osteoporosis 08/24/2017  . DNR (do not resuscitate) discussion 05/23/2015  . Diminished hearing 01/22/2015  . Low hemoglobin 10/15/2014  . Vitamin D deficiency 10/15/2014  . Hyperlipidemia 10/15/2014  . Retained orthopedic hardware 04/14/2013  . Triggering of digit 02/03/2013   Joneen Boers PT, DPT   01/11/2018, 10:20 AM  [facility] [address], [place], [address] Phone: [phone]   Fax:  [phone]  Name: Kamaryn Grimley MRN: [id number] Date of Birth: 04/30/1945

## 2018-01-11 NOTE — Patient Instructions (Signed)
Medbridge Access Code: [email or web address]   Side Stepping with Resistance at Thighs   30 ft to the R and 30 ft to the L for 2 sets

## 2018-01-17 ENCOUNTER — Ambulatory Visit: Payer: Medicare HMO

## 2018-01-17 DIAGNOSIS — G8929 Other chronic pain: Secondary | ICD-10-CM

## 2018-01-17 DIAGNOSIS — M25561 Pain in right knee: Principal | ICD-10-CM

## 2018-01-17 DIAGNOSIS — M6281 Muscle weakness (generalized): Secondary | ICD-10-CM

## 2018-01-17 NOTE — Patient Instructions (Addendum)
Keep thighs in neutral for exercises.     Sit to stand  With green band around your thighs   Stand up and sit down onto a chair without letting your thighs turn in   Perform 10 times   Do 3 sets daily.        Bridge    Green band around your thighs, just above your knees  Lie on back, legs bent. Squeeze your rear end muscles.   Lift hips up. Repeat __10__ times. Do __3__ sessions per day.  Copyright  VHI. All rights reserved.

## 2018-01-17 NOTE — Therapy (Signed)
[facility] [address] [place], [address] Phone: [phone]   Fax:  [phone]  Physical Therapy Treatment  Patient Details  Name: Stacey Munoz MRN: [id number] Date of Birth: 1945/10/02 Referring Provider: Enid Derry, MD   Encounter Date: 01/17/2018  PT End of Session - 01/17/18 0850    Visit Number  3    Number of Visits  13    Date for PT Re-Evaluation  02/10/18    PT Start Time  0850    PT Stop Time  0930    PT Time Calculation (min)  40 min    Activity Tolerance  Patient tolerated treatment well    Behavior During Therapy  [facility] for tasks assessed/performed       Past Medical History:  Diagnosis Date  . Hyperlipidemia   . Low serum vitamin D     Past Surgical History:  Procedure Laterality Date  . TRIGGER FINGER RELEASE Right May 2016.  . TUBAL LIGATION    . WRIST FRACTURE SURGERY      There were no vitals filed for this visit.  Subjective Assessment - 01/17/18 0851    Subjective  Has been trying to keep her knees from turning in.  Feels like she wobbles when she walks. Has had company from [place] all week and has not been able to do her exercises.  No R knee pain currently but has not been able to walk and drive for long periods at a time.     Pertinent History  Chronic R knee pain. 2012, pt volunteered at [employer], pt underwent arthroscopic surgery for a torn meniscus.  Pt loves to hike in the mountains, walk and recently took up horse back riding.  Pt started having pain in her low back. Pt also got off a tall horse and jammed her R LE which caused R knee pain for months.  No fracture per imaging.  Pain is usually R lateral knee at proximal tibia.  Walking 2x/day for 1.5 miles each does not bother her R knee. Wore a huge brace from her 2012 surgery.  Used her brace again for her R knee to walk and move around.  Pain is not as bad as before but the pain is persistent. Would like to  hike up to 5-6 miles and hike in [place].  Also had a bone density test which showed the beginning of osteoporosis.   Pt also states that her L hip is higher than her R.   Pt also stopped horse back riding due to her low back bones degenerating and her R knee pain.   Pt injured her R knee this past spring (2019) when she landed on her R LE getting off her horse.  Denies clicking, popping or locking in R knee.     Patient Stated Goals  Be able to hike longer distances without serious pain. Be able to travel more comfortably for her R knee.     Currently in Pain?  No/denies    Pain Score  0-No pain    Pain Onset  More than a month ago                                PT Education - 01/17/18 0904    Education Details  ther-ex, HEP    Person(s) Educated  Patient    Methods  Explanation;Demonstration;Tactile cues;Verbal cues;Handout  Comprehension  Returned demonstration;Verbalized understanding        Objective  Medbridge Access Code: [email or web address]    R lateral knee ache  Therapeutic exercise  Sit <> stand with green band around distal thighs 10x3 with emphasis on femoral control   Bridge with green band resisting hip abduction/ER 10x3   Supine L hip extension isometrics in SKTC position 10x5 seconds  Seated R hip ER resisting yellow band 10x2  Seated R knee flexion resisting yellow band targeting medial hamstrings 10x3  Improved exercise technique, movement at target joints, use of target muscles after min to mod verbal, visual, tactile cues.    Manual therapy  Supine STM R lateral hamstrings to decrease tension    Continued working on glute max strengthening, femoral control and decreasing lateral hamstrings tension to promote ability to ambulate and drive long distances more comfortably for her knee. Pt tolerated session well without aggravation of symptoms.           PT Short Term Goals - 12/29/17 1903      PT SHORT TERM GOAL #1    Title  Pt will be independent with her HEP to promote ability to ambulate longer distances, drive more comfortably for her knee.     Time  3    Period  Weeks    Status  New    Target Date  01/21/18        PT Long Term Goals - 12/29/17 1904      PT LONG TERM GOAL #1   Title  Patient will report overall decreased R knee pain at worst  to promote ability to ambulate longer distances, drive more comfortably for her knee.     Baseline  Increased R knee pain with pressing the gas pedal to drive, as well as hiking about 3-4 miles; 7/10 for the past few months based on MD notes on 12/07/17 (12/29/2017)    Time  6    Period  Weeks    Status  New    Target Date  02/10/18      PT LONG TERM GOAL #2   Title  Patient will improve bilateral hip strength to promote femoral control and improve ability to perform standing tasks with less R knee pain.     Time  6    Period  Weeks    Status  New    Target Date  02/10/18      PT LONG TERM GOAL #3   Title  Pt will report being able to hike 4 miles with decreased overall R knee discomfort to promote fitness and ability to perform liesure activities.     Baseline  Hiking 4 miles aggravates R knee pain (12/29/2017)    Time  6    Period  Weeks    Status  New    Target Date  02/10/18            Plan - 01/17/18 0849    Clinical Impression Statement  Continued working on glute max strengthening, femoral control and decreasing lateral hamstrings tension to promote ability to ambulate and drive long distances more comfortably for her knee. Pt tolerated session well without aggravation of symptoms.     Rehab Potential  Fair    Clinical Impairments Affecting Rehab Potential  (-) chronicity of condition, age, beginning stage of osteoporosis; (+) motivated    PT Frequency  2x / week    PT Duration  6 weeks    PT Treatment/Interventions  Neuromuscular re-education;Therapeutic  activities;Therapeutic exercise;Patient/family education;Manual techniques;Dry  needling;Aquatic Therapy;Electrical Stimulation;Iontophoresis 4mg /ml Dexamethasone;Gait training    PT Next Visit Plan  hip strengthening, femoral control, manual techniques, modalities PRN    Consulted and Agree with Plan of Care  Patient       Patient will benefit from skilled therapeutic intervention in order to improve the following deficits and impairments:  Pain, Postural dysfunction, Improper body mechanics, Difficulty walking, Decreased strength  Visit Diagnosis: Chronic pain of right knee  Muscle weakness (generalized)     Problem List Patient Active Problem List   Diagnosis Date Noted  . Osteoporosis 08/24/2017  . DNR (do not resuscitate) discussion 05/23/2015  . Diminished hearing 01/22/2015  . Low hemoglobin 10/15/2014  . Vitamin D deficiency 10/15/2014  . Hyperlipidemia 10/15/2014  . Retained orthopedic hardware 04/14/2013  . Triggering of digit 02/03/2013    Joneen Boers PT, DPT   01/17/2018, 7:36 PM  [facility] [address], [place], [address] Phone: [phone]   Fax:  [phone]  Name: Adelei Scobey MRN: [id number] Date of Birth: Apr 14, 1946

## 2018-01-19 ENCOUNTER — Ambulatory Visit: Payer: Medicare HMO

## 2018-01-19 DIAGNOSIS — M25561 Pain in right knee: Secondary | ICD-10-CM | POA: Diagnosis not present

## 2018-01-19 DIAGNOSIS — G8929 Other chronic pain: Secondary | ICD-10-CM

## 2018-01-19 DIAGNOSIS — M6281 Muscle weakness (generalized): Secondary | ICD-10-CM

## 2018-01-19 NOTE — Therapy (Signed)
[facility] [address] [place], [address] Phone: [phone]   Fax:  [phone]  Physical Therapy Treatment  Patient Details  Name: Stacey Munoz MRN: [id number] Date of Birth: Sep 21, 1945 Referring Provider: Enid Derry, MD   Encounter Date: 01/19/2018  PT End of Session - 01/19/18 0848    Visit Number  4    Number of Visits  13    Date for PT Re-Evaluation  02/10/18    Authorization Type  4    Authorization Time Period  of 10 Medicare Progress Report    PT Start Time  [phone]    PT Stop Time  0940    PT Time Calculation (min)  51 min    Activity Tolerance  Patient tolerated treatment well    Behavior During Therapy  [facility] for tasks assessed/performed       Past Medical History:  Diagnosis Date  . Hyperlipidemia   . Low serum vitamin D     Past Surgical History:  Procedure Laterality Date  . TRIGGER FINGER RELEASE Right May 2016.  . TUBAL LIGATION    . WRIST FRACTURE SURGERY      There were no vitals filed for this visit.  Subjective Assessment - 01/19/18 0850    Subjective  Stopped the supine bridge exercise due to medial knee pain bilaterally. R knee is fine. Still has not started back walking long distances yet.  No R knee pain currently.     Pertinent History  Chronic R knee pain. 2012, pt volunteered at [employer], pt underwent arthroscopic surgery for a torn meniscus.  Pt loves to hike in the mountains, walk and recently took up horse back riding.  Pt started having pain in her low back. Pt also got off a tall horse and jammed her R LE which caused R knee pain for months.  No fracture per imaging.  Pain is usually R lateral knee at proximal tibia.  Walking 2x/day for 1.5 miles each does not bother her R knee. Wore a huge brace from her 2012 surgery.  Used her brace again for her R knee to walk and move around.  Pain is not as bad as before but the pain is persistent. Would like to hike  up to 5-6 miles and hike in [place].  Also had a bone density test which showed the beginning of osteoporosis.   Pt also states that her L hip is higher than her R.   Pt also stopped horse back riding due to her low back bones degenerating and her R knee pain.   Pt injured her R knee this past spring (2019) when she landed on her R LE getting off her horse.  Denies clicking, popping or locking in R knee.     Patient Stated Goals  Be able to hike longer distances without serious pain. Be able to travel more comfortably for her R knee.     Currently in Pain?  No/denies    Pain Score  0-No pain    Pain Onset  More than a month ago                               PT Education - 01/19/18 0909    Education Details  ther-ex, HEP    Person(s) Educated  Patient    Methods  Explanation;Demonstration;Tactile cues;Verbal cues;Handout    Comprehension  Returned demonstration;Verbalized understanding  Objective  MedbridgeAccess Code: [email or web address]   R lateral knee ache   Manual therapy  Prone STM R lateral hamstrings to decrease tension     Therapeutic exercise  Prone glute max extension   R 10x5 seconds for 3 sets with quad extension isometrics to eliminate hamstring cramp  L 10x5 seconds for 3 sets   Static lunges onto Air ex pad  with green band resisting hip abd/ER  R 10x3  L 10x3  Cowboy walk with green band around thighs up and down garden incline area 4x up, 4x down, about 10 ft each direction.    Reviewed HEP. Pt demonstrated and verbalized understanding.    Improved exercise technique, movement at target joints, use of target muscles after min to mod verbal, visual, tactile cues.    Decreased L posterior hip discomfort with activation of glute muscles. Continued working on glute strengthening, femoral control and added uneven surfaces to promote ability to ambulate longer distances on even surfaces and hike with less R knee symptoms. Pt  tolerated session well without complain of R knee pain.     PT Short Term Goals - 12/29/17 1903      PT SHORT TERM GOAL #1   Title  Pt will be independent with her HEP to promote ability to ambulate longer distances, drive more comfortably for her knee.     Time  3    Period  Weeks    Status  New    Target Date  01/21/18        PT Long Term Goals - 12/29/17 1904      PT LONG TERM GOAL #1   Title  Patient will report overall decreased R knee pain at worst  to promote ability to ambulate longer distances, drive more comfortably for her knee.     Baseline  Increased R knee pain with pressing the gas pedal to drive, as well as hiking about 3-4 miles; 7/10 for the past few months based on MD notes on 12/07/17 (12/29/2017)    Time  6    Period  Weeks    Status  New    Target Date  02/10/18      PT LONG TERM GOAL #2   Title  Patient will improve bilateral hip strength to promote femoral control and improve ability to perform standing tasks with less R knee pain.     Time  6    Period  Weeks    Status  New    Target Date  02/10/18      PT LONG TERM GOAL #3   Title  Pt will report being able to hike 4 miles with decreased overall R knee discomfort to promote fitness and ability to perform liesure activities.     Baseline  Hiking 4 miles aggravates R knee pain (12/29/2017)    Time  6    Period  Weeks    Status  New    Target Date  02/10/18            Plan - 01/19/18 0920    Clinical Impression Statement  Decreased L posterior hip discomfort with activation of glute muscles. Continued working on glute strengthening, femoral control and added uneven surfaces to promote ability to ambulate longer distances on even surfaces and hike with less R knee symptoms. Pt tolerated session well without complain of R knee pain.     Rehab Potential  Fair    Clinical Impairments Affecting Rehab Potential  (-) chronicity of  condition, age, beginning stage of osteoporosis; (+) motivated    PT  Frequency  2x / week    PT Duration  6 weeks    PT Treatment/Interventions  Neuromuscular re-education;Therapeutic activities;Therapeutic exercise;Patient/family education;Manual techniques;Dry needling;Aquatic Therapy;Electrical Stimulation;Iontophoresis 4mg /ml Dexamethasone;Gait training    PT Next Visit Plan  hip strengthening, femoral control, manual techniques, modalities PRN    Consulted and Agree with Plan of Care  Patient       Patient will benefit from skilled therapeutic intervention in order to improve the following deficits and impairments:  Pain, Postural dysfunction, Improper body mechanics, Difficulty walking, Decreased strength  Visit Diagnosis: Chronic pain of right knee  Muscle weakness (generalized)     Problem List Patient Active Problem List   Diagnosis Date Noted  . Osteoporosis 08/24/2017  . DNR (do not resuscitate) discussion 05/23/2015  . Diminished hearing 01/22/2015  . Low hemoglobin 10/15/2014  . Vitamin D deficiency 10/15/2014  . Hyperlipidemia 10/15/2014  . Retained orthopedic hardware 04/14/2013  . Triggering of digit 02/03/2013    Joneen Boers PT, DPT   01/19/2018, 10:01 AM  [facility] [address] [place], [address] Phone: [phone]   Fax:  [phone]  Name: Stacey Munoz MRN: [id number] Date of Birth: 12-13-1945

## 2018-01-19 NOTE — Patient Instructions (Addendum)
Pt was recommended to continue to hold off on the bridge exercise for now due to medial knee pain with exercise  MedbridgeAccess Code: [email or web address] Prone Hip Extension with Bent Knee  10x3 with 5 second holds each LE   If pt felt safe with exercise, pt can perform cowboy walk up and down an incline with green band around thighs at home. Pt demonstrated and verbalized understanding.

## 2018-01-24 ENCOUNTER — Ambulatory Visit: Payer: Medicare HMO

## 2018-01-26 ENCOUNTER — Ambulatory Visit: Payer: Medicare HMO | Attending: Family Medicine

## 2018-01-26 DIAGNOSIS — M6281 Muscle weakness (generalized): Secondary | ICD-10-CM | POA: Insufficient documentation

## 2018-01-26 DIAGNOSIS — G8929 Other chronic pain: Secondary | ICD-10-CM | POA: Diagnosis present

## 2018-01-26 DIAGNOSIS — M25561 Pain in right knee: Secondary | ICD-10-CM | POA: Insufficient documentation

## 2018-01-26 NOTE — Therapy (Signed)
[facility] [address] [place], [address] Phone: [phone]   Fax:  [phone]  Physical Therapy Treatment  Patient Details  Name: Stacey Munoz MRN: [id number] Date of Birth: Dec 22, 1945 Referring Provider (PT): Enid Derry, MD   Encounter Date: 01/26/2018  PT End of Session - 01/26/18 0850    Visit Number  5    Number of Visits  13    Date for PT Re-Evaluation  02/10/18    Authorization Type  5    Authorization Time Period  of 10 Medicare Progress Report    PT Start Time  [phone]    PT Stop Time  0935    PT Time Calculation (min)  44 min    Activity Tolerance  Patient tolerated treatment well    Behavior During Therapy  [facility] for tasks assessed/performed       Past Medical History:  Diagnosis Date  . Hyperlipidemia   . Low serum vitamin D     Past Surgical History:  Procedure Laterality Date  . TRIGGER FINGER RELEASE Right May 2016.  . TUBAL LIGATION    . WRIST FRACTURE SURGERY      There were no vitals filed for this visit.  Subjective Assessment - 01/26/18 0852    Subjective  R knee is amazingly better. Walked a mile and a half this morning. Did not walk a straight 4 miles this weekend. Stopped and looked at views. Has not had the pain she had before. She is going to do her walking every day and see if that brings it on.   Feels tension/soreness L hip, especially when she wakes up in the morning. Does not feel as wobbly when she walks.  Tensing her buttocks helps her L hip discomfort eventually go away.     Pertinent History  Chronic R knee pain. 2012, pt volunteered at [employer], pt underwent arthroscopic surgery for a torn meniscus.  Pt loves to hike in the mountains, walk and recently took up horse back riding.  Pt started having pain in her low back. Pt also got off a tall horse and jammed her R LE which caused R knee pain for months.  No fracture per imaging.  Pain is usually R  lateral knee at proximal tibia.  Walking 2x/day for 1.5 miles each does not bother her R knee. Wore a huge brace from her 2012 surgery.  Used her brace again for her R knee to walk and move around.  Pain is not as bad as before but the pain is persistent. Would like to hike up to 5-6 miles and hike in [place].  Also had a bone density test which showed the beginning of osteoporosis.   Pt also states that her L hip is higher than her R.   Pt also stopped horse back riding due to her low back bones degenerating and her R knee pain.   Pt injured her R knee this past spring (2019) when she landed on her R LE getting off her horse.  Denies clicking, popping or locking in R knee.     Patient Stated Goals  Be able to hike longer distances without serious pain. Be able to travel more comfortably for her R knee.     Currently in Pain?  No/denies    Pain Score  0-No pain   R knee   Pain Onset  More than a month ago  PT Education - 01/26/18 1001    Education Details  ther-ex    Person(s) Educated  Patient    Methods  Explanation;Demonstration;Tactile cues;Verbal cues    Comprehension  Returned demonstration;Verbalized understanding       Objective  MedbridgeAccess Code: [email or web address]    Therapeutic exercise   Seated hip adduction ball and glute max squeeze 10x5 seconds for 2 sets. Slight decreased in L hip discomfort   Seated L hip extension isometrics 10x5 seconds for 2 sets  Prone glute max extension              R 10x5 seconds for 2 sets with quad extension isometrics to eliminate hamstring cramp             L 10x5 seconds for 2 sets  Static lunges onto Dyna disc  with green band resisting hip abd/ER             R 10x3             L 10x3  Demonstrates less femoral control L LE  Standing mini squats on dyna disc with green band around thighs with light touch assist 10x2  Single leg mini squats 5x2 each LE, emphasis on femoral  control with green band around thighs. Demonstrates less femoral control L LE compared to R.   Improved exercise technique, movement at target joints, use of target muscles after min to mod verbal, visual, tactile cues.   Pt making very good progress with decreased R knee pain with walking longer distances based on subjective reports. Still demonstrates L hip discomfort and continued working on glute strengthening to help address. Demonstrates difficulty with L femoral control > R when performing standing exercises. Pt tolerated session well without aggravation of symptoms. Patient will benefit from continued skilled physical therapy services to continue to decrease pain, improve strength, and function.      PT Short Term Goals - 12/29/17 1903      PT SHORT TERM GOAL #1   Title  Pt will be independent with her HEP to promote ability to ambulate longer distances, drive more comfortably for her knee.     Time  3    Period  Weeks    Status  New    Target Date  01/21/18        PT Long Term Goals - 12/29/17 1904      PT LONG TERM GOAL #1   Title  Patient will report overall decreased R knee pain at worst  to promote ability to ambulate longer distances, drive more comfortably for her knee.     Baseline  Increased R knee pain with pressing the gas pedal to drive, as well as hiking about 3-4 miles; 7/10 for the past few months based on MD notes on 12/07/17 (12/29/2017)    Time  6    Period  Weeks    Status  New    Target Date  02/10/18      PT LONG TERM GOAL #2   Title  Patient will improve bilateral hip strength to promote femoral control and improve ability to perform standing tasks with less R knee pain.     Time  6    Period  Weeks    Status  New    Target Date  02/10/18      PT LONG TERM GOAL #3   Title  Pt will report being able to hike 4 miles with decreased overall R knee discomfort to promote fitness and  ability to perform liesure activities.     Baseline  Hiking 4 miles  aggravates R knee pain (12/29/2017)    Time  6    Period  Weeks    Status  New    Target Date  02/10/18            Plan - 01/26/18 1002    Clinical Impression Statement  Pt making very good progress with decreased R knee pain with walking longer distances based on subjective reports. Still demonstrates L hip discomfort and continued working on glute strengthening to help address. Demonstrates difficulty with L femoral control > R when performing standing exercises. Pt tolerated session well without aggravation of symptoms. Patient will benefit from continued skilled physical therapy services to continue to decrease pain, improve strength, and function.     Rehab Potential  Fair    Clinical Impairments Affecting Rehab Potential  (-) chronicity of condition, age, beginning stage of osteoporosis; (+) motivated    PT Frequency  2x / week    PT Duration  6 weeks    PT Treatment/Interventions  Neuromuscular re-education;Therapeutic activities;Therapeutic exercise;Patient/family education;Manual techniques;Dry needling;Aquatic Therapy;Electrical Stimulation;Iontophoresis 4mg /ml Dexamethasone;Gait training    PT Next Visit Plan  hip strengthening, femoral control, manual techniques, modalities PRN    Consulted and Agree with Plan of Care  Patient       Patient will benefit from skilled therapeutic intervention in order to improve the following deficits and impairments:  Pain, Postural dysfunction, Improper body mechanics, Difficulty walking, Decreased strength  Visit Diagnosis: Chronic pain of right knee  Muscle weakness (generalized)     Problem List Patient Active Problem List   Diagnosis Date Noted  . Osteoporosis 08/24/2017  . DNR (do not resuscitate) discussion 05/23/2015  . Diminished hearing 01/22/2015  . Low hemoglobin 10/15/2014  . Vitamin D deficiency 10/15/2014  . Hyperlipidemia 10/15/2014  . Retained orthopedic hardware 04/14/2013  . Triggering of digit 02/03/2013     Joneen Boers PT, DPT   01/26/2018, 10:05 AM  [facility] [address] [place], [address] Phone: [phone]   Fax:  [phone]  Name: Stacey Munoz MRN: [id number] Date of Birth: [birth year]

## 2018-02-01 ENCOUNTER — Ambulatory Visit: Payer: Medicare HMO

## 2018-02-01 DIAGNOSIS — M25561 Pain in right knee: Principal | ICD-10-CM

## 2018-02-01 DIAGNOSIS — G8929 Other chronic pain: Secondary | ICD-10-CM

## 2018-02-01 DIAGNOSIS — M6281 Muscle weakness (generalized): Secondary | ICD-10-CM

## 2018-02-01 NOTE — Patient Instructions (Signed)
Gave seated R knee flexion resisting the green band targeting the medial hamstrings as part of her HEP 10x3 daily. Pt demonstrated and verbalized understanding. Handout provided.

## 2018-02-01 NOTE — Therapy (Signed)
[facility] [address], [place], [address] Phone: [phone]   Fax:  [phone]  Physical Therapy Treatment  Patient Details  Name: Stacey Munoz MRN: [id number] Date of Birth: [birth year] Referring Provider (PT): Enid Derry, MD   Encounter Date: 02/01/2018  PT End of Session - 02/01/18 1305    Visit Number  6    Number of Visits  13    Date for PT Re-Evaluation  02/10/18    Authorization Type  6    Authorization Time Period  of 10 Medicare Progress Report    PT Start Time  1305    PT Stop Time  1352    PT Time Calculation (min)  47 min    Activity Tolerance  Patient tolerated treatment well    Behavior During Therapy  [facility] for tasks assessed/performed       Past Medical History:  Diagnosis Date  . Hyperlipidemia   . Low serum vitamin D     Past Surgical History:  Procedure Laterality Date  . TRIGGER FINGER RELEASE Right May 2016.  . TUBAL LIGATION    . WRIST FRACTURE SURGERY      There were no vitals filed for this visit.  Subjective Assessment - 02/01/18 1307    Subjective  Pt states that walking 2 miles is ok. However, walking 2.5 to 3 miles, pt feel pain.     Pertinent History  Chronic R knee pain. 2012, pt volunteered at [employer], pt underwent arthroscopic surgery for a torn meniscus.  Pt loves to hike in the mountains, walk and recently took up horse back riding.  Pt started having pain in her low back. Pt also got off a tall horse and jammed her R LE which caused R knee pain for months.  No fracture per imaging.  Pain is usually R lateral knee at proximal tibia.  Walking 2x/day for 1.5 miles each does not bother her R knee. Wore a huge brace from her 2012 surgery.  Used her brace again for her R knee to walk and move around.  Pain is not as bad as before but the pain is persistent. Would like to hike up to 5-6 miles and hike in [place].  Also had a bone density test which showed  the beginning of osteoporosis.   Pt also states that her L hip is higher than her R.   Pt also stopped horse back riding due to her low back bones degenerating and her R knee pain.   Pt injured her R knee this past spring (2019) when she landed on her R LE getting off her horse.  Denies clicking, popping or locking in R knee.     Patient Stated Goals  Be able to hike longer distances without serious pain. Be able to travel more comfortably for her R knee.     Currently in Pain?  Yes    Pain Score  3     Pain Onset  More than a month ago                               PT Education - 02/01/18 1343    Education Details  ther-ex, HEP    Person(s) Educated  Patient    Methods  Explanation;Demonstration;Tactile cues;Verbal cues;Handout    Comprehension  Returned demonstration;Verbalized understanding        Objective  MedbridgeAccess Code: [email or web address]  TTP R medial  Knee joint line at tibia, reproducing symptoms.    Manual therapy   Seated STM R lateral hamstrings to decrease tension  Decreased knee pain     Therapeutic exercise  Seated R knee flexion manually resisted targeting the medial hamstrings 10x  Then with green band 10x3  Reviewed and given as part her HEP  Prone glute max extension  R 10x5 seconds  with quad extension isometrics to eliminate hamstring cramp. 3 lbs ankle weight at thigh L 10x5 seconds for 2 sets. 3 lbs ankle weight at thigh   Static lunges onto Dyna disc with green band resisting hip abd/ER R 10x2 L 10x2   Improved exercise technique, movement at target joints, use of target muscles after min to mod verbal, visual, tactile cues.   Worked on medial hamstrings strengthening and decreasing lateral hamstrings tension to R tibia to help improve mechanics at knee joint when ambulating longer distances to help decrease medial knee pain. Continued working on glute strengthening  and femoral control to promote good knee mechanics as well. Pt tolerated session well without aggravation of symptoms. Pt will benefit from continued skilled physical therapy services to decrease pain and improve function.       PT Short Term Goals - 12/29/17 1903      PT SHORT TERM GOAL #1   Title  Pt will be independent with her HEP to promote ability to ambulate longer distances, drive more comfortably for her knee.     Time  3    Period  Weeks    Status  New    Target Date  01/21/18        PT Long Term Goals - 12/29/17 1904      PT LONG TERM GOAL #1   Title  Patient will report overall decreased R knee pain at worst  to promote ability to ambulate longer distances, drive more comfortably for her knee.     Baseline  Increased R knee pain with pressing the gas pedal to drive, as well as hiking about 3-4 miles; 7/10 for the past few months based on MD notes on 12/07/17 (12/29/2017)    Time  6    Period  Weeks    Status  New    Target Date  02/10/18      PT LONG TERM GOAL #2   Title  Patient will improve bilateral hip strength to promote femoral control and improve ability to perform standing tasks with less R knee pain.     Time  6    Period  Weeks    Status  New    Target Date  02/10/18      PT LONG TERM GOAL #3   Title  Pt will report being able to hike 4 miles with decreased overall R knee discomfort to promote fitness and ability to perform liesure activities.     Baseline  Hiking 4 miles aggravates R knee pain (12/29/2017)    Time  6    Period  Weeks    Status  New    Target Date  02/10/18            Plan - 02/01/18 1345    Clinical Impression Statement  Worked on medial hamstrings strengthening and decreasing lateral hamstrings tension to R tibia to help improve mechanics at knee joint when ambulating longer distances to help decrease medial knee pain. Continued working on glute strengthening and femoral control to promote good knee mechanics as well.  Pt  tolerated session well without aggravation of symptoms. Pt will benefit from continued skilled physical therapy services to decrease pain and improve function.     Rehab Potential  Fair    Clinical Impairments Affecting Rehab Potential  (-) chronicity of condition, age, beginning stage of osteoporosis; (+) motivated    PT Frequency  2x / week    PT Duration  6 weeks    PT Treatment/Interventions  Neuromuscular re-education;Therapeutic activities;Therapeutic exercise;Patient/family education;Manual techniques;Dry needling;Aquatic Therapy;Electrical Stimulation;Iontophoresis 4mg /ml Dexamethasone;Gait training    PT Next Visit Plan  hip strengthening, femoral control, manual techniques, modalities PRN    Consulted and Agree with Plan of Care  Patient       Patient will benefit from skilled therapeutic intervention in order to improve the following deficits and impairments:  Pain, Postural dysfunction, Improper body mechanics, Difficulty walking, Decreased strength  Visit Diagnosis: Chronic pain of right knee  Muscle weakness (generalized)     Problem List Patient Active Problem List   Diagnosis Date Noted  . Osteoporosis 08/24/2017  . DNR (do not resuscitate) discussion 05/23/2015  . Diminished hearing 01/22/2015  . Low hemoglobin 10/15/2014  . Vitamin D deficiency 10/15/2014  . Hyperlipidemia 10/15/2014  . Retained orthopedic hardware 04/14/2013  . Triggering of digit 02/03/2013    Joneen Boers PT, DPT   02/01/2018, 8:06 PM  [facility] [address] [place], [address] Phone: [phone]   Fax:  [phone]  Name: Stacey Munoz MRN: [id number] Date of Birth: Mar 21, 1946

## 2018-02-03 ENCOUNTER — Ambulatory Visit: Payer: Medicare HMO

## 2018-02-03 ENCOUNTER — Ambulatory Visit (INDEPENDENT_AMBULATORY_CARE_PROVIDER_SITE_OTHER): Payer: Medicare HMO

## 2018-02-03 DIAGNOSIS — Z23 Encounter for immunization: Secondary | ICD-10-CM

## 2018-02-03 DIAGNOSIS — G8929 Other chronic pain: Secondary | ICD-10-CM

## 2018-02-03 DIAGNOSIS — M25561 Pain in right knee: Secondary | ICD-10-CM | POA: Diagnosis not present

## 2018-02-03 DIAGNOSIS — M6281 Muscle weakness (generalized): Secondary | ICD-10-CM

## 2018-02-03 NOTE — Therapy (Signed)
[facility] [address] [place], [address] Phone: [phone]   Fax:  [phone]  Physical Therapy Treatment  Patient Details  Name: Stacey Munoz MRN: [id number] Date of Birth: [birth year] Referring Provider (PT): Enid Derry, MD   Encounter Date: 02/03/2018  PT End of Session - 02/03/18 1113    Visit Number  7    Number of Visits  13    Date for PT Re-Evaluation  02/10/18    Authorization Type  7    Authorization Time Period  of 10 Medicare Progress Report    PT Start Time  1115    PT Stop Time  1204    PT Time Calculation (min)  49 min    Activity Tolerance  Patient tolerated treatment well    Behavior During Therapy  [facility] for tasks assessed/performed       Past Medical History:  Diagnosis Date  . Hyperlipidemia   . Low serum vitamin D     Past Surgical History:  Procedure Laterality Date  . TRIGGER FINGER RELEASE Right May 2016.  . TUBAL LIGATION    . WRIST FRACTURE SURGERY      There were no vitals filed for this visit.  Subjective Assessment - 02/03/18 1116    Subjective  R knee is doing better. Though pt took a 2 mile hike last night and immediately put ice on it. Went to [employer] this morning. Has not felt any pain except for last weekend.  Insurance makes her pay $40 copay.    8/10 at most due to walking a lot such as a 3 mile hike. Other than that, no pain. The knee pain only lasted that night. However before, the pain lasted longer.  Improved L hip stability      Pertinent History  Chronic R knee pain. 2012, pt volunteered at [employer], pt underwent arthroscopic surgery for a torn meniscus.  Pt loves to hike in the mountains, walk and recently took up horse back riding.  Pt started having pain in her low back. Pt also got off a tall horse and jammed her R LE which caused R knee pain for months.  No fracture per imaging.  Pain is usually R lateral knee at proximal tibia.   Walking 2x/day for 1.5 miles each does not bother her R knee. Wore a huge brace from her 2012 surgery.  Used her brace again for her R knee to walk and move around.  Pain is not as bad as before but the pain is persistent. Would like to hike up to 5-6 miles and hike in [place].  Also had a bone density test which showed the beginning of osteoporosis.   Pt also states that her L hip is higher than her R.   Pt also stopped horse back riding due to her low back bones degenerating and her R knee pain.   Pt injured her R knee this past spring (2019) when she landed on her R LE getting off her horse.  Denies clicking, popping or locking in R knee.     Patient Stated Goals  Be able to hike longer distances without serious pain. Be able to travel more comfortably for her R knee.     Currently in Pain?  No/denies    Pain Score  0-No pain    Pain Onset  More than a month ago         [facility] PT Assessment -  02/03/18 1151      Strength   Right Hip Flexion  5/5    Right Hip Extension  4+/5    Right Hip ABduction  4+/5    Left Hip Flexion  5/5    Left Hip Extension  4+/5    Left Hip ABduction  4/5                           PT Education - 02/03/18 1804    Education Details  ther-ex    Person(s) Educated  Patient    Methods  Explanation;Demonstration;Tactile cues;Verbal cues    Comprehension  Returned demonstration;Verbalized understanding         Objective  MedbridgeAccess Code: [email or web address]   TTP R medial  Knee joint line at tibia, reproducing symptoms.   8/10 at most due to walking a lot such as a 3 mile hike. Other than that, no pain. The knee pain only lasted that night. However before, the pain lasted longer.  Improved L hip stability      Manual therapy   Seated STM R lateral hamstrings to decrease tension   Seated STM R vastus lateralis    Therapeutic exercise  S/L hip abduction 10x2 each LE  Reviewed and given as part of her HEP. Pt  demonstrated and verbalized understanding.   Seated R knee flexion with green band 10x3              Reviewed POC due to insurance  1 follow up in November 2019.   Manually resisted seated hip extension, S/L hip abduction, seated hip flexion 1-2x  Reviewed progress/current status with strength with pt.   Standing mini squats on dyna disc without UE assist and blue band resisting hip abduction/ER 10x2    Improved exercise technique, movement at target joints, use of target muscles after min to mod verbal, visual, tactile cues.    Pt demonstrates overall improved bilateral hip strength, femoral control and overall decreased R knee pain since initial evaluation. Pt demonstrates 8/10 R knee pain at worst after walking about 8 miles but pt states that the duration of her pain did not last as long as it did prior to starting PT. Pt to follow up in about 1 month (first week of November 2019) while patient performs her exercises at home due to insurance.      PT Short Term Goals - 02/03/18 1822      PT SHORT TERM GOAL #1   Title  Pt will be independent with her HEP to promote ability to ambulate longer distances, drive more comfortably for her knee.     Time  3    Period  Weeks    Status  Achieved    Target Date  01/21/18        PT Long Term Goals - 02/03/18 1816      PT LONG TERM GOAL #1   Title  Patient will report overall decreased R knee pain at worst  to promote ability to ambulate longer distances, drive more comfortably for her knee.     Baseline  Increased R knee pain with pressing the gas pedal to drive, as well as hiking about 3-4 miles; 7/10 for the past few months based on MD notes on 12/07/17 (12/29/2017); 8/10 at most for the past 7 days but pain does not last as long compared to before PT (02/03/2018)     Time  6    Period  Weeks    Status  On-going    Target Date  02/10/18      PT LONG TERM GOAL #2   Title  Patient will improve bilateral hip strength to promote  femoral control and improve ability to perform standing tasks with less R knee pain.     Time  6    Period  Weeks    Status  Partially Met    Target Date  02/10/18      PT LONG TERM GOAL #3   Title  Pt will report being able to hike 4 miles with decreased overall R knee discomfort to promote fitness and ability to perform liesure activities.     Baseline  Hiking 4 miles aggravates R knee pain (12/29/2017); pt has not yet been able to hike 4 miles (02/03/2018)    Time  6    Period  Weeks    Status  On-going    Target Date  02/10/18            Plan - 02/03/18 1112    Clinical Impression Statement  Pt demonstrates overall improved bilateral hip strength, femoral control and overall decreased R knee pain since initial evaluation. Pt demonstrates 8/10 R knee pain at worst after walking about 8 miles but pt states that the duration of her pain did not last as long as it did prior to starting PT. Pt to follow up in about 1 month (first week of November 2019) while patient performs her exercises at home due to insurance.      Rehab Potential  Fair    Clinical Impairments Affecting Rehab Potential  (-) chronicity of condition, age, beginning stage of osteoporosis; (+) motivated    PT Frequency  2x / week    PT Duration  6 weeks    PT Treatment/Interventions  Neuromuscular re-education;Therapeutic activities;Therapeutic exercise;Patient/family education;Manual techniques;Dry needling;Aquatic Therapy;Electrical Stimulation;Iontophoresis 69m/ml Dexamethasone;Gait training    PT Next Visit Plan  hip strengthening, femoral control, manual techniques, modalities PRN    Consulted and Agree with Plan of Care  Patient       Patient will benefit from skilled therapeutic intervention in order to improve the following deficits and impairments:  Pain, Postural dysfunction, Improper body mechanics, Difficulty walking, Decreased strength  Visit Diagnosis: Chronic pain of right knee  Muscle weakness  (generalized)     Problem List Patient Active Problem List   Diagnosis Date Noted  . Osteoporosis 08/24/2017  . DNR (do not resuscitate) discussion 05/23/2015  . Diminished hearing 01/22/2015  . Low hemoglobin 10/15/2014  . Vitamin D deficiency 10/15/2014  . Hyperlipidemia 10/15/2014  . Retained orthopedic hardware 04/14/2013  . Triggering of digit 02/03/2013   MJoneen BoersPT, DPT   02/03/2018, 6:24 PM  [facility] [address] [place] [address]Phone: [phone]  Fax:  [phone] Name: GJacqueline SpoffordMRN: [id number] of Birth: 112-11-1945

## 2018-02-03 NOTE — Patient Instructions (Addendum)
MedbridgeAccess Code: [email or web address]  Sidelying Hip Abduction  10x each LE daily   Pt was recommended to massage her R vastus lateralis and lateral hamstrings before and after walking 3-4 miles. Pt demonstrated and verbalized understanding.

## 2018-02-08 ENCOUNTER — Ambulatory Visit: Payer: Medicare HMO

## 2018-02-10 ENCOUNTER — Ambulatory Visit: Payer: Medicare HMO

## 2018-03-01 ENCOUNTER — Ambulatory Visit: Payer: Medicare HMO | Attending: Family Medicine

## 2018-03-01 DIAGNOSIS — M25561 Pain in right knee: Secondary | ICD-10-CM | POA: Diagnosis present

## 2018-03-01 DIAGNOSIS — M6281 Muscle weakness (generalized): Secondary | ICD-10-CM | POA: Insufficient documentation

## 2018-03-01 DIAGNOSIS — G8929 Other chronic pain: Secondary | ICD-10-CM | POA: Diagnosis present

## 2018-03-01 NOTE — Therapy (Signed)
[facility] [address] [place], [address] Phone: [phone]   Fax:  [phone]  Physical Therapy Treatment And Progress Report (12/29/2017 to 03/01/2018)  Patient Details  Name: Stacey Munoz MRN: [id number] Date of Birth: 1946/03/16 Referring Provider (PT): Enid Derry, MD    Encounter Date: 03/01/2018  PT End of Session - 03/01/18 0947    Visit Number  8    Number of Visits  13    Date for PT Re-Evaluation  04/14/18    Authorization Type  8    Authorization Time Period  of 10 Medicare Progress Report    PT Start Time  0950    PT Stop Time  1047    PT Time Calculation (min)  57 min    Activity Tolerance  Patient tolerated treatment well    Behavior During Therapy  [facility] for tasks assessed/performed       Past Medical History:  Diagnosis Date  . Hyperlipidemia   . Low serum vitamin D     Past Surgical History:  Procedure Laterality Date  . TRIGGER FINGER RELEASE Right May 2016.  . TUBAL LIGATION    . WRIST FRACTURE SURGERY      There were no vitals filed for this visit.  Subjective Assessment - 03/01/18 0950    Subjective  Any time she walks up and down stairs, carry heavy suitcases, hike up and down, carry 2 grocery bags causes R knee pain in 2 hours. Can walk 2-3 miles with hardly any problem.      Pertinent History  Chronic R knee pain. 2012, pt volunteered at [employer], pt underwent arthroscopic surgery for a torn meniscus.  Pt loves to hike in the mountains, walk and recently took up horse back riding.  Pt started having pain in her low back. Pt also got off a tall horse and jammed her R LE which caused R knee pain for months.  No fracture per imaging.  Pain is usually R lateral knee at proximal tibia.  Walking 2x/day for 1.5 miles each does not bother her R knee. Wore a huge brace from her 2012 surgery.  Used her brace again for her R knee to walk and move around.  Pain is not as  bad as before but the pain is persistent. Would like to hike up to 5-6 miles and hike in [place].  Also had a bone density test which showed the beginning of osteoporosis.   Pt also states that her L hip is higher than her R.   Pt also stopped horse back riding due to her low back bones degenerating and her R knee pain.   Pt injured her R knee this past spring (2019) when she landed on her R LE getting off her horse.  Denies clicking, popping or locking in R knee.     Patient Stated Goals  Be able to hike longer distances without serious pain. Be able to travel more comfortably for her R knee.     Currently in Pain?  No/denies    Pain Onset  More than a month ago                               PT Education - 03/01/18 1108    Education Details  ther-ex, HEP, plan of care    Person(s) Educated  Patient    Methods  Explanation;Demonstration;Tactile cues;Verbal cues  Comprehension  Returned demonstration;Verbalized understanding      Objective     MedbridgeAccess Code: [email or web address]    Manual therapy   R vastus lateralis tension, decreased medial patellar glide R knee  Medial patellar taping to R knee  No R knee pain with ascending and descending 4 regular steps multiple times while holding 15 lbs on each hand (30 lbs total)  Taught pt how to perform self medial patellar taping. Pt demonstrated and verbalized understanding.     Therapeutic exercise  Ascending and desecnding 4 regular steps without UE assist without and with 15 lbs each hand multiple times  R knee discomfort when stepping down onto the next step with R LE   Then after medial patellar taping  No R knee discomfort  Recommended patient to massage R vastus lateralis prior to hiking to help decrease knee pain and promote medial patellar glide.   seated hip adduction ball and glute max squeeze 10x5 seconds  Reviewed and given as part of her HEP. Pt demonstrated and verbalized  understanding.   Reviewed plan of care: another follow up appoitnment in a month to check on progress/current status  Time also taken during session to answer patient questions.    Improved exercise technique, movement at target joints, use of target muscles after min to mod verbal, visual, tactile cues.    Pt currently able to ambulate up to 3 miles on level surface with minimal to no knee pain twice based on subjective reports. Pain level at worst also decreased from 8/10 from last measurement to 7/10 with today's measurement. Reproduced R knee discomfort when stepping down with her R LE onto the next step today which disappeared after medial patellar taping to promote medial glide of R patella. Pt was educated on how to tape her patella medially as well as recommended for pt to massage her R vastus lateralis prior to her hikes to help with her knee pain. Pt demonstrated and verbalized understanding. Pt to continue performing her exercises at home for the next few weeks and schedule another follow up appointment in about 1 month. Pt will benefit from continued skilled physical therapy services to help decrease R knee pain, improve function, and ability to perform leisure/fitness activities such as hiking.          PT Short Term Goals - 02/03/18 1822      PT SHORT TERM GOAL #1   Title  Pt will be independent with her HEP to promote ability to ambulate longer distances, drive more comfortably for her knee.     Time  3    Period  Weeks    Status  Achieved    Target Date  01/21/18        PT Long Term Goals - 03/01/18 1041      PT LONG TERM GOAL #1   Title  Patient will report overall decreased R knee pain at worst  to promote ability to ambulate longer distances, drive more comfortably for her knee.     Baseline  Increased R knee pain with pressing the gas pedal to drive, as well as hiking about 3-4 miles; 7/10 for the past few months based on MD notes on 12/07/17 (12/29/2017); 8/10  at most for the past 7 days but pain does not last as long compared to before PT (02/03/2018) ; Pt states R knee pain decreased overall (03/01/2018); 7/10 at most for the past 7 days (when pt climbs up and down steps while carrying weight,  or hiking; better than it used to be per pt; other than that, about 1/10) 03/01/2018.      Time  6    Period  Weeks    Status  On-going    Target Date  04/14/18      PT LONG TERM GOAL #2   Title  Patient will improve bilateral hip strength to promote femoral control and improve ability to perform standing tasks with less R knee pain.     Time  6    Period  Weeks    Status  Partially Met    Target Date  04/14/18      PT LONG TERM GOAL #3   Title  Pt will report being able to hike 4 miles with decreased overall R knee discomfort to promote fitness and ability to perform liesure activities.     Baseline  Hiking 4 miles aggravates R knee pain (12/29/2017); pt has not yet been able to hike 4 miles (02/03/2018); hiking increases R knee pain. Pt however now able to walk 2-3 miles on flat surface with minimal to no R knee pain based on subjective reports (03/01/2018)     Time  6    Period  Weeks    Status  On-going    Target Date  04/14/18            Plan - 03/01/18 0949    Clinical Impression Statement  Pt currently able to ambulate up to 3 miles on level surface with minimal to no knee pain twice based on subjective reports. Pain level at worst also decreased from 8/10 from last measurement to 7/10 with today's measurement. Reproduced R knee discomfort when stepping down with her R LE onto the next step today which disappeared after medial patellar taping to promote medial glide of R patella. Pt was educated on how to tape her patella medially as well as recommended for pt to massage her R vastus lateralis prior to her hikes to help with her knee pain. Pt demonstrated and verbalized understanding. Pt to continue performing her exercises at home for the next few  weeks and schedule another follow up appointment in about 1 month. Pt will benefit from continued skilled physical therapy services to help decrease R knee pain, improve function, and ability to perform leisure/fitness activities such as hiking.     History and Personal Factors relevant to plan of care:  Chronicity of R knee pain, beginning stage of osteoporosis    Clinical Presentation  Stable    Clinical Presentation due to:  PT making some progress with PT toward goals.     Clinical Decision Making  Low    Rehab Potential  Fair    Clinical Impairments Affecting Rehab Potential  (-) chronicity of condition, age, beginning stage of osteoporosis; (+) motivated    PT Frequency  --   1 follow up visit in about a month   PT Duration  Other (comment)   1 follow up visit in about a month   PT Treatment/Interventions  Neuromuscular re-education;Therapeutic activities;Therapeutic exercise;Patient/family education;Manual techniques;Dry needling;Aquatic Therapy;Electrical Stimulation;Iontophoresis '4mg'$ /ml Dexamethasone;Gait training    PT Next Visit Plan  hip strengthening, femoral control, manual techniques, modalities PRN    Consulted and Agree with Plan of Care  Patient       Patient will benefit from skilled therapeutic intervention in order to improve the following deficits and impairments:  Pain, Postural dysfunction, Improper body mechanics, Difficulty walking, Decreased strength  Visit Diagnosis: Chronic pain of  right knee - Plan: PT plan of care cert/re-cert  Muscle weakness (generalized) - Plan: PT plan of care cert/re-cert     Problem List Patient Active Problem List   Diagnosis Date Noted  . Osteoporosis 08/24/2017  . DNR (do not resuscitate) discussion 05/23/2015  . Diminished hearing 01/22/2015  . Low hemoglobin 10/15/2014  . Vitamin D deficiency 10/15/2014  . Hyperlipidemia 10/15/2014  . Retained orthopedic hardware 04/14/2013  . Triggering of digit 02/03/2013   Thank you  for your referral.  Joneen Boers PT, DPT   03/01/2018, 12:52 PM  [facility] [address] [place], [address] Phone: [phone]   Fax:  [phone]  Name: Anyla Israelson MRN: [id number] Date of Birth: 1945/06/27

## 2018-05-19 ENCOUNTER — Ambulatory Visit (INDEPENDENT_AMBULATORY_CARE_PROVIDER_SITE_OTHER): Payer: Medicare HMO

## 2018-05-19 VITALS — BP 108/68 | HR 78 | Temp 98.0°F | Resp 16 | Ht 65.0 in | Wt 134.3 lb

## 2018-05-19 DIAGNOSIS — Z0000 Encounter for general adult medical examination without abnormal findings: Secondary | ICD-10-CM | POA: Diagnosis not present

## 2018-05-19 DIAGNOSIS — Z1231 Encounter for screening mammogram for malignant neoplasm of breast: Secondary | ICD-10-CM | POA: Diagnosis not present

## 2018-05-19 NOTE — Patient Instructions (Signed)
Stacey Munoz , Thank you for taking time to come for your Medicare Wellness Visit. I appreciate your ongoing commitment to your health goals. Please review the following plan we discussed and let me know if I can assist you in the future.   Screening recommendations/referrals: Colonoscopy: done 01/26/14 repeat in 2025 Mammogram: done 05/18/16. Please call [phone] to schedule your mammogram.  Bone Density: done 08/24/17. Repeat in 2021. Recommended yearly ophthalmology/optometry visit for glaucoma screening and checkup Recommended yearly dental visit for hygiene and checkup  Vaccinations: Influenza vaccine: done 02/03/18. Pneumococcal vaccine: done 05/11/16 Tdap vaccine: done 06/03/11 Shingles vaccine: Shingrix discussed. Please contact your pharmacy for coverage information.     Advanced directives: Please bring a copy of your health care power of attorney and living will to the office at your convenience.  Conditions/risks identified: Keep up the great work!  Next appointment: Please follow up in one year for your Medicare Annual Wellness visit.     Preventive Care 63 Years and Older, Female Preventive care refers to lifestyle choices and visits with your health care provider that can promote health and wellness. What does preventive care include?  A yearly physical exam. This is also called an annual well check.  Dental exams once or twice a year.  Routine eye exams. Ask your health care provider how often you should have your eyes checked.  Personal lifestyle choices, including:  Daily care of your teeth and gums.  Regular physical activity.  Eating a healthy diet.  Avoiding tobacco and drug use.  Limiting alcohol use.  Practicing safe sex.  Taking low-dose aspirin every day.  Taking vitamin and mineral supplements as recommended by your health care provider. What happens during an annual well check? The services and screenings done by your health care provider  during your annual well check will depend on your age, overall health, lifestyle risk factors, and family history of disease. Counseling  Your health care provider may ask you questions about your:  Alcohol use.  Tobacco use.  Drug use.  Emotional well-being.  Home and relationship well-being.  Sexual activity.  Eating habits.  History of falls.  Memory and ability to understand (cognition).  Work and work Statistician.  Reproductive health. Screening  You may have the following tests or measurements:  Height, weight, and BMI.  Blood pressure.  Lipid and cholesterol levels. These may be checked every 5 years, or more frequently if you are over 12 years old.  Skin check.  Lung cancer screening. You may have this screening every year starting at age 45 if you have a 30-pack-year history of smoking and currently smoke or have quit within the past 15 years.  Fecal occult blood test (FOBT) of the stool. You may have this test every year starting at age 39.  Flexible sigmoidoscopy or colonoscopy. You may have a sigmoidoscopy every 5 years or a colonoscopy every 10 years starting at age 95.  Hepatitis C blood test.  Hepatitis B blood test.  Sexually transmitted disease (STD) testing.  Diabetes screening. This is done by checking your blood sugar (glucose) after you have not eaten for a while (fasting). You may have this done every 1-3 years.  Bone density scan. This is done to screen for osteoporosis. You may have this done starting at age 90.  Mammogram. This may be done every 1-2 years. Talk to your health care provider about how often you should have regular mammograms. Talk with your health care provider about your test results,  treatment options, and if necessary, the need for more tests. Vaccines  Your health care provider may recommend certain vaccines, such as:  Influenza vaccine. This is recommended every year.  Tetanus, diphtheria, and acellular pertussis  (Tdap, Td) vaccine. You may need a Td booster every 10 years.  Zoster vaccine. You may need this after age 18.  Pneumococcal 13-valent conjugate (PCV13) vaccine. One dose is recommended after age 15.  Pneumococcal polysaccharide (PPSV23) vaccine. One dose is recommended after age 58. Talk to your health care provider about which screenings and vaccines you need and how often you need them. This information is not intended to replace advice given to you by your health care provider. Make sure you discuss any questions you have with your health care provider. Document Released: 05/10/2015 Document Revised: 01/01/2016 Document Reviewed: 02/12/2015 Elsevier Interactive Patient Education  2017 [place] Prevention in the Home Falls can cause injuries. They can happen to people of all ages. There are many things you can do to make your home safe and to help prevent falls. What can I do on the outside of my home?  Regularly fix the edges of walkways and driveways and fix any cracks.  Remove anything that might make you trip as you walk through a door, such as a raised step or threshold.  Trim any bushes or trees on the path to your home.  Use bright outdoor lighting.  Clear any walking paths of anything that might make someone trip, such as rocks or tools.  Regularly check to see if handrails are loose or broken. Make sure that both sides of any steps have handrails.  Any raised decks and porches should have guardrails on the edges.  Have any leaves, snow, or ice cleared regularly.  Use sand or salt on walking paths during winter.  Clean up any spills in your garage right away. This includes oil or grease spills. What can I do in the bathroom?  Use night lights.  Install grab bars by the toilet and in the tub and shower. Do not use towel bars as grab bars.  Use non-skid mats or decals in the tub or shower.  If you need to sit down in the shower, use a plastic, non-slip  stool.  Keep the floor dry. Clean up any water that spills on the floor as soon as it happens.  Remove soap buildup in the tub or shower regularly.  Attach bath mats securely with double-sided non-slip rug tape.  Do not have throw rugs and other things on the floor that can make you trip. What can I do in the bedroom?  Use night lights.  Make sure that you have a light by your bed that is easy to reach.  Do not use any sheets or blankets that are too big for your bed. They should not hang down onto the floor.  Have a firm chair that has side arms. You can use this for support while you get dressed.  Do not have throw rugs and other things on the floor that can make you trip. What can I do in the kitchen?  Clean up any spills right away.  Avoid walking on wet floors.  Keep items that you use a lot in easy-to-reach places.  If you need to reach something above you, use a strong step stool that has a grab bar.  Keep electrical cords out of the way.  Do not use floor polish or wax that makes floors  slippery. If you must use wax, use non-skid floor wax.  Do not have throw rugs and other things on the floor that can make you trip. What can I do with my stairs?  Do not leave any items on the stairs.  Make sure that there are handrails on both sides of the stairs and use them. Fix handrails that are broken or loose. Make sure that handrails are as long as the stairways.  Check any carpeting to make sure that it is firmly attached to the stairs. Fix any carpet that is loose or worn.  Avoid having throw rugs at the top or bottom of the stairs. If you do have throw rugs, attach them to the floor with carpet tape.  Make sure that you have a light switch at the top of the stairs and the bottom of the stairs. If you do not have them, ask someone to add them for you. What else can I do to help prevent falls?  Wear shoes that:  Do not have high heels.  Have rubber bottoms.  Are  comfortable and fit you well.  Are closed at the toe. Do not wear sandals.  If you use a stepladder:  Make sure that it is fully opened. Do not climb a closed stepladder.  Make sure that both sides of the stepladder are locked into place.  Ask someone to hold it for you, if possible.  Clearly mark and make sure that you can see:  Any grab bars or handrails.  First and last steps.  Where the edge of each step is.  Use tools that help you move around (mobility aids) if they are needed. These include:  Canes.  Walkers.  Scooters.  Crutches.  Turn on the lights when you go into a dark area. Replace any light bulbs as soon as they burn out.  Set up your furniture so you have a clear path. Avoid moving your furniture around.  If any of your floors are uneven, fix them.  If there are any pets around you, be aware of where they are.  Review your medicines with your doctor. Some medicines can make you feel dizzy. This can increase your chance of falling. Ask your doctor what other things that you can do to help prevent falls. This information is not intended to replace advice given to you by your health care provider. Make sure you discuss any questions you have with your health care provider. Document Released: 02/07/2009 Document Revised: 09/19/2015 Document Reviewed: 05/18/2014 Elsevier Interactive Patient Education  2017 [employer].

## 2018-05-19 NOTE — Progress Notes (Signed)
Subjective:   Tal Neer is a 73 y.o. female who presents for Medicare Annual (Subsequent) preventive examination.  Review of Systems:   Cardiac Risk Factors include: advanced age (>74mn, >>61women)     Objective:     Vitals: BP 108/68 (BP Location: Left Arm, Patient Position: Sitting, Cuff Size: Normal)   Pulse 78   Temp 98 F (36.7 C) (Oral)   Resp 16   Ht 5' 5" (1.651 m)   Wt 134 lb 4.8 oz (60.9 kg)   SpO2 98%   BMI 22.35 kg/m   Body mass index is 22.35 kg/m.  Advanced Directives 05/19/2018 05/18/2017 11/05/2016 05/11/2016 05/11/2016 11/06/2015 05/09/2015  Does Patient Have a Medical Advance Directive? Yes Yes No Yes No No Yes  Type of AParamedic[place] - Living will;Healthcare Power of [place]  Does patient want to make changes to medical advance directive? - - - - - - -  Copy of HEchoin Chart? No - copy requested No - copy requested - Yes - - Yes  Would patient like information on creating a medical advance directive? - - - - - No - patient declined information -    Tobacco Social History   Tobacco Use  Smoking Status Never Smoker  Smokeless Tobacco Never Used  Tobacco Comment   smoking cessation materials not required     Counseling given: Not Answered Comment: smoking cessation materials not required   Clinical Intake:  Pre-visit preparation completed: Yes  Pain : No/denies pain     Nutritional Status: BMI of 19-24  Normal Nutritional Risks: None Diabetes: No  How often do you need to have someone help you when you read instructions, pamphlets, or other written materials from your doctor or pharmacy?: 1 - Never What is the last grade level you completed in school?: doctorate degree  Interpreter Needed?: No  Information entered by :: KClemetine MarkerLPN  Past Medical History:  Diagnosis Date  . Hyperlipidemia   . Low serum  vitamin D   . Osteoporosis    Past Surgical History:  Procedure Laterality Date  . KNEE ARTHROSCOPY Right 01/2011   torn meniscus  . TRIGGER FINGER RELEASE Right May 2016.  . TUBAL LIGATION    . WRIST FRACTURE SURGERY     Family History  Problem Relation Age of Onset  . Breast cancer Mother 622 . Diabetes Father   . Cancer Father   . Heart disease Brother   . Clotting disorder Brother   . Breast cancer Paternal Grandmother        PT think it was cancer; never got check   . Kidney disease Maternal Grandmother   . Cancer Maternal Grandfather   . Gout Son   . Heart disease Son    Social History   Socioeconomic History  . Marital status: Single    Spouse name: Not on file  . Number of children: 3  . Years of education: Not on file  . Highest education level: Doctorate  Occupational History    Employer: RETIRED  Social Needs  . Financial resource strain: Not hard at all  . Food insecurity:    Worry: Never true    Inability: Never true  . Transportation needs:    Medical: No    Non-medical: No  Tobacco Use  . Smoking status: Never Smoker  . Smokeless tobacco: Never Used  . Tobacco comment:  smoking cessation materials not required  Substance and Sexual Activity  . Alcohol use: Yes    Alcohol/week: 1.0 standard drinks    Types: 1 Glasses of wine per week    Frequency: Never    Comment: rare  . Drug use: No  . Sexual activity: Not Currently  Lifestyle  . Physical activity:    Days per week: 6 days    Minutes per session: 120 min  . Stress: Not at all  Relationships  . Social connections:    Talks on phone: More than three times a week    Gets together: More than three times a week    Attends religious service: More than 4 times per year    Active member of club or organization: Yes    Attends meetings of clubs or organizations: More than 4 times per year    Relationship status: Divorced  Other Topics Concern  . Not on file  Social History Narrative  . Not  on file    Outpatient Encounter Medications as of 05/19/2018  Medication Sig  . calcium-vitamin D (SM CALCIUM 500/VITAMIN D3) 500-400 MG-UNIT per tablet Take by mouth.  . Coenzyme Q10 100 MG capsule Take by mouth.  Randell Loop Prim-Borage (FLAX OIL XTRA) CAPS Take by mouth.  . Multiple Vitamin (MULTI-VITAMINS) TABS Take by mouth.  . Turmeric 500 MG CAPS Take 2 capsules by mouth daily.   No facility-administered encounter medications on file as of 05/19/2018.     Activities of Daily Living In your present state of health, do you have any difficulty performing the following activities: 05/19/2018 12/07/2017  Hearing? N Y  Comment wears hearing aids -  Vision? N N  Comment wears glasses -  Difficulty concentrating or making decisions? N N  Comment - -  Walking or climbing stairs? N N  Dressing or bathing? N N  Doing errands, shopping? N N  Preparing Food and eating ? N -  Using the Toilet? N -  In the past six months, have you accidently leaked urine? N -  Do you have problems with loss of bowel control? N -  Managing your Medications? N -  Managing your Finances? N -  Housekeeping or managing your Housekeeping? N -  Some recent data might be hidden    Patient Care Team: Lada, Satira Anis, MD as PCP - General (Family Medicine) Birder Robson, MD as Referring Physician (Ophthalmology)    Assessment:   This is a routine wellness examination for Khaniya.  Exercise Activities and Dietary recommendations Current Exercise Habits: Structured exercise class, Type of exercise: yoga;walking;Other - see comments;stretching(silver sneakers class and hiking), Time (Minutes): 60, Frequency (Times/Week): 6, Weekly Exercise (Minutes/Week): 360, Intensity: Moderate, Exercise limited by: None identified  Goals    . DIET - INCREASE WATER INTAKE     Recommend to drink at least 6-8 8oz glasses of water per day.     . Patient Stated     Pt would like to travel more    . Weight (lb) < 200 lb  (90.7 kg)     Starting 05/11/16, I will work to loss 10 lbs.       Fall Risk Fall Risk  05/19/2018 12/07/2017 05/31/2017 05/18/2017 11/05/2016  Falls in the past year? 0 No No No No  Number falls in past yr: 0 - - - -  Follow up Falls prevention discussed - - - -   FALL RISK PREVENTION PERTAINING TO THE HOME:  Any stairs in  or around the home WITH handrails? No  Home free of loose throw rugs in walkways, pet beds, electrical cords, etc? Yes  Adequate lighting in your home to reduce risk of falls? Yes   ASSISTIVE DEVICES UTILIZED TO PREVENT FALLS:  Life alert? No  Use of a cane, walker or w/c? No  Grab bars in the bathroom? Yes  Shower chair or bench in shower? Yes Elevated toilet seat or a handicapped toilet? Yes   DME ORDERS:  DME order needed?  No   TIMED UP AND GO:  Was the test performed? Yes .  Length of time to ambulate 10 feet: 5 sec.   GAIT:  Appearance of gait: Gait stead-fast and without the use of an assistive device.  Education: Fall risk prevention has been discussed.  Intervention(s) required? No   Depression Screen PHQ 2/9 Scores 05/19/2018 12/07/2017 05/31/2017 05/18/2017  PHQ - 2 Score 0 0 0 0     Cognitive Function     6CIT Screen 05/19/2018 05/11/2016  What Year? 0 points 0 points  What month? 0 points 0 points  What time? 0 points 0 points  Count back from 20 0 points 0 points  Months in reverse 0 points 0 points  Repeat phrase 0 points 0 points  Total Score 0 0    Immunization History  Administered Date(s) Administered  . Encephalitis 05/14/2009, 05/20/2009, 06/10/2009  . Hepatitis A 05/10/2009, 10/23/2009  . Hepatitis A, Adult 11/05/2016, 05/31/2017  . Hepatitis B 05/10/2009, 06/17/2009, 10/23/2009  . IPV 09/09/2007  . Influenza, High Dose Seasonal PF 04/03/2016, 02/08/2017, 02/03/2018  . Influenza-Unspecified 06/03/2009, 07/22/2010, 01/25/2014  . MMR 09/09/2007  . Pneumococcal Conjugate-13 05/11/2016  . Pneumococcal Polysaccharide-23  01/16/2013  . Rabies, IM 05/08/2009, 05/14/2009, 05/29/2009  . Td 06/03/2011  . Tdap 06/02/2009  . Typhoid Parenteral, AKD ([place] [employer]) 05/20/2009    Qualifies for Shingles Vaccine? Yes . Due for Shingrix. Education has been provided regarding the importance of this vaccine. Pt has been advised to call insurance company to determine out of pocket expense. Advised may also receive vaccine at local pharmacy or Health Dept. Verbalized acceptance and understanding.  Tdap: Up to date  Flu Vaccine: Up to date  Pneumococcal Vaccine: Up to date   Screening Tests Health Maintenance  Topic Date Due  . MAMMOGRAM  05/18/2017  . TETANUS/TDAP  06/02/2021  . COLONOSCOPY  01/27/2024  . INFLUENZA VACCINE  Completed  . DEXA SCAN  Completed  . Hepatitis C Screening  Completed  . PNA vac Low Risk Adult  Completed   Cancer Screenings:  Colorectal Screening: Completed 01/26/14. Repeat every 10 years.  Mammogram: Completed 05/18/16. Repeat every year;  Ordered today. Pt provided with contact information and advised to call to schedule appt.   Bone Density: Completed 08/24/17. Results reflect OSTEOPOROSIS. Repeat every 2 years.   Lung Cancer Screening: (Low Dose CT Chest recommended if Age 32-80 years, 30 pack-year currently smoking OR have quit w/in 15years.) does not qualify.    Additional Screening:  Hepatitis C Screening: does qualify; Completed 05/11/16  Vision Screening: Recommended annual ophthalmology exams for early detection of glaucoma and other disorders of the eye. Is the patient up to date with their annual eye exam?  Yes  Who is the provider or what is the name of the office in which the pt attends annual eye exams? Dr. Michelene Heady   Dental Screening: Recommended annual dental exams for proper oral hygiene  Community Resource Referral:  CRR required this visit?  No      Plan:     I have personally reviewed and addressed the Medicare Annual Wellness questionnaire and have  noted the following in the patient's chart:  A. Medical and social history B. Use of alcohol, tobacco or illicit drugs  C. Current medications and supplements D. Functional ability and status E.  Nutritional status F.  Physical activity G. Advance directives H. List of other physicians I.  Hospitalizations, surgeries, and ER visits in previous 12 months J.  [place] such as hearing and vision if needed, cognitive and depression L. Referrals and appointments   In addition, I have reviewed and discussed with patient certain preventive protocols, quality metrics, and best practice recommendations. A written personalized care plan for preventive services as well as general preventive health recommendations were provided to patient.   Signed,  Clemetine Marker, LPN Nurse Health Advisor   Nurse Notes: Pt doing well overall and appreciative of visit today.

## 2018-10-12 ENCOUNTER — Encounter: Payer: Self-pay | Admitting: Nurse Practitioner

## 2018-10-12 ENCOUNTER — Ambulatory Visit (INDEPENDENT_AMBULATORY_CARE_PROVIDER_SITE_OTHER): Payer: Medicare HMO | Admitting: Nurse Practitioner

## 2018-10-12 ENCOUNTER — Other Ambulatory Visit: Payer: Self-pay

## 2018-10-12 DIAGNOSIS — R591 Generalized enlarged lymph nodes: Secondary | ICD-10-CM | POA: Diagnosis not present

## 2018-10-12 DIAGNOSIS — J029 Acute pharyngitis, unspecified: Secondary | ICD-10-CM | POA: Diagnosis not present

## 2018-10-12 MED ORDER — AMOXICILLIN-POT CLAVULANATE 875-125 MG PO TABS: 1.0000 | ORAL_TABLET | Freq: Two times a day (BID) | ORAL | 0 refills | Status: DC

## 2018-10-12 NOTE — Progress Notes (Signed)
Virtual Visit via Video Note  I connected with Stacey Munoz on 10/12/18 at 10:20 AM EDT by a video enabled telemedicine application and verified that I am speaking with the correct person using two identifiers.   Staff discussed the limitations of evaluation and management by telemedicine and the availability of in person appointments. The patient expressed understanding and agreed to proceed.  Patient location: home  My location: work office Other people present:  none HPI  Patient endorses sore throat x 2 months. Patient states that it feels like there is swelling and soreness up behind the throat and nasal congestion at night. Uses a neti pot to help with congestion in the morning. States soreness usually starts middle of the morning and last till the evening. Patient has been walking about 3 miles a day. She states when it was really bad about 3-4 weeks ago she tried theraflu tried it for 3 days with some relief. No dysphagia, trouble breathing, hoarseness, voice change.  Endorses some tenderness on left side of neck. No fevers or chills. No considerable smoking history.    PHQ2/9: Depression screen [facility] 2/9 10/12/2018 05/19/2018 12/07/2017 05/31/2017 05/18/2017  Decreased Interest 0 0 0 0 0  Down, Depressed, Hopeless 0 0 0 0 0  PHQ - 2 Score 0 0 0 0 0  Altered sleeping 0 - - - -  Tired, decreased energy 0 - - - -  Change in appetite 0 - - - -  Feeling bad or failure about yourself  0 - - - -  Trouble concentrating 0 - - - -  Moving slowly or fidgety/restless 0 - - - -  Suicidal thoughts 0 - - - -  PHQ-9 Score 0 - - - -  Difficult doing work/chores Not difficult at all - - - -     PHQ reviewed. Negative  Patient Active Problem List   Diagnosis Date Noted  . Osteoporosis 08/24/2017  . DNR (do not resuscitate) discussion 05/23/2015  . Diminished hearing 01/22/2015  . Low hemoglobin 10/15/2014  . Vitamin D deficiency 10/15/2014  . Hyperlipidemia 10/15/2014  . Retained  orthopedic hardware 04/14/2013  . Triggering of digit 02/03/2013  . Closed fracture of lower end of left radius with malunion 02/03/2013    Past Medical History:  Diagnosis Date  . Hyperlipidemia   . Low serum vitamin D   . Osteoporosis     Past Surgical History:  Procedure Laterality Date  . KNEE ARTHROSCOPY Right 01/2011   torn meniscus  . TRIGGER FINGER RELEASE Right May 2016.  . TUBAL LIGATION    . WRIST FRACTURE SURGERY      Social History   Tobacco Use  . Smoking status: Never Smoker  . Smokeless tobacco: Never Used  . Tobacco comment: smoking cessation materials not required  Substance Use Topics  . Alcohol use: Yes    Alcohol/week: 1.0 standard drinks    Types: 1 Glasses of wine per week    Frequency: Never    Comment: rare     Current Outpatient Medications:  .  calcium-vitamin D (SM CALCIUM 500/VITAMIN D3) 500-400 MG-UNIT per tablet, Take by mouth., Disp: , Rfl:  .  Coenzyme Q10 100 MG capsule, Take by mouth., Disp: , Rfl:  .  Flaxseed-Eve Prim-Borage (FLAX OIL XTRA) CAPS, Take by mouth., Disp: , Rfl:  .  Multiple Vitamin (MULTI-VITAMINS) TABS, Take by mouth., Disp: , Rfl:  .  Turmeric 500 MG CAPS, Take 2 capsules by mouth daily., Disp: , Rfl:  No Known Allergies  ROS   No other specific complaints in a complete review of systems (except as listed in HPI above).  Objective  There were no vitals filed for this visit.   There is no height or weight on file to calculate BMI.  Nursing Note and Vital Signs reviewed.  Physical Exam  Constitutional: Patient appears well-developed and well-nourished. No distress.  HENT: Head: Normocephalic and atraumatic. Patient endorses upper neck tenderness Pulmonary/Chest: Effort normal  Musculoskeletal: Normal range of motion,  Neurological: alert and oriented, speech normal. .  Psychiatric: Patient has a normal mood and affect. behavior is normal. Judgment and thought content normal.    Assessment &  Plan 1. Sore throat - amoxicillin-clavulanate (AUGMENTIN) 875-125 MG tablet; Take 1 tablet by mouth 2 (two) times daily.  Dispense: 20 tablet; Refill: 0  2. Lymphadenopathy of head and neck Discussed getting [place] due to pandemic has concern about being out- will trial antibiotic therapy to see if there is improvement and get [place] or refer to ENT if needed. Discussed red flag symptoms. Had long discussion about options, antibiotic risk and benefits and gut health.  - amoxicillin-clavulanate (AUGMENTIN) 875-125 MG tablet; Take 1 tablet by mouth 2 (two) times daily.  Dispense: 20 tablet; Refill: 0    Follow Up Instructions:   2 weeks PRN  I discussed the assessment and treatment plan with the patient. The patient was provided an opportunity to ask questions and all were answered. The patient agreed with the plan and demonstrated an understanding of the instructions.   The patient was advised to call back or seek an in-person evaluation if the symptoms worsen or if the condition fails to improve as anticipated.  I provided 23 minutes of non-face-to-face time during this encounter.   Fredderick Severance, NP

## 2018-10-20 ENCOUNTER — Telehealth: Payer: Self-pay | Admitting: Nurse Practitioner

## 2018-10-20 DIAGNOSIS — R591 Generalized enlarged lymph nodes: Secondary | ICD-10-CM

## 2018-10-20 DIAGNOSIS — J029 Acute pharyngitis, unspecified: Secondary | ICD-10-CM

## 2018-10-20 NOTE — Telephone Encounter (Signed)
Patient says augmentin is not helping and her sore throat is worse and she is very fatigued.

## 2018-10-21 ENCOUNTER — Other Ambulatory Visit: Payer: Self-pay

## 2018-10-21 MED ORDER — MAGIC MOUTHWASH W/LIDOCAINE: 5.0000 mL | Freq: Three times a day (TID) | ORAL | 0 refills | Status: DC | PRN

## 2018-10-21 NOTE — Telephone Encounter (Signed)
Pt wants to try the saline rinses gargles throughout the day and the cepacol cough drops. She said she is still walking 3 miles a day but feels so drained. She used to feel energized and now is just exhausted after and is concerned as this is abnormal for her. Please advise.

## 2018-10-21 NOTE — Addendum Note (Signed)
Addended by: Fredderick Severance on: 10/21/2018 12:40 PM   Modules accepted: Orders

## 2018-10-21 NOTE — Telephone Encounter (Signed)
Fatigue is a common during infection whether viral or bacterial. I recommend in addition to the saline rinses and lozenge, I recommend she rest and not push herself into activity over the next week to allow her body to recuperate. If her fatigue is not improving after a week OR lasting longer than a month please make follow-up appointment if it becomes severe with fevers, chills or confusion please go to ER. Drink at least 64 ounces of water a day, eat a diet with plenty of protein, vitamin a and c. ____ Cancelled ENT and magic mouthwash per patient request- let us know if she changes her mind.

## 2018-10-21 NOTE — Addendum Note (Signed)
Addended by: Suezanne Cheshire E on: 10/21/2018 02:00 PM   Modules accepted: Orders

## 2018-10-21 NOTE — Telephone Encounter (Signed)
Pt has decided she DOES want the referral to ENT. Was not happy with the advice given and said its not inside of her throat?

## 2018-10-21 NOTE — Telephone Encounter (Signed)
-  Referral to ENT placed.  -Please do saline gaggles frequently throughout the day- dissolve half a teaspoon of salt in a full glass of warm water and gargle the solution for a few seconds before spitting it out. -Use Cepacol lozenges  - Can additionally send magic mouthwash for relief if needed. (kim order printed fax to pharmacy if wanted)

## 2018-10-26 ENCOUNTER — Ambulatory Visit: Payer: Medicare HMO | Admitting: Nurse Practitioner

## 2019-02-24 ENCOUNTER — Other Ambulatory Visit: Payer: Self-pay

## 2019-02-24 DIAGNOSIS — Z20822 Contact with and (suspected) exposure to covid-19: Secondary | ICD-10-CM

## 2019-02-25 LAB — NOVEL CORONAVIRUS, NAA: SARS-CoV-2, NAA: NOT DETECTED

## 2019-03-22 ENCOUNTER — Ambulatory Visit (INDEPENDENT_AMBULATORY_CARE_PROVIDER_SITE_OTHER): Payer: Medicare HMO | Admitting: Internal Medicine

## 2019-03-22 ENCOUNTER — Other Ambulatory Visit: Payer: Self-pay

## 2019-03-22 ENCOUNTER — Encounter: Payer: Self-pay | Admitting: Internal Medicine

## 2019-03-22 VITALS — Ht 64.0 in | Wt 127.0 lb

## 2019-03-22 DIAGNOSIS — Z1389 Encounter for screening for other disorder: Secondary | ICD-10-CM

## 2019-03-22 DIAGNOSIS — M810 Age-related osteoporosis without current pathological fracture: Secondary | ICD-10-CM

## 2019-03-22 DIAGNOSIS — E041 Nontoxic single thyroid nodule: Secondary | ICD-10-CM | POA: Insufficient documentation

## 2019-03-22 DIAGNOSIS — Z1329 Encounter for screening for other suspected endocrine disorder: Secondary | ICD-10-CM

## 2019-03-22 DIAGNOSIS — R7303 Prediabetes: Secondary | ICD-10-CM

## 2019-03-22 DIAGNOSIS — Z1231 Encounter for screening mammogram for malignant neoplasm of breast: Secondary | ICD-10-CM

## 2019-03-22 DIAGNOSIS — E559 Vitamin D deficiency, unspecified: Secondary | ICD-10-CM

## 2019-03-22 DIAGNOSIS — Z7189 Other specified counseling: Secondary | ICD-10-CM

## 2019-03-22 DIAGNOSIS — Z0000 Encounter for general adult medical examination without abnormal findings: Secondary | ICD-10-CM

## 2019-03-22 DIAGNOSIS — Z66 Do not resuscitate: Secondary | ICD-10-CM

## 2019-03-22 DIAGNOSIS — Z13220 Encounter for screening for lipoid disorders: Secondary | ICD-10-CM

## 2019-03-22 NOTE — Progress Notes (Signed)
Called and scheduled labs with the patient. Fumi Guadron,cma

## 2019-03-22 NOTE — Progress Notes (Addendum)
Virtual Visit via Video Note  I connected with Stacey Munoz   on 03/22/19 at  9:30 AM EST by a video enabled telemedicine application and verified that I am speaking with the correct person using two identifiers.  Location patient: home Location provider:work or home office Persons participating in the virtual visit: patient, provider  I discussed the limitations of evaluation and management by telemedicine and the availability of in person appointments. The patient expressed understanding and agreed to proceed.   HPI: 1. Wants to discuss DNR form and what if she got covid what will that mean does not want intubation will also mail most form  2. hld declines statin h/o myalgia  3. Osteoporosis declines tx for this does exercise and will check vitamin D  4. Thyroid nodule benign bx 07/06/17     ROS: See pertinent positives and negatives per HPI. General: wt stable  Heent; no sore throat  Cv: no chest pain  Lungs: no sob  GI: no ab pain  GU: no issues MSK; no issues  Neuro: no h/a, memory issues  Skin no issues currently  Psych: no anxiety/depression   Past Medical History:  Diagnosis Date  . Hyperlipidemia   . Low serum vitamin D   . Osteoporosis     Past Surgical History:  Procedure Laterality Date  . FACIAL COSMETIC SURGERY    . KNEE ARTHROSCOPY Right 01/2011   torn meniscus  . TRIGGER FINGER RELEASE Right May 2016.  . TUBAL LIGATION    . WRIST FRACTURE SURGERY     left    Family History  Problem Relation Age of Onset  . Breast cancer Mother 5  . Hypertension Mother   . Hyperlipidemia Mother   . Diabetes Father   . Cancer Father        lymphoma   . Heart disease Brother   . Clotting disorder Brother        on Coumadin   . Breast cancer Paternal Grandmother        PT think it was cancer; never got check   . Kidney disease Maternal Grandmother        died young age when her mother was 39 y.o   . Cancer Maternal Grandfather   . Heart attack Maternal  Grandfather   . Other Paternal Grandfather        polio  . Gout Son     SOCIAL HX:  [employer] used to work  3 sons  DPR son Jeanmarie Hubert [phone] 7 grandchildren 1 boy and 6 girls  Used to horse back ride  Likes exercise silver sneakers, massage 1 x per month, yoga walks 2-3 miles daily and does weight lifting  Never smoker     Current Outpatient Medications:  .  calcium-vitamin D (SM CALCIUM 500/VITAMIN D3) 500-400 MG-UNIT per tablet, Take by mouth., Disp: , Rfl:  .  Coenzyme Q10 100 MG capsule, Take by mouth., Disp: , Rfl:  .  Flaxseed-Eve Prim-Borage (FLAX OIL XTRA) CAPS, Take by mouth., Disp: , Rfl:  .  Multiple Vitamin (MULTI-VITAMINS) TABS, Take by mouth., Disp: , Rfl:  .  Turmeric 500 MG CAPS, Take 2 capsules by mouth daily., Disp: , Rfl:  .  amoxicillin-clavulanate (AUGMENTIN) 875-125 MG tablet, Take 1 tablet by mouth 2 (two) times daily., Disp: 20 tablet, Rfl: 0  EXAM:  VITALS per patient if applicable:  GENERAL: alert, oriented, appears well and in no acute distress  HEENT: atraumatic, conjunttiva clear, no obvious  abnormalities on inspection of external nose and ears  NECK: normal movements of the head and neck  LUNGS: on inspection no signs of respiratory distress, breathing rate appears normal, no obvious gross SOB, gasping or wheezing  CV: no obvious cyanosis  MS: moves all visible extremities without noticeable abnormality  PSYCH/NEURO: pleasant and cooperative, no obvious depression or anxiety, speech and thought processing grossly intact  ASSESSMENT AND PLAN:  Discussed the following assessment and plan:  Age-related osteoporosis without current pathological fracture  Vitamin D deficiency - Plan: Vitamin D (25 hydroxy)  Prediabetes - Plan: HgB A1c  Thyroid nodule -07/06/17 bx negative   HM -disc DNR today form filled out and also most form mailed to pt  Flu shot utd  prevnar had  pna 22 01/16/13 rec  shingrix utd  Tdap had 06/02/09    mammo referred pt to call and schedule  No pap out of age window  Colonoscopy per pt had in 2018 in [place] need to track down copy Dr. Allen Norris no copy rec contact [facility] as of 03/31/2019   dexa 08/24/17 osteoporosis declines tx will check vit D rec D and calcium  Skin currently no issues sch fasting labs   -we discussed possible serious and likely etiologies, options for evaluation and workup, limitations of telemedicine visit vs in person visit, treatment, treatment risks and precautions. Pt prefers to treat via telemedicine empirically rather then risking or undertaking an in person visit at this moment. Patient agrees to seek prompt in person care if worsening, new symptoms arise, or if is not improving with treatment.   I discussed the assessment and treatment plan with the patient. The patient was provided an opportunity to ask questions and all were answered. The patient agreed with the plan and demonstrated an understanding of the instructions.   The patient was advised to call back or seek an in-person evaluation if the symptoms worsen or if the condition fails to improve as anticipated.  Time spent 30 minutes Delorise Jackson, MD

## 2019-03-22 NOTE — Patient Instructions (Signed)
Thyroid Nodule  A thyroid nodule is an isolated growth of thyroid cells that forms a lump in your thyroid gland. The thyroid gland is a butterfly-shaped gland. It is found in the lower front of your neck. This gland sends chemical messengers (hormones) through your blood to all parts of your body. These hormones are important in regulating your body temperature and helping your body to use energy. Thyroid nodules are common. Most are not cancerous (benign). You may have one nodule or several nodules. Different types of thyroid nodules include nodules that:  Grow and fill with fluid (thyroid cysts).  Produce too much thyroid hormone (hot nodules or hyperthyroid).  Produce no thyroid hormone (cold nodules or hypothyroid).  Form from cancer cells (thyroid cancers). What are the causes? In most cases, the cause of this condition is not known. What increases the risk? The following factors may make you more likely to develop this condition.  Age. Thyroid nodules become more common in people who are older than 74 years of age.  Gender. ? Benign thyroid nodules are more common in women. ? Cancerous (malignant) thyroid nodules are more common in men.  A family history that includes: ? Thyroid nodules. ? Pheochromocytoma. ? Thyroid carcinoma. ? Hyperparathyroidism.  Certain kinds of thyroid diseases, such as Hashimoto's thyroiditis.  Lack of iodine in your diet.  A history of head and neck radiation, such as from previous cancer treatment. What are the signs or symptoms? In many cases, there are no symptoms. If you have symptoms, they may include:  A lump in your lower neck.  Feeling a lump or tickle in your throat.  Pain in your neck, jaw, or ear.  Having trouble swallowing. Hot nodules may cause symptoms that include:  Weight loss.  Warm, flushed skin.  Feeling hot.  Feeling nervous.  A racing heartbeat. Cold nodules may cause symptoms that include:  Weight gain.   Dry skin.  Brittle hair. This may also occur with hair loss.  Feeling cold.  Fatigue. Thyroid cancer nodules may cause symptoms that include:  Hard nodules that feel stuck to the thyroid gland.  Hoarseness.  Lumps in the glands near your thyroid (lymph nodes). How is this diagnosed? A thyroid nodule may be felt by your health care provider during a physical exam. This condition may also be diagnosed based on your symptoms. You may also have tests, including:  An ultrasound. This may be done to confirm the diagnosis.  A biopsy. This involves taking a sample from the nodule and looking at it under a microscope.  Blood tests to make sure that your thyroid is working properly.  A thyroid scan. This test uses a radioactive tracer injected into a vein to create an image of the thyroid gland on a computer screen.  Imaging tests such as MRI or CT scan. These may be done if: ? Your nodule is large. ? Your nodule is blocking your airway. ? Cancer is suspected. How is this treated? Treatment depends on the cause and size of your nodule or nodules. If the nodule is benign, treatment may not be necessary. Your health care provider may monitor the nodule to see if it goes away without treatment. If the nodule continues to grow, is cancerous, or does not go away, treatment may be needed. Treatment may include:  Having a cystic nodule drained with a needle.  Ablation therapy. In this treatment, alcohol is injected into the area of the nodule to destroy the cells. Ablation with heat (  thermal ablation) may also be used.  Radioactive iodine. In this treatment, radioactive iodine is given as a pill or liquid that you drink. This substance causes the thyroid nodule to shrink.  Surgery to remove the nodule. Part or all of your thyroid gland may need to be removed as well.  Medicines. Follow these instructions at home:  Pay attention to any changes in your nodule.  Take over-the-counter and  prescription medicines only as told by your health care provider.  Keep all follow-up visits as told by your health care provider. This is important. Contact a health care provider if:  Your voice changes.  You have trouble swallowing.  You have pain in your neck, ear, or jaw that is getting worse.  Your nodule gets bigger.  Your nodule starts to make it harder for you to breathe.  Your muscles look like they are shrinking (muscle wasting). Get help right away if:  You have chest pain.  There is a loss of consciousness.  You have a sudden fever.  You feel confused.  You are seeing or hearing things that other people do not see or hear (having hallucinations).  You feel very weak.  You have mood swings.  You feel very restless.  You feel suddenly nauseous or throw up.  You suddenly have diarrhea. Summary  A thyroid nodule is an isolated growth of thyroid cells that forms a lump in your thyroid gland.  Thyroid nodules are common. Most are not cancerous (benign). You may have one nodule or several nodules.  Treatment depends on the cause and size of your nodule or nodules. If the nodule is benign, treatment may not be necessary.  Your health care provider may monitor the nodule to see if it goes away without treatment. If the nodule continues to grow, is cancerous, or does not go away, treatment may be needed. This information is not intended to replace advice given to you by your health care provider. Make sure you discuss any questions you have with your health care provider. Document Released: 03/06/2004 Document Revised: 11/26/2017 Document Reviewed: 11/29/2017 Elsevier Patient Education  2020 [employer].  Osteoporosis  Osteoporosis is thinning and loss of density in your bones. Osteoporosis makes bones more brittle and fragile and more likely to break (fracture). Over time, osteoporosis can cause your bones to become so weak that they fracture after a minor  fall. Bones in the hip, wrist, and spine are most likely to fracture due to osteoporosis. What are the causes? The exact cause of this condition is not known. What increases the risk? You may be at greater risk for osteoporosis if you:  Have a family history of the condition.  Have poor nutrition.  Use steroid medicines, such as prednisone.  Are female.  Are age 21 or older.  Smoke or have a history of smoking.  Are not physically active (are sedentary).  Are white (Caucasian) or of Asian descent.  Have a small body frame.  Take certain medicines, such as antiseizure medicines. What are the signs or symptoms? A fracture might be the first sign of osteoporosis, especially if the fracture results from a fall or injury that usually would not cause a bone to break. Other signs and symptoms include:  Pain in the neck or low back.  Stooped posture.  Loss of height. How is this diagnosed? This condition may be diagnosed based on:  Your medical history.  A physical exam.  A bone mineral density test, also called a  DXA or DEXA test (dual-energy X-ray absorptiometry test). This test uses X-rays to measure the amount of minerals in your bones. How is this treated? The goal of treatment is to strengthen your bones and lower your risk for a fracture. Treatment may involve:  Making lifestyle changes, such as: ? Including foods with more calcium and vitamin D in your diet. ? Doing weight-bearing and muscle-strengthening exercises. ? Stopping tobacco use. ? Limiting alcohol intake.  Taking medicine to slow the process of bone loss or to increase bone density.  Taking daily supplements of calcium and vitamin D.  Taking hormone replacement medicines, such as estrogen for women and testosterone for men.  Monitoring your levels of calcium and vitamin D. Follow these instructions at home:  Activity  Exercise as told by your health care provider. Ask your health care provider  what exercises and activities are safe for you. You should do: ? Exercises that make you work against gravity (weight-bearing exercises), such as tai chi, yoga, or walking. ? Exercises to strengthen muscles, such as lifting weights. Lifestyle  Limit alcohol intake to no more than 1 drink a day for nonpregnant women and 2 drinks a day for men. One drink equals 12 oz of beer, 5 oz of wine, or 1 oz of hard liquor.  Do not use any products that contain nicotine or tobacco, such as cigarettes and e-cigarettes. If you need help quitting, ask your health care provider. Preventing falls  Use devices to help you move around (mobility aids) as needed, such as canes, walkers, scooters, or crutches.  Keep rooms well-lit and clutter-free.  Remove tripping hazards from walkways, including cords and throw rugs.  Install grab bars in bathrooms and safety rails on stairs.  Use rubber mats in the bathroom and other areas that are often wet or slippery.  Wear closed-toe shoes that fit well and support your feet. Wear shoes that have rubber soles or low heels.  Review your medicines with your health care provider. Some medicines can cause dizziness or changes in blood pressure, which can increase your risk of falling. General instructions  Include calcium and vitamin D in your diet. Calcium is important for bone health, and vitamin D helps your body to absorb calcium. Good sources of calcium and vitamin D include: ? Certain fatty fish, such as salmon and tuna. ? Products that have calcium and vitamin D added to them (fortified products), such as fortified cereals. ? Egg yolks. ? Cheese. ? Liver.  Take over-the-counter and prescription medicines only as told by your health care provider.  Keep all follow-up visits as told by your health care provider. This is important. Contact a health care provider if:  You have never been screened for osteoporosis and you are: ? A woman who is age 25 or older.  ? A man who is age 85 or older. Get help right away if:  You fall or injure yourself. Summary  Osteoporosis is thinning and loss of density in your bones. This makes bones more brittle and fragile and more likely to break (fracture),even with minor falls.  The goal of treatment is to strengthen your bones and reduce your risk for a fracture.  Include calcium and vitamin D in your diet. Calcium is important for bone health, and vitamin D helps your body to absorb calcium.  Talk with your health care provider about screening for osteoporosis if you are a woman who is age 43 or older, or a man who is age 33  or older. This information is not intended to replace advice given to you by your health care provider. Make sure you discuss any questions you have with your health care provider. Document Released: 01/21/2005 Document Revised: 03/26/2017 Document Reviewed: 02/05/2017 Elsevier Patient Education  2020 [employer].

## 2019-03-30 ENCOUNTER — Ambulatory Visit
Admission: RE | Admit: 2019-03-30 | Discharge: 2019-03-30 | Disposition: A | Discharge status: Home / Self Care | Payer: Medicare HMO | Source: Ambulatory Visit | Attending: Internal Medicine | Admitting: Internal Medicine

## 2019-03-30 ENCOUNTER — Other Ambulatory Visit: Payer: Self-pay

## 2019-03-30 DIAGNOSIS — Z1231 Encounter for screening mammogram for malignant neoplasm of breast: Secondary | ICD-10-CM | POA: Diagnosis not present

## 2019-04-03 ENCOUNTER — Other Ambulatory Visit: Payer: Self-pay

## 2019-04-03 ENCOUNTER — Telehealth: Payer: Self-pay | Admitting: Internal Medicine

## 2019-04-03 ENCOUNTER — Encounter: Payer: Self-pay | Admitting: Internal Medicine

## 2019-04-03 ENCOUNTER — Other Ambulatory Visit (INDEPENDENT_AMBULATORY_CARE_PROVIDER_SITE_OTHER): Payer: Medicare HMO

## 2019-04-03 DIAGNOSIS — Z0000 Encounter for general adult medical examination without abnormal findings: Secondary | ICD-10-CM

## 2019-04-03 DIAGNOSIS — E559 Vitamin D deficiency, unspecified: Secondary | ICD-10-CM | POA: Diagnosis not present

## 2019-04-03 DIAGNOSIS — Z13220 Encounter for screening for lipoid disorders: Secondary | ICD-10-CM

## 2019-04-03 DIAGNOSIS — Z1329 Encounter for screening for other suspected endocrine disorder: Secondary | ICD-10-CM

## 2019-04-03 DIAGNOSIS — R7303 Prediabetes: Secondary | ICD-10-CM

## 2019-04-03 DIAGNOSIS — Z1389 Encounter for screening for other disorder: Secondary | ICD-10-CM

## 2019-04-03 LAB — CBC WITH DIFFERENTIAL/PLATELET
Basophils Absolute: 0 10*3/uL (ref 0.0–0.1)
Basophils Relative: 0.7 % (ref 0.0–3.0)
Eosinophils Absolute: 0.2 10*3/uL (ref 0.0–0.7)
Eosinophils Relative: 5.2 % — ABNORMAL HIGH (ref 0.0–5.0)
HCT: 40.9 % (ref 36.0–46.0)
Hemoglobin: 13.7 g/dL (ref 12.0–15.0)
Lymphocytes Relative: 41.6 % (ref 12.0–46.0)
Lymphs Abs: 1.6 10*3/uL (ref 0.7–4.0)
MCHC: 33.6 g/dL (ref 30.0–36.0)
MCV: 91.5 fl (ref 78.0–100.0)
Monocytes Absolute: 0.3 10*3/uL (ref 0.1–1.0)
Monocytes Relative: 6.7 % (ref 3.0–12.0)
Neutro Abs: 1.7 10*3/uL (ref 1.4–7.7)
Neutrophils Relative %: 45.8 % (ref 43.0–77.0)
Platelets: 233 10*3/uL (ref 150.0–400.0)
RBC: 4.46 Mil/uL (ref 3.87–5.11)
RDW: 13.4 % (ref 11.5–15.5)
WBC: 3.8 10*3/uL — ABNORMAL LOW (ref 4.0–10.5)

## 2019-04-03 LAB — LIPID PANEL
Cholesterol: 257 mg/dL — ABNORMAL HIGH (ref 0–200)
HDL: 68.9 mg/dL (ref >39.00)
LDL Cholesterol: 168 mg/dL — ABNORMAL HIGH (ref 0–99)
NonHDL: 188.33
Total CHOL/HDL Ratio: 4
Triglycerides: 103 mg/dL (ref 0.0–149.0)
VLDL: 20.6 mg/dL (ref 0.0–40.0)

## 2019-04-03 LAB — COMPREHENSIVE METABOLIC PANEL
ALT: 12 U/L (ref 0–35)
AST: 18 U/L (ref 0–37)
Albumin: 4.3 g/dL (ref 3.5–5.2)
Alkaline Phosphatase: 76 U/L (ref 39–117)
BUN: 11 mg/dL (ref 6–23)
CO2: 28 mEq/L (ref 19–32)
Calcium: 10 mg/dL (ref 8.4–10.5)
Chloride: 104 mEq/L (ref 96–112)
Creatinine, Ser: 0.8 mg/dL (ref 0.40–1.20)
GFR: 70.08 mL/min (ref >60.00)
Glucose, Bld: 104 mg/dL — ABNORMAL HIGH (ref 70–99)
Potassium: 3.9 mEq/L (ref 3.5–5.1)
Sodium: 140 mEq/L (ref 135–145)
Total Bilirubin: 1 mg/dL (ref 0.2–1.2)
Total Protein: 6.5 g/dL (ref 6.0–8.3)

## 2019-04-03 LAB — TSH: TSH: 1.77 u[IU]/mL (ref 0.35–4.50)

## 2019-04-03 LAB — VITAMIN D 25 HYDROXY (VIT D DEFICIENCY, FRACTURES): VITD: 52.42 ng/mL (ref 30.00–100.00)

## 2019-04-03 LAB — HEMOGLOBIN A1C: Hgb A1c MFr Bld: 5.7 % (ref 4.6–6.5)

## 2019-04-03 NOTE — Telephone Encounter (Signed)
Patient dropped of a DNR to be filled out by Dr. Olivia Mackie. Form is up front in color folder.

## 2019-04-03 NOTE — Telephone Encounter (Signed)
Form placed in provider box.

## 2019-04-04 LAB — URINALYSIS, ROUTINE W REFLEX MICROSCOPIC
Bacteria, UA: NONE SEEN /HPF
Bilirubin Urine: NEGATIVE
Glucose, UA: NEGATIVE
Hgb urine dipstick: NEGATIVE
Hyaline Cast: NONE SEEN /LPF
Ketones, ur: NEGATIVE
Nitrite: NEGATIVE
Protein, ur: NEGATIVE
RBC / HPF: NONE SEEN /HPF (ref 0–2)
Specific Gravity, Urine: 1.009 (ref 1.001–1.03)
Squamous Epithelial / LPF: NONE SEEN /HPF (ref <=5)
pH: 6.5 (ref 5.0–8.0)

## 2019-05-25 ENCOUNTER — Ambulatory Visit: Payer: Medicare HMO

## 2019-05-31 ENCOUNTER — Encounter: Payer: Self-pay | Admitting: Internal Medicine

## 2019-07-24 ENCOUNTER — Encounter: Payer: Self-pay | Admitting: Internal Medicine

## 2019-09-09 ENCOUNTER — Emergency Department: Admission: EM | Admit: 2019-09-09 | Discharge: 2019-09-09 | Discharge status: Absent without leave | Payer: Medicare HMO

## 2019-09-26 ENCOUNTER — Other Ambulatory Visit: Payer: Self-pay

## 2019-09-27 ENCOUNTER — Telehealth: Payer: Self-pay | Admitting: *Deleted

## 2019-09-27 ENCOUNTER — Other Ambulatory Visit: Payer: Self-pay | Admitting: Internal Medicine

## 2019-09-27 DIAGNOSIS — E782 Mixed hyperlipidemia: Secondary | ICD-10-CM

## 2019-09-27 DIAGNOSIS — R7303 Prediabetes: Secondary | ICD-10-CM

## 2019-09-27 DIAGNOSIS — D72819 Decreased white blood cell count, unspecified: Secondary | ICD-10-CM

## 2019-09-27 NOTE — Telephone Encounter (Signed)
Please place future orders for lab appt.  

## 2019-09-28 ENCOUNTER — Ambulatory Visit: Payer: Medicare HMO | Admitting: Internal Medicine

## 2019-09-28 ENCOUNTER — Other Ambulatory Visit: Payer: Self-pay

## 2019-09-28 ENCOUNTER — Telehealth: Payer: Self-pay | Admitting: *Deleted

## 2019-09-28 ENCOUNTER — Other Ambulatory Visit (INDEPENDENT_AMBULATORY_CARE_PROVIDER_SITE_OTHER): Payer: Medicare HMO

## 2019-09-28 ENCOUNTER — Other Ambulatory Visit: Payer: Self-pay | Admitting: Internal Medicine

## 2019-09-28 DIAGNOSIS — E538 Deficiency of other specified B group vitamins: Secondary | ICD-10-CM | POA: Diagnosis not present

## 2019-09-28 DIAGNOSIS — R7303 Prediabetes: Secondary | ICD-10-CM

## 2019-09-28 DIAGNOSIS — E782 Mixed hyperlipidemia: Secondary | ICD-10-CM | POA: Diagnosis not present

## 2019-09-28 DIAGNOSIS — D72819 Decreased white blood cell count, unspecified: Secondary | ICD-10-CM | POA: Diagnosis not present

## 2019-09-28 DIAGNOSIS — E611 Iron deficiency: Secondary | ICD-10-CM

## 2019-09-28 LAB — IRON,TIBC AND FERRITIN PANEL
%SAT: 25 % (calc) (ref 16–45)
Ferritin: 74 ng/mL (ref 16–288)
Iron: 71 ug/dL (ref 45–160)
TIBC: 285 mcg/dL (calc) (ref 250–450)

## 2019-09-28 LAB — LIPID PANEL
Cholesterol: 278 mg/dL — ABNORMAL HIGH (ref 0–200)
HDL: 73 mg/dL (ref >39.00)
LDL Cholesterol: 187 mg/dL — ABNORMAL HIGH (ref 0–99)
NonHDL: 204.56
Total CHOL/HDL Ratio: 4
Triglycerides: 87 mg/dL (ref 0.0–149.0)
VLDL: 17.4 mg/dL (ref 0.0–40.0)

## 2019-09-28 LAB — CBC WITH DIFFERENTIAL/PLATELET
Basophils Absolute: 0 10*3/uL (ref 0.0–0.1)
Basophils Relative: 0.8 % (ref 0.0–3.0)
Eosinophils Absolute: 0.1 10*3/uL (ref 0.0–0.7)
Eosinophils Relative: 2.9 % (ref 0.0–5.0)
HCT: 38.9 % (ref 36.0–46.0)
Hemoglobin: 13.4 g/dL (ref 12.0–15.0)
Lymphocytes Relative: 41.6 % (ref 12.0–46.0)
Lymphs Abs: 1.4 10*3/uL (ref 0.7–4.0)
MCHC: 34.5 g/dL (ref 30.0–36.0)
MCV: 91.4 fl (ref 78.0–100.0)
Monocytes Absolute: 0.2 10*3/uL (ref 0.1–1.0)
Monocytes Relative: 7.2 % (ref 3.0–12.0)
Neutro Abs: 1.6 10*3/uL (ref 1.4–7.7)
Neutrophils Relative %: 47.5 % (ref 43.0–77.0)
Platelets: 197 10*3/uL (ref 150.0–400.0)
RBC: 4.26 Mil/uL (ref 3.87–5.11)
RDW: 13.7 % (ref 11.5–15.5)
WBC: 3.3 10*3/uL — ABNORMAL LOW (ref 4.0–10.5)

## 2019-09-28 LAB — COMPREHENSIVE METABOLIC PANEL
ALT: 21 U/L (ref 0–35)
AST: 20 U/L (ref 0–37)
Albumin: 4.3 g/dL (ref 3.5–5.2)
Alkaline Phosphatase: 68 U/L (ref 39–117)
BUN: 14 mg/dL (ref 6–23)
CO2: 29 mEq/L (ref 19–32)
Calcium: 10 mg/dL (ref 8.4–10.5)
Chloride: 102 mEq/L (ref 96–112)
Creatinine, Ser: 0.79 mg/dL (ref 0.40–1.20)
GFR: 71.01 mL/min (ref >60.00)
Glucose, Bld: 98 mg/dL (ref 70–99)
Potassium: 4 mEq/L (ref 3.5–5.1)
Sodium: 137 mEq/L (ref 135–145)
Total Bilirubin: 0.7 mg/dL (ref 0.2–1.2)
Total Protein: 6.9 g/dL (ref 6.0–8.3)

## 2019-09-28 LAB — VITAMIN B12: Vitamin B-12: 487 pg/mL (ref 211–911)

## 2019-09-28 LAB — FOLATE: Folate: >24.8 ng/mL (ref >5.9)

## 2019-09-28 LAB — HEMOGLOBIN A1C: Hgb A1c MFr Bld: 5.9 % (ref 4.6–6.5)

## 2019-09-28 NOTE — Telephone Encounter (Signed)
D labs drawn this morning & mentioned that she was supposed to have her iron checked also & that she spoke with someone who mentioned that it was ordered. Please place future order of agreeable.

## 2019-09-28 NOTE — Telephone Encounter (Signed)
Added iron, b12, folate  TMS

## 2019-09-28 NOTE — Addendum Note (Signed)
Addended by: Leeanne Rio on: 09/28/2019 10:08 AM   Modules accepted: Orders

## 2019-09-29 NOTE — Telephone Encounter (Signed)
thanks

## 2019-11-08 IMAGING — CT CT MAXILLOFACIAL W/O CM
3 of 6 series · 15 of 47 positions shown, 18 images · non-contrast
Comparison: None.

CLINICAL DATA: 71-year-old female with progressive chronic left
face pain near the eye and ear. No numbness or tingling. Reports
left ear pruritus and hearing loss.

EXAM:
CT MAXILLOFACIAL WITHOUT CONTRAST
TECHNIQUE: Multidetector CT imaging of the maxillofacial structures was
performed. Multiplanar CT image reconstructions were also generated.

[Series 2: orbit 2.00 hr68 s3 · axial · 0.33mm/px · z∈[-652,-520]mm · 10 of 78 slices shown, 13 images]
[im 6/78  brain]
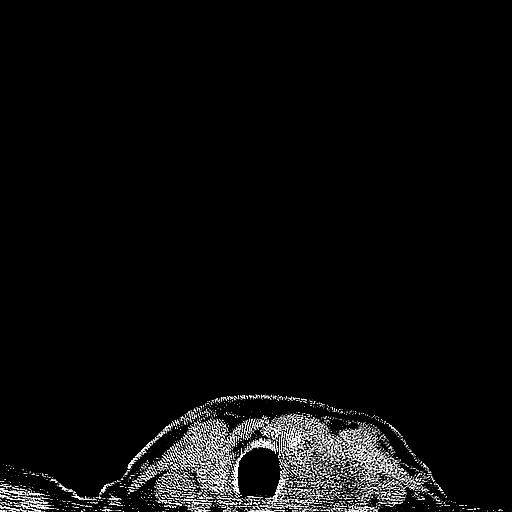
[im 6/78  bone]
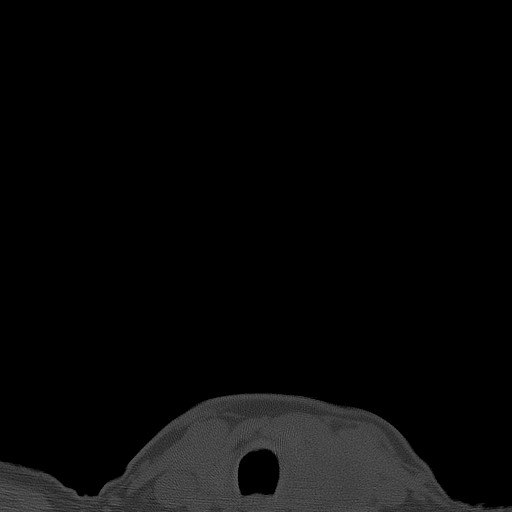
[im 12/78  bone]
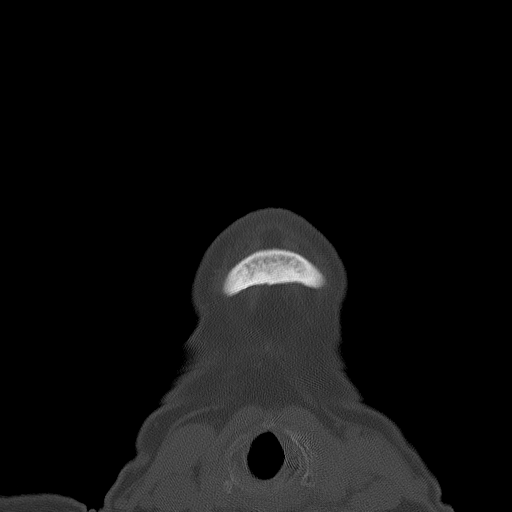
[im 23/78  bone]
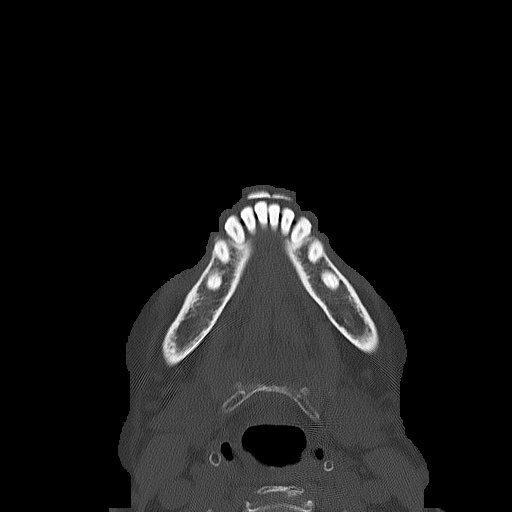
[im 28/78  bone]
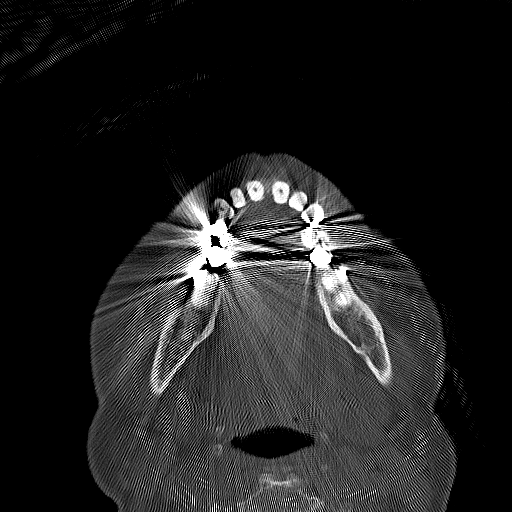
[im 34/78  brain]
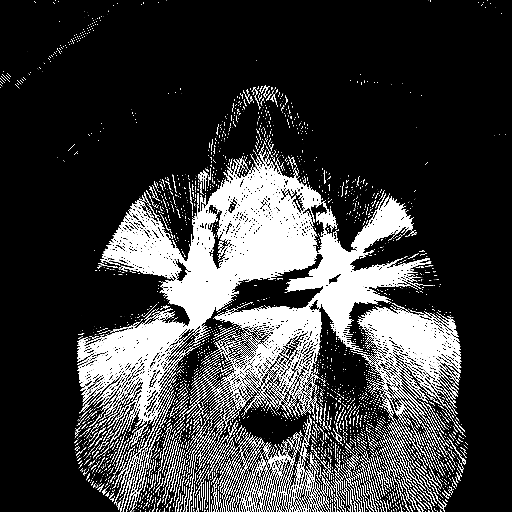
[im 34/78  bone]
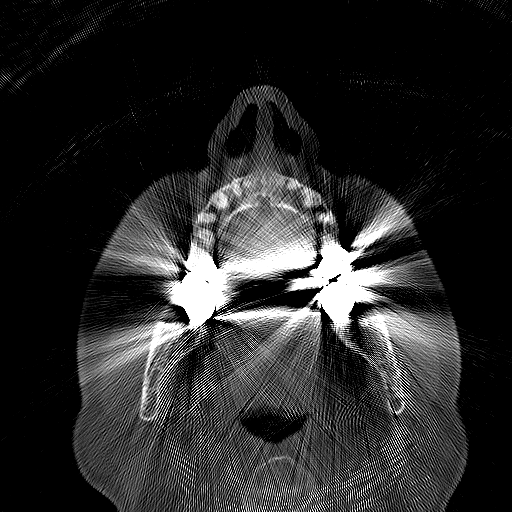
[im 45/78  bone]
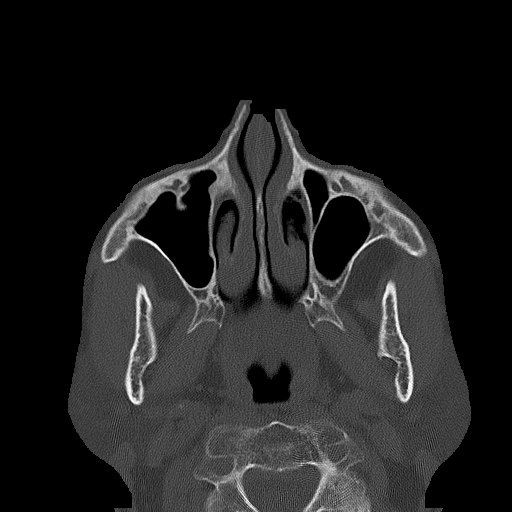
[im 50/78  bone]
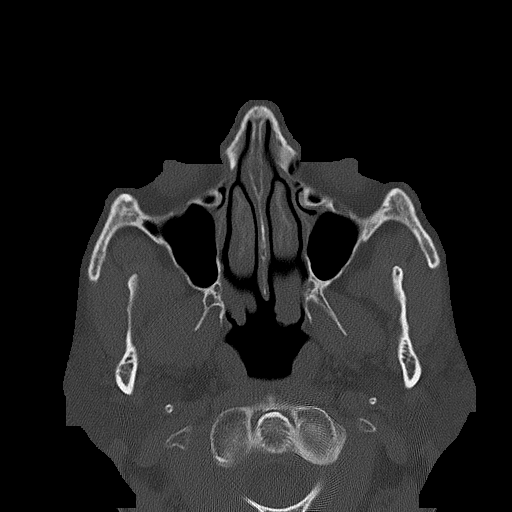
[im 56/78  bone]
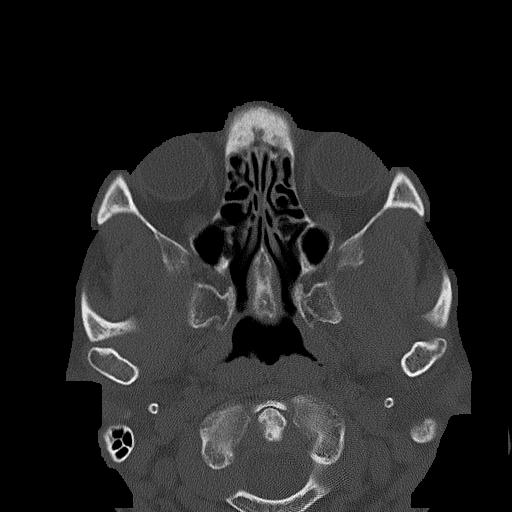
[im 67/78  brain]
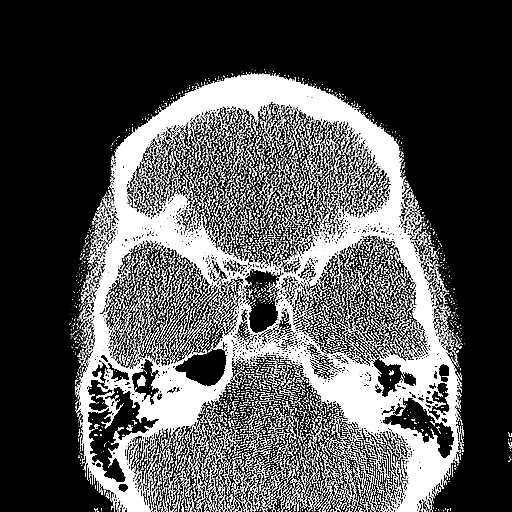
[im 67/78  bone]
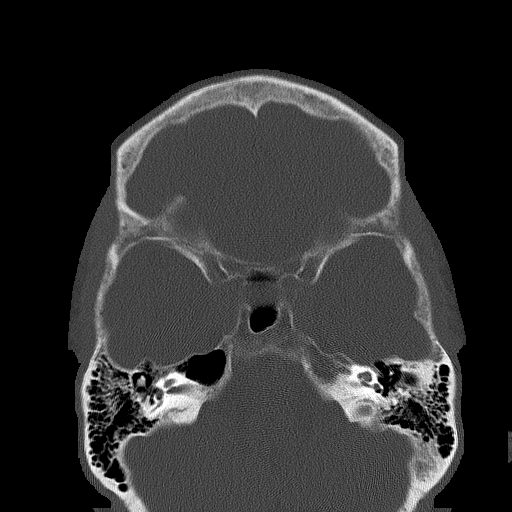
[im 72/78  bone]
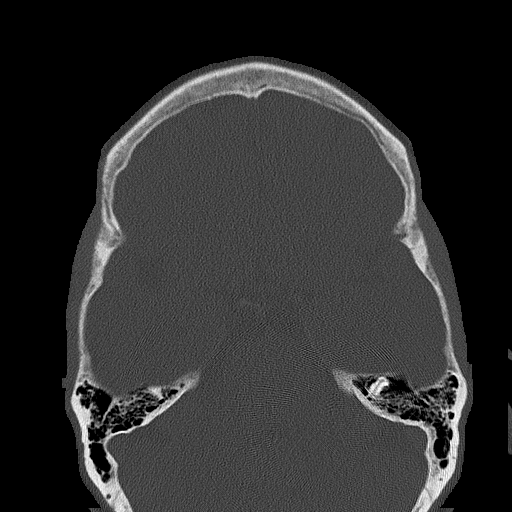

[Series 5: orbit 2.00 hr68 s3 cor · coronal · 0.33mm/px · 3 of 83 slices shown]
[im 22/83  bone]
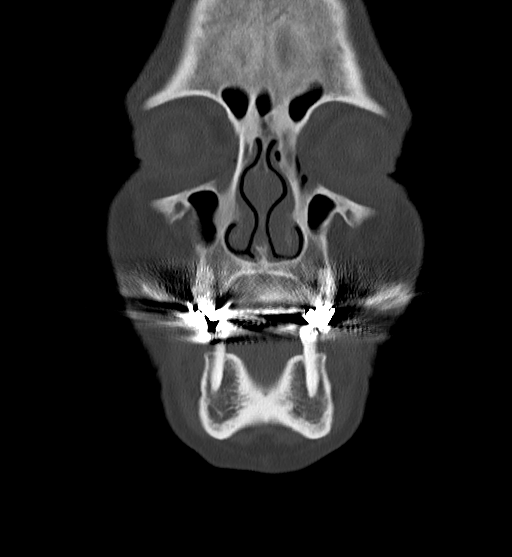
[im 42/83  bone]
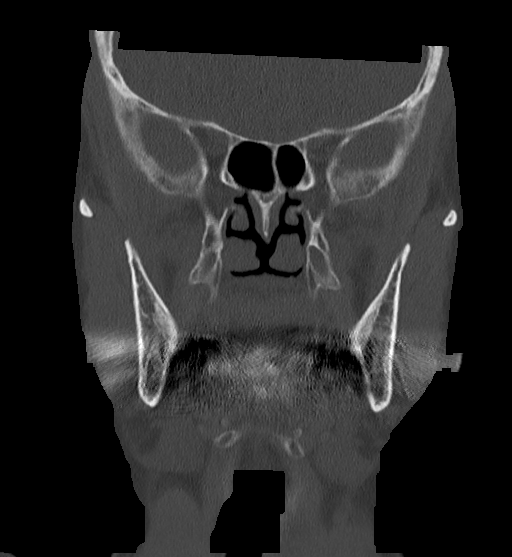
[im 61/83  bone]
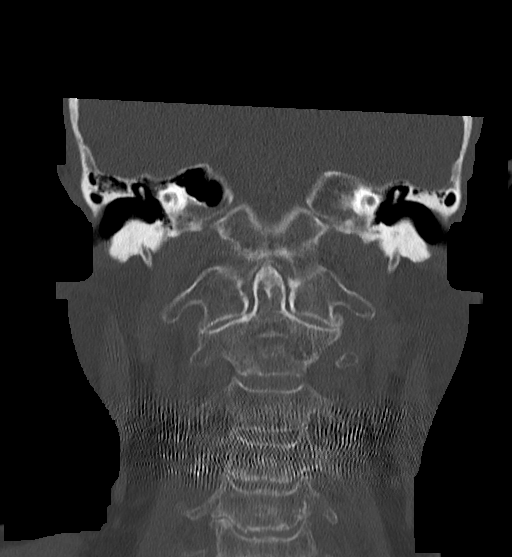

[Series 21: orbit 2.00 hr40 s3 sag · sagittal · 0.33mm/px · 2 of 83 slices shown]
[im 28/83  bone]
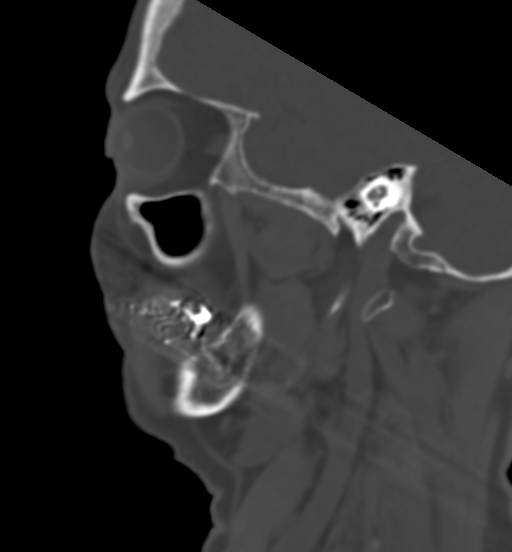
[im 55/83  bone]
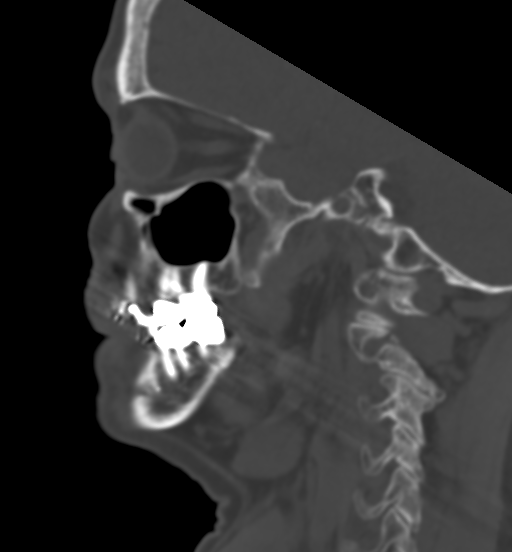

[15 of 47 positions shown; findings below may reference images not displayed]

FINDINGS: Osseous: Intact mandible. No TMJ degeneration. Maxilla, zygoma, and
nasal bones are intact. Intact central skull base with osteopenia.
No acute osseous abnormality identified. There is chronic cervical
facet degeneration on the left at C2-C3, and chronic lower cervical
disc degeneration with disc space loss.

Orbits: Intact orbital walls. Symmetric and normal bilateral orbits
soft tissues. No orbital mass or inflammation.

Sinuses: Clear.  Bilateral tympanic cavities and mastoids are clear.

The bilateral periauricular soft tissues appear symmetric and
normal.

Soft tissues: Partially visible thyroid is remarkable for left lobe
enlargement due to a hypodense left lobe nodule or mass measuring at
least 31 millimeters diameter (series 4, image 78). Mild regional
mass effect. No surrounding soft tissue infiltration is evident.

Negative visible larynx, pharynx, parapharyngeal spaces,
retropharyngeal space, sublingual space, submandibular glands and
parotid glands. No upper cervical lymphadenopathy.

Limited intracranial: Negative visible noncontrast brain parenchyma.
Mild Calcified atherosclerosis at the skull base.
IMPRESSION: 1. Negative noncontrast Face CT. No explanation for facial pain or
ear symptoms.
2. Enlarged left thyroid lobe containing a nodule at least 3.1 cm
diameter. This meets consensus guidelines for follow-up dedicated
Thyroid Ultrasound characterization.

## 2020-04-02 ENCOUNTER — Other Ambulatory Visit: Payer: Self-pay

## 2020-04-02 ENCOUNTER — Ambulatory Visit: Payer: Medicare HMO | Admitting: Internal Medicine

## 2020-04-02 ENCOUNTER — Encounter: Payer: Self-pay | Admitting: Internal Medicine

## 2020-04-02 VITALS — BP 122/72 | HR 80 | Temp 97.4°F | Ht 64.0 in | Wt 129.8 lb

## 2020-04-02 DIAGNOSIS — E559 Vitamin D deficiency, unspecified: Secondary | ICD-10-CM

## 2020-04-02 DIAGNOSIS — Z0000 Encounter for general adult medical examination without abnormal findings: Secondary | ICD-10-CM

## 2020-04-02 DIAGNOSIS — R7303 Prediabetes: Secondary | ICD-10-CM | POA: Diagnosis not present

## 2020-04-02 DIAGNOSIS — M47816 Spondylosis without myelopathy or radiculopathy, lumbar region: Secondary | ICD-10-CM | POA: Diagnosis not present

## 2020-04-02 DIAGNOSIS — D72819 Decreased white blood cell count, unspecified: Secondary | ICD-10-CM

## 2020-04-02 DIAGNOSIS — M5416 Radiculopathy, lumbar region: Secondary | ICD-10-CM | POA: Diagnosis not present

## 2020-04-02 DIAGNOSIS — E785 Hyperlipidemia, unspecified: Secondary | ICD-10-CM

## 2020-04-02 DIAGNOSIS — E041 Nontoxic single thyroid nodule: Secondary | ICD-10-CM | POA: Diagnosis not present

## 2020-04-02 DIAGNOSIS — Z1231 Encounter for screening mammogram for malignant neoplasm of breast: Secondary | ICD-10-CM

## 2020-04-02 DIAGNOSIS — M5442 Lumbago with sciatica, left side: Secondary | ICD-10-CM

## 2020-04-02 DIAGNOSIS — Z1329 Encounter for screening for other suspected endocrine disorder: Secondary | ICD-10-CM | POA: Diagnosis not present

## 2020-04-02 DIAGNOSIS — G8929 Other chronic pain: Secondary | ICD-10-CM

## 2020-04-02 DIAGNOSIS — Z1389 Encounter for screening for other disorder: Secondary | ICD-10-CM

## 2020-04-02 DIAGNOSIS — E7849 Other hyperlipidemia: Secondary | ICD-10-CM

## 2020-04-02 DIAGNOSIS — M810 Age-related osteoporosis without current pathological fracture: Secondary | ICD-10-CM

## 2020-04-02 LAB — COMPREHENSIVE METABOLIC PANEL
ALT: 14 U/L (ref 0–35)
AST: 17 U/L (ref 0–37)
Albumin: 4.4 g/dL (ref 3.5–5.2)
Alkaline Phosphatase: 65 U/L (ref 39–117)
BUN: 11 mg/dL (ref 6–23)
CO2: 30 mEq/L (ref 19–32)
Calcium: 9.8 mg/dL (ref 8.4–10.5)
Chloride: 104 mEq/L (ref 96–112)
Creatinine, Ser: 0.8 mg/dL (ref 0.40–1.20)
GFR: 72.2 mL/min (ref >60.00)
Glucose, Bld: 96 mg/dL (ref 70–99)
Potassium: 3.9 mEq/L (ref 3.5–5.1)
Sodium: 139 mEq/L (ref 135–145)
Total Bilirubin: 0.8 mg/dL (ref 0.2–1.2)
Total Protein: 6.7 g/dL (ref 6.0–8.3)

## 2020-04-02 LAB — TSH: TSH: 1.85 u[IU]/mL (ref 0.35–4.50)

## 2020-04-02 LAB — CBC WITH DIFFERENTIAL/PLATELET
Basophils Absolute: 0 10*3/uL (ref 0.0–0.1)
Basophils Relative: 0.6 % (ref 0.0–3.0)
Eosinophils Absolute: 0.1 10*3/uL (ref 0.0–0.7)
Eosinophils Relative: 4.3 % (ref 0.0–5.0)
HCT: 39.7 % (ref 36.0–46.0)
Hemoglobin: 13.5 g/dL (ref 12.0–15.0)
Lymphocytes Relative: 37.9 % (ref 12.0–46.0)
Lymphs Abs: 1.3 10*3/uL (ref 0.7–4.0)
MCHC: 33.9 g/dL (ref 30.0–36.0)
MCV: 92.4 fl (ref 78.0–100.0)
Monocytes Absolute: 0.2 10*3/uL (ref 0.1–1.0)
Monocytes Relative: 6.4 % (ref 3.0–12.0)
Neutro Abs: 1.7 10*3/uL (ref 1.4–7.7)
Neutrophils Relative %: 50.8 % (ref 43.0–77.0)
Platelets: 204 10*3/uL (ref 150.0–400.0)
RBC: 4.3 Mil/uL (ref 3.87–5.11)
RDW: 13.4 % (ref 11.5–15.5)
WBC: 3.3 10*3/uL — ABNORMAL LOW (ref 4.0–10.5)

## 2020-04-02 LAB — VITAMIN D 25 HYDROXY (VIT D DEFICIENCY, FRACTURES): VITD: 51.56 ng/mL (ref 30.00–100.00)

## 2020-04-02 LAB — HEMOGLOBIN A1C: Hgb A1c MFr Bld: 5.6 % (ref 4.6–6.5)

## 2020-04-02 NOTE — Patient Instructions (Signed)
Low Back Sprain or Strain Rehab Ask your health care provider which exercises are safe for you. Do exercises exactly as told by your health care provider and adjust them as directed. It is normal to feel mild stretching, pulling, tightness, or discomfort as you do these exercises. Stop right away if you feel sudden pain or your pain gets worse. Do not begin these exercises until told by your health care provider. Stretching and range-of-motion exercises These exercises warm up your muscles and joints and improve the movement and flexibility of your back. These exercises also help to relieve pain, numbness, and tingling. Lumbar rotation  1. Lie on your back on a firm surface and bend your knees. 2. Straighten your arms out to your sides so each arm forms a 90-degree angle (right angle) with a side of your body. 3. Slowly move (rotate) both of your knees to one side of your body until you feel a stretch in your lower back (lumbar). Try not to let your shoulders lift off the floor. 4. Hold this position for __________ seconds. 5. Tense your abdominal muscles and slowly move your knees back to the starting position. 6. Repeat this exercise on the other side of your body. Repeat __________ times. Complete this exercise __________ times a day. Single knee to chest  1. Lie on your back on a firm surface with both legs straight. 2. Bend one of your knees. Use your hands to move your knee up toward your chest until you feel a gentle stretch in your lower back and buttock. ? Hold your leg in this position by holding on to the front of your knee. ? Keep your other leg as straight as possible. 3. Hold this position for __________ seconds. 4. Slowly return to the starting position. 5. Repeat with your other leg. Repeat __________ times. Complete this exercise __________ times a day. Prone extension on elbows  1. Lie on your abdomen on a firm surface (prone position). 2. Prop yourself up on your  elbows. 3. Use your arms to help lift your chest up until you feel a gentle stretch in your abdomen and your lower back. ? This will place some of your body weight on your elbows. If this is uncomfortable, try stacking pillows under your chest. ? Your hips should stay down, against the surface that you are lying on. Keep your hip and back muscles relaxed. 4. Hold this position for __________ seconds. 5. Slowly relax your upper body and return to the starting position. Repeat __________ times. Complete this exercise __________ times a day. Strengthening exercises These exercises build strength and endurance in your back. Endurance is the ability to use your muscles for a long time, even after they get tired. Pelvic tilt This exercise strengthens the muscles that lie deep in the abdomen. 1. Lie on your back on a firm surface. Bend your knees and keep your feet flat on the floor. 2. Tense your abdominal muscles. Tip your pelvis up toward the ceiling and flatten your lower back into the floor. ? To help with this exercise, you may place a small towel under your lower back and try to push your back into the towel. 3. Hold this position for __________ seconds. 4. Let your muscles relax completely before you repeat this exercise. Repeat __________ times. Complete this exercise __________ times a day. Alternating arm and leg raises  1. Get on your hands and knees on a firm surface. If you are on a hard floor, you   may want to use padding, such as an exercise mat, to cushion your knees. 2. Line up your arms and legs. Your hands should be directly below your shoulders, and your knees should be directly below your hips. 3. Lift your left leg behind you. At the same time, raise your right arm and straighten it in front of you. ? Do not lift your leg higher than your hip. ? Do not lift your arm higher than your shoulder. ? Keep your abdominal and back muscles tight. ? Keep your hips facing the  ground. ? Do not arch your back. ? Keep your balance carefully, and do not hold your breath. 4. Hold this position for __________ seconds. 5. Slowly return to the starting position. 6. Repeat with your right leg and your left arm. Repeat __________ times. Complete this exercise __________ times a day. Abdominal set with straight leg raise  1. Lie on your back on a firm surface. 2. Bend one of your knees and keep your other leg straight. 3. Tense your abdominal muscles and lift your straight leg up, 4-6 inches (10-15 cm) off the ground. 4. Keep your abdominal muscles tight and hold this position for __________ seconds. ? Do not hold your breath. ? Do not arch your back. Keep it flat against the ground. 5. Keep your abdominal muscles tense as you slowly lower your leg back to the starting position. 6. Repeat with your other leg. Repeat __________ times. Complete this exercise __________ times a day. Single leg lower with bent knees 1. Lie on your back on a firm surface. 2. Tense your abdominal muscles and lift your feet off the floor, one foot at a time, so your knees and hips are bent in 90-degree angles (right angles). ? Your knees should be over your hips and your lower legs should be parallel to the floor. 3. Keeping your abdominal muscles tense and your knee bent, slowly lower one of your legs so your toe touches the ground. 4. Lift your leg back up to return to the starting position. ? Do not hold your breath. ? Do not let your back arch. Keep your back flat against the ground. 5. Repeat with your other leg. Repeat __________ times. Complete this exercise __________ times a day. Posture and body mechanics Good posture and healthy body mechanics can help to relieve stress in your body's tissues and joints. Body mechanics refers to the movements and positions of your body while you do your daily activities. Posture is part of body mechanics. Good posture means:  Your spine is in its  natural S-curve position (neutral).  Your shoulders are pulled back slightly.  Your head is not tipped forward. Follow these guidelines to improve your posture and body mechanics in your everyday activities. Standing   When standing, keep your spine neutral and your feet about hip width apart. Keep a slight bend in your knees. Your ears, shoulders, and hips should line up.  When you do a task in which you stand in one place for a long time, place one foot up on a stable object that is 2-4 inches (5-10 cm) high, such as a footstool. This helps keep your spine neutral. Sitting   When sitting, keep your spine neutral and keep your feet flat on the floor. Use a footrest, if necessary, and keep your thighs parallel to the floor. Avoid rounding your shoulders, and avoid tilting your head forward.  When working at a desk or a computer, keep your desk   at a height where your hands are slightly lower than your elbows. Slide your chair under your desk so you are close enough to maintain good posture.  When working at a computer, place your monitor at a height where you are looking straight ahead and you do not have to tilt your head forward or downward to look at the screen. Resting  When lying down and resting, avoid positions that are most painful for you.  If you have pain with activities such as sitting, bending, stooping, or squatting, lie in a position in which your body does not bend very much. For example, avoid curling up on your side with your arms and knees near your chest (fetal position).  If you have pain with activities such as standing for a long time or reaching with your arms, lie with your spine in a neutral position and bend your knees slightly. Try the following positions: ? Lying on your side with a pillow between your knees. ? Lying on your back with a pillow under your knees. Lifting   When lifting objects, keep your feet at least shoulder width apart and tighten your  abdominal muscles.  Bend your knees and hips and keep your spine neutral. It is important to lift using the strength of your legs, not your back. Do not lock your knees straight out.  Always ask for help to lift heavy or awkward objects. This information is not intended to replace advice given to you by your health care provider. Make sure you discuss any questions you have with your health care provider. Document Revised: 08/05/2018 Document Reviewed: 05/05/2018 Elsevier Patient Education  2020 [employer]  Back Exercises The following exercises strengthen the muscles that help to support the trunk and back. They also help to keep the lower back flexible. Doing these exercises can help to prevent back pain or lessen existing pain.  If you have back pain or discomfort, try doing these exercises 2-3 times each day or as told by your health care provider.  As your pain improves, do them once each day, but increase the number of times that you repeat the steps for each exercise (do more repetitions).  To prevent the recurrence of back pain, continue to do these exercises once each day or as told by your health care provider. Do exercises exactly as told by your health care provider and adjust them as directed. It is normal to feel mild stretching, pulling, tightness, or discomfort as you do these exercises, but you should stop right away if you feel sudden pain or your pain gets worse. Exercises Single knee to chest Repeat these steps 3-5 times for each leg: 1. Lie on your back on a firm bed or the floor with your legs extended. 2. Bring one knee to your chest. Your other leg should stay extended and in contact with the floor. 3. Hold your knee in place by grabbing your knee or thigh with both hands and hold. 4. Pull on your knee until you feel a gentle stretch in your lower back or buttocks. 5. Hold the stretch for 10-30 seconds. 6. Slowly release and straighten your leg. Pelvic  tilt Repeat these steps 5-10 times: 1. Lie on your back on a firm bed or the floor with your legs extended. 2. Bend your knees so they are pointing toward the ceiling and your feet are flat on the floor. 3. Tighten your lower abdominal muscles to press your lower back against the floor. This   motion will tilt your pelvis so your tailbone points up toward the ceiling instead of pointing to your feet or the floor. 4. With gentle tension and even breathing, hold this position for 5-10 seconds. Cat-cow Repeat these steps until your lower back becomes more flexible: 1. Get into a hands-and-knees position on a firm surface. Keep your hands under your shoulders, and keep your knees under your hips. You may place padding under your knees for comfort. 2. Let your head hang down toward your chest. Contract your abdominal muscles and point your tailbone toward the floor so your lower back becomes rounded like the back of a cat. 3. Hold this position for 5 seconds. 4. Slowly lift your head, let your abdominal muscles relax and point your tailbone up toward the ceiling so your back forms a sagging arch like the back of a cow. 5. Hold this position for 5 seconds.  Press-ups Repeat these steps 5-10 times: 1. Lie on your abdomen (face-down) on the floor. 2. Place your palms near your head, about shoulder-width apart. 3. Keeping your back as relaxed as possible and keeping your hips on the floor, slowly straighten your arms to raise the top half of your body and lift your shoulders. Do not use your back muscles to raise your upper torso. You may adjust the placement of your hands to make yourself more comfortable. 4. Hold this position for 5 seconds while you keep your back relaxed. 5. Slowly return to lying flat on the floor.  Bridges Repeat these steps 10 times: 1. Lie on your back on a firm surface. 2. Bend your knees so they are pointing toward the ceiling and your feet are flat on the floor. Your arms  should be flat at your sides, next to your body. 3. Tighten your buttocks muscles and lift your buttocks off the floor until your waist is at almost the same height as your knees. You should feel the muscles working in your buttocks and the back of your thighs. If you do not feel these muscles, slide your feet 1-2 inches farther away from your buttocks. 4. Hold this position for 3-5 seconds. 5. Slowly lower your hips to the starting position, and allow your buttocks muscles to relax completely. If this exercise is too easy, try doing it with your arms crossed over your chest. Abdominal crunches Repeat these steps 5-10 times: 1. Lie on your back on a firm bed or the floor with your legs extended. 2. Bend your knees so they are pointing toward the ceiling and your feet are flat on the floor. 3. Cross your arms over your chest. 4. Tip your chin slightly toward your chest without bending your neck. 5. Tighten your abdominal muscles and slowly raise your trunk (torso) high enough to lift your shoulder blades a tiny bit off the floor. Avoid raising your torso higher than that because it can put too much stress on your low back and does not help to strengthen your abdominal muscles. 6. Slowly return to your starting position. Back lifts Repeat these steps 5-10 times: 1. Lie on your abdomen (face-down) with your arms at your sides, and rest your forehead on the floor. 2. Tighten the muscles in your legs and your buttocks. 3. Slowly lift your chest off the floor while you keep your hips pressed to the floor. Keep the back of your head in line with the curve in your back. Your eyes should be looking at the floor. 4. Hold this position   for 3-5 seconds. 5. Slowly return to your starting position. Contact a health care provider if:  Your back pain or discomfort gets much worse when you do an exercise.  Your worsening back pain or discomfort does not lessen within 2 hours after you exercise. If you have  any of these problems, stop doing these exercises right away. Do not do them again unless your health care provider says that you can. Get help right away if:  You develop sudden, severe back pain. If this happens, stop doing the exercises right away. Do not do them again unless your health care provider says that you can. This information is not intended to replace advice given to you by your health care provider. Make sure you discuss any questions you have with your health care provider. Document Revised: 08/18/2018 Document Reviewed: 01/13/2018 Elsevier Patient Education  2020 Elsevier Inc.  

## 2020-04-02 NOTE — Progress Notes (Signed)
Chief Complaint  Patient presents with  . Follow-up   Annual  1. Doing well  2. C/o left lower back pain rad to left leg 6/10 seeing [facility] today able to walk 3 mi 4x per week and 2 miles other 3 days pain since spring 2021 nothing tried pain started in left food  3. Thyroid nodule s/p bx 06/2017 negative will wait thyroid US until 2022 f/u   Review of Systems  Constitutional: Negative for weight loss.  HENT: Negative for hearing loss.   Eyes: Negative for blurred vision.  Respiratory: Negative for shortness of breath.   Cardiovascular: Negative for chest pain.  Musculoskeletal: Positive for back pain.  Skin: Negative for rash.  Neurological: Negative for headaches.  Psychiatric/Behavioral: Negative for depression.   Past Medical History:  Diagnosis Date  . Hyperlipidemia   . Low serum vitamin D   . Osteoporosis    Past Surgical History:  Procedure Laterality Date  . FACIAL COSMETIC SURGERY    . KNEE ARTHROSCOPY Right 01/2011   torn meniscus  . TRIGGER FINGER RELEASE Right May 2016.  . TUBAL LIGATION    . WRIST FRACTURE SURGERY     left   Family History  Problem Relation Age of Onset  . Breast cancer Mother 66  . Hypertension Mother   . Hyperlipidemia Mother   . Diabetes Father   . Cancer Father        lymphoma   . Heart disease Brother   . Clotting disorder Brother        on Coumadin   . Breast cancer Paternal Grandmother        PT think it was cancer; never got check   . Kidney disease Maternal Grandmother        died young age when her mother was 71 y.o   . Cancer Maternal Grandfather   . Heart attack Maternal Grandfather   . Other Paternal Grandfather        polio  . Gout Son    Social History   Socioeconomic History  . Marital status: Single    Spouse name: Not on file  . Number of children: 3  . Years of education: Not on file  . Highest education level: Doctorate  Occupational History    Employer: RETIRED  Tobacco Use  . Smoking  status: Never Smoker  . Smokeless tobacco: Never Used  . Tobacco comment: smoking cessation materials not required  Vaping Use  . Vaping Use: Never used  Substance and Sexual Activity  . Alcohol use: Yes    Alcohol/week: 1.0 standard drink    Types: 1 Glasses of wine per week    Comment: rare  . Drug use: No  . Sexual activity: Not Currently  Other Topics Concern  . Not on file  Social History Narrative   [employer] used to work    3 sons    DPR son Jeanmarie Hubert [phone]   7 grandchildren 1 boy and 6 girls    Used to horse back ride    Likes exercise silver sneakers, massage 1 x per month, yoga walks 2-3 miles daily and does weight lifting    Never smoker    Social Determinants of Health   Financial Resource Strain:   . Difficulty of Paying Living Expenses: Not on file  Food Insecurity:   . Worried About Charity fundraiser in the Last Year: Not on file  . Ran Out of Food in the Last  Year: Not on file  Transportation Needs:   . Lack of Transportation (Medical): Not on file  . Lack of Transportation (Non-Medical): Not on file  Physical Activity:   . Days of Exercise per Week: Not on file  . Minutes of Exercise per Session: Not on file  Stress:   . Feeling of Stress : Not on file  Social Connections:   . Frequency of Communication with Friends and Family: Not on file  . Frequency of Social Gatherings with Friends and Family: Not on file  . Attends Religious Services: Not on file  . Active Member of Clubs or Organizations: Not on file  . Attends Archivist Meetings: Not on file  . Marital Status: Not on file  Intimate Partner Violence:   . Fear of Current or Ex-Partner: Not on file  . Emotionally Abused: Not on file  . Physically Abused: Not on file  . Sexually Abused: Not on file   Current Meds  Medication Sig  . calcium-vitamin D (SM CALCIUM 500/VITAMIN D3) 500-400 MG-UNIT per tablet Take by mouth.  . Coenzyme Q10 100 MG capsule Take by mouth.   Randell Loop Prim-Borage (FLAX OIL XTRA) CAPS Take by mouth.  . Multiple Vitamin (MULTI-VITAMINS) TABS Take by mouth.  . Turmeric 500 MG CAPS Take 2 capsules by mouth daily.   Allergies  Allergen Reactions  . Statins     Muscle aches    No results found for this or any previous visit (from the past 2160 hour(s)). Objective  Body mass index is 22.28 kg/m. Wt Readings from Last 3 Encounters:  04/02/20 129 lb 12.8 oz (58.9 kg)  03/22/19 127 lb (57.6 kg)  05/19/18 134 lb 4.8 oz (60.9 kg)   Temp Readings from Last 3 Encounters:  04/02/20 (!) 97.4 F (36.3 C) (Oral)  05/19/18 98 F (36.7 C) (Oral)  09/08/17 97.8 F (36.6 C) (Oral)   BP Readings from Last 3 Encounters:  04/02/20 122/72  05/19/18 108/68  12/07/17 108/64   Pulse Readings from Last 3 Encounters:  04/02/20 80  05/19/18 78  12/07/17 86    Physical Exam Vitals and nursing note reviewed.  Constitutional:      Appearance: Normal appearance. She is well-developed.  HENT:     Head: Normocephalic and atraumatic.  Eyes:     Conjunctiva/sclera: Conjunctivae normal.     Pupils: Pupils are equal, round, and reactive to light.  Cardiovascular:     Rate and Rhythm: Normal rate and regular rhythm.     Heart sounds: Normal heart sounds. No murmur heard.   Pulmonary:     Effort: Pulmonary effort is normal.     Breath sounds: Normal breath sounds.  Skin:    General: Skin is warm and dry.  Neurological:     General: No focal deficit present.     Mental Status: She is alert and oriented to person, place, and time. Mental status is at baseline.     Gait: Gait normal.  Psychiatric:        Attention and Perception: Attention and perception normal.        Mood and Affect: Mood and affect normal.        Speech: Speech normal.        Behavior: Behavior normal. Behavior is cooperative.        Thought Content: Thought content normal.        Cognition and Memory: Cognition and memory normal.        Judgment: Judgment  normal.     Assessment  Plan  Annual physical exam -  disc DNR today form filled out and also most form mailed to pt body will be donated to [employer] Flu shot utd  prevnar had  moderna 2/2 booster 3rd utd 01/2020 02/23/20 [facility] [id number]  pna 23 01/16/13 rec  shingrix utd  Tdap had 06/02/09   mammo declines 03/2020 wait consider 03/2021 No pap out of age window  Colonoscopy per pt had in 2018 in [place] need to track down copy Dr. Allen Norris no copy rec contact [facility] as of 03/31/2019   dexa 08/24/17 osteoporosis declines tx will check vit D rec D and calcium  +osteoporosis  Skin currently no issues sch fasting labs  rec healthy diet and exercise  Chronic left-sided low back pain with left-sided sciatica Arthritis of lumbar spine Left lumbar radiculopathy F/u [facility] today   Thyroid nodule Wait 2022 for thyroid US repeat  TSH   Leukopenia, unspecified type - Plan: CBC w/Diff, Pathologist smear review  Vitamin D deficiency disease - Plan: Vitamin D (25 hydroxy)  Prediabetes - Plan: Hemoglobin A1c  Hyperlipidemia, unspecified hyperlipidemia type - Plan: Lipid Cascade  Declines statin    Provider: Dr. Olivia Mackie McLean-Scocuzza-[facility]

## 2020-04-03 LAB — LIPID CASCADE
Cholesterol, Total: 243 mg/dL — ABNORMAL HIGH (ref 100–199)
HDL: 63 mg/dL (ref >39)
LDL Chol Calc (NIH): 158 mg/dL — ABNORMAL HIGH (ref 0–99)
LDL/HDL Ratio: 2.5 ratio (ref 0.0–3.2)
Total Non-HDL-Chol (LDL+VLDL): 180 mg/dL — ABNORMAL HIGH (ref 0–129)
Triglycerides: 124 mg/dL (ref 0–149)

## 2020-04-03 LAB — PATHOLOGIST SMEAR REVIEW

## 2020-04-03 LAB — URINALYSIS, ROUTINE W REFLEX MICROSCOPIC
Bilirubin Urine: NEGATIVE
Glucose, UA: NEGATIVE
Hgb urine dipstick: NEGATIVE
Ketones, ur: NEGATIVE
Leukocytes,Ua: NEGATIVE
Nitrite: NEGATIVE
Protein, ur: NEGATIVE
Specific Gravity, Urine: 1.005 (ref 1.001–1.03)
pH: 7 (ref 5.0–8.0)

## 2020-04-03 LAB — NO SPECIMEN RECEIVED

## 2020-04-05 ENCOUNTER — Encounter: Payer: Self-pay | Admitting: Internal Medicine

## 2020-04-08 NOTE — Addendum Note (Signed)
Addended by: Orland Mustard on: 04/08/2020 07:21 AM   Modules accepted: Orders

## 2020-04-08 NOTE — Telephone Encounter (Signed)
-----   Message from Delorise Jackson, MD sent at 04/08/2020  7:16 AM EST ----- WBC ct slightly low and stable but smear of blood not concerning  Thyroid lab normal  Vitamin D normal  A1C no longer prediabetes  Liver kidneys normal Urine normal Cholesterol elevated. Ive noted she previously stated no statins  -still waiting on LP-a, CRP HS, particle size

## 2020-04-09 NOTE — Telephone Encounter (Signed)
Faxed to [facility] specimen details faxed on 04-09-20

## 2020-04-10 LAB — SPECIMEN STATUS REPORT

## 2020-04-10 LAB — LIPOPROTEIN A (LPA): Lipoprotein (a): 180.2 nmol/L — ABNORMAL HIGH (ref <75.0)

## 2020-04-10 LAB — HIGH SENSITIVITY CRP: CRP, High Sensitivity: 0.63 mg/L (ref 0.00–3.00)

## 2020-04-30 DIAGNOSIS — M62830 Muscle spasm of back: Secondary | ICD-10-CM | POA: Diagnosis not present

## 2020-04-30 DIAGNOSIS — M5416 Radiculopathy, lumbar region: Secondary | ICD-10-CM | POA: Diagnosis not present

## 2020-04-30 DIAGNOSIS — M5136 Other intervertebral disc degeneration, lumbar region: Secondary | ICD-10-CM | POA: Diagnosis not present

## 2020-04-30 DIAGNOSIS — M9903 Segmental and somatic dysfunction of lumbar region: Secondary | ICD-10-CM | POA: Diagnosis not present

## 2020-05-02 DIAGNOSIS — M5416 Radiculopathy, lumbar region: Secondary | ICD-10-CM | POA: Diagnosis not present

## 2020-05-02 DIAGNOSIS — M9903 Segmental and somatic dysfunction of lumbar region: Secondary | ICD-10-CM | POA: Diagnosis not present

## 2020-05-02 DIAGNOSIS — H903 Sensorineural hearing loss, bilateral: Secondary | ICD-10-CM | POA: Diagnosis not present

## 2020-05-02 DIAGNOSIS — M5136 Other intervertebral disc degeneration, lumbar region: Secondary | ICD-10-CM | POA: Diagnosis not present

## 2020-05-02 DIAGNOSIS — M62830 Muscle spasm of back: Secondary | ICD-10-CM | POA: Diagnosis not present

## 2020-05-06 DIAGNOSIS — H2511 Age-related nuclear cataract, right eye: Secondary | ICD-10-CM | POA: Diagnosis not present

## 2020-05-07 DIAGNOSIS — M5416 Radiculopathy, lumbar region: Secondary | ICD-10-CM | POA: Diagnosis not present

## 2020-05-07 DIAGNOSIS — M62830 Muscle spasm of back: Secondary | ICD-10-CM | POA: Diagnosis not present

## 2020-05-07 DIAGNOSIS — M5136 Other intervertebral disc degeneration, lumbar region: Secondary | ICD-10-CM | POA: Diagnosis not present

## 2020-05-07 DIAGNOSIS — M9903 Segmental and somatic dysfunction of lumbar region: Secondary | ICD-10-CM | POA: Diagnosis not present

## 2020-05-09 ENCOUNTER — Encounter: Payer: Self-pay | Admitting: Internal Medicine

## 2020-05-09 DIAGNOSIS — M5136 Other intervertebral disc degeneration, lumbar region: Secondary | ICD-10-CM | POA: Diagnosis not present

## 2020-05-09 DIAGNOSIS — M9903 Segmental and somatic dysfunction of lumbar region: Secondary | ICD-10-CM | POA: Diagnosis not present

## 2020-05-09 DIAGNOSIS — M62830 Muscle spasm of back: Secondary | ICD-10-CM | POA: Diagnosis not present

## 2020-05-09 DIAGNOSIS — M5416 Radiculopathy, lumbar region: Secondary | ICD-10-CM | POA: Diagnosis not present

## 2020-05-15 DIAGNOSIS — M62830 Muscle spasm of back: Secondary | ICD-10-CM | POA: Diagnosis not present

## 2020-05-15 DIAGNOSIS — M5416 Radiculopathy, lumbar region: Secondary | ICD-10-CM | POA: Diagnosis not present

## 2020-05-15 DIAGNOSIS — M9903 Segmental and somatic dysfunction of lumbar region: Secondary | ICD-10-CM | POA: Diagnosis not present

## 2020-05-15 DIAGNOSIS — M5136 Other intervertebral disc degeneration, lumbar region: Secondary | ICD-10-CM | POA: Diagnosis not present

## 2020-05-17 DIAGNOSIS — M5136 Other intervertebral disc degeneration, lumbar region: Secondary | ICD-10-CM | POA: Diagnosis not present

## 2020-05-17 DIAGNOSIS — M9903 Segmental and somatic dysfunction of lumbar region: Secondary | ICD-10-CM | POA: Diagnosis not present

## 2020-05-17 DIAGNOSIS — M62830 Muscle spasm of back: Secondary | ICD-10-CM | POA: Diagnosis not present

## 2020-05-17 DIAGNOSIS — M5416 Radiculopathy, lumbar region: Secondary | ICD-10-CM | POA: Diagnosis not present

## 2020-05-22 DIAGNOSIS — M62830 Muscle spasm of back: Secondary | ICD-10-CM | POA: Diagnosis not present

## 2020-05-22 DIAGNOSIS — M5416 Radiculopathy, lumbar region: Secondary | ICD-10-CM | POA: Diagnosis not present

## 2020-05-22 DIAGNOSIS — M9903 Segmental and somatic dysfunction of lumbar region: Secondary | ICD-10-CM | POA: Diagnosis not present

## 2020-05-22 DIAGNOSIS — M5136 Other intervertebral disc degeneration, lumbar region: Secondary | ICD-10-CM | POA: Diagnosis not present

## 2020-05-29 DIAGNOSIS — M62830 Muscle spasm of back: Secondary | ICD-10-CM | POA: Diagnosis not present

## 2020-05-29 DIAGNOSIS — M5416 Radiculopathy, lumbar region: Secondary | ICD-10-CM | POA: Diagnosis not present

## 2020-05-29 DIAGNOSIS — M9903 Segmental and somatic dysfunction of lumbar region: Secondary | ICD-10-CM | POA: Diagnosis not present

## 2020-05-29 DIAGNOSIS — M5136 Other intervertebral disc degeneration, lumbar region: Secondary | ICD-10-CM | POA: Diagnosis not present

## 2020-06-19 DIAGNOSIS — M9903 Segmental and somatic dysfunction of lumbar region: Secondary | ICD-10-CM | POA: Diagnosis not present

## 2020-06-19 DIAGNOSIS — M5136 Other intervertebral disc degeneration, lumbar region: Secondary | ICD-10-CM | POA: Diagnosis not present

## 2020-06-19 DIAGNOSIS — M5416 Radiculopathy, lumbar region: Secondary | ICD-10-CM | POA: Diagnosis not present

## 2020-06-19 DIAGNOSIS — M62830 Muscle spasm of back: Secondary | ICD-10-CM | POA: Diagnosis not present

## 2020-07-10 DIAGNOSIS — M5416 Radiculopathy, lumbar region: Secondary | ICD-10-CM | POA: Diagnosis not present

## 2020-07-10 DIAGNOSIS — M5136 Other intervertebral disc degeneration, lumbar region: Secondary | ICD-10-CM | POA: Diagnosis not present

## 2020-07-10 DIAGNOSIS — M62830 Muscle spasm of back: Secondary | ICD-10-CM | POA: Diagnosis not present

## 2020-07-10 DIAGNOSIS — M9903 Segmental and somatic dysfunction of lumbar region: Secondary | ICD-10-CM | POA: Diagnosis not present

## 2020-08-02 DIAGNOSIS — M5136 Other intervertebral disc degeneration, lumbar region: Secondary | ICD-10-CM | POA: Diagnosis not present

## 2020-08-02 DIAGNOSIS — M5416 Radiculopathy, lumbar region: Secondary | ICD-10-CM | POA: Diagnosis not present

## 2020-08-02 DIAGNOSIS — M62830 Muscle spasm of back: Secondary | ICD-10-CM | POA: Diagnosis not present

## 2020-08-02 DIAGNOSIS — M9903 Segmental and somatic dysfunction of lumbar region: Secondary | ICD-10-CM | POA: Diagnosis not present

## 2020-08-29 DIAGNOSIS — M9903 Segmental and somatic dysfunction of lumbar region: Secondary | ICD-10-CM | POA: Diagnosis not present

## 2020-08-29 DIAGNOSIS — M62830 Muscle spasm of back: Secondary | ICD-10-CM | POA: Diagnosis not present

## 2020-08-29 DIAGNOSIS — M5416 Radiculopathy, lumbar region: Secondary | ICD-10-CM | POA: Diagnosis not present

## 2020-08-29 DIAGNOSIS — M5136 Other intervertebral disc degeneration, lumbar region: Secondary | ICD-10-CM | POA: Diagnosis not present

## 2020-09-11 ENCOUNTER — Telehealth: Payer: Self-pay | Admitting: Internal Medicine

## 2020-09-11 NOTE — Telephone Encounter (Signed)
Pt called and wanted to get a Dexa and a mammogram scheduled

## 2020-09-12 NOTE — Telephone Encounter (Signed)
Please advise, order for mammogram was already placed

## 2020-09-16 ENCOUNTER — Other Ambulatory Visit: Payer: Self-pay | Admitting: Internal Medicine

## 2020-09-16 DIAGNOSIS — M810 Age-related osteoporosis without current pathological fracture: Secondary | ICD-10-CM

## 2020-09-16 NOTE — Telephone Encounter (Signed)
Mammogram order was in  Placed dexa  She can call [facility] to schedule   Sch f/u appt by 04/02/21 in office with me that will be 1 year sooner if needed   thanks

## 2020-09-17 NOTE — Telephone Encounter (Signed)
Pt is aware and phone number to [facility] was given to pt. Pt already has her appt scheduled.

## 2020-10-01 ENCOUNTER — Ambulatory Visit
Admission: RE | Admit: 2020-10-01 | Discharge: 2020-10-01 | Disposition: A | Discharge status: Home / Self Care | Payer: Medicare HMO | Source: Ambulatory Visit | Attending: Internal Medicine | Admitting: Internal Medicine

## 2020-10-01 ENCOUNTER — Other Ambulatory Visit: Payer: Self-pay

## 2020-10-01 ENCOUNTER — Encounter: Payer: Self-pay | Admitting: Internal Medicine

## 2020-10-01 DIAGNOSIS — M810 Age-related osteoporosis without current pathological fracture: Secondary | ICD-10-CM | POA: Insufficient documentation

## 2020-10-01 DIAGNOSIS — Z780 Asymptomatic menopausal state: Secondary | ICD-10-CM | POA: Diagnosis not present

## 2020-10-01 DIAGNOSIS — Z1231 Encounter for screening mammogram for malignant neoplasm of breast: Secondary | ICD-10-CM

## 2020-10-01 DIAGNOSIS — M47816 Spondylosis without myelopathy or radiculopathy, lumbar region: Secondary | ICD-10-CM | POA: Insufficient documentation

## 2020-10-01 NOTE — Telephone Encounter (Signed)
-----   Message from Delorise Jackson, MD sent at 10/01/2020  1:06 PM EDT ----- +osteoporosis  Does she want to see endocrine for treatment options?

## 2020-10-01 NOTE — Addendum Note (Signed)
Addended by: Orland Mustard on: 10/01/2020 04:38 PM   Modules accepted: Orders

## 2020-10-03 DIAGNOSIS — M5416 Radiculopathy, lumbar region: Secondary | ICD-10-CM | POA: Diagnosis not present

## 2020-10-03 DIAGNOSIS — M9903 Segmental and somatic dysfunction of lumbar region: Secondary | ICD-10-CM | POA: Diagnosis not present

## 2020-10-03 DIAGNOSIS — M5136 Other intervertebral disc degeneration, lumbar region: Secondary | ICD-10-CM | POA: Diagnosis not present

## 2020-10-03 DIAGNOSIS — M62830 Muscle spasm of back: Secondary | ICD-10-CM | POA: Diagnosis not present

## 2020-10-17 DIAGNOSIS — M62830 Muscle spasm of back: Secondary | ICD-10-CM | POA: Diagnosis not present

## 2020-10-17 DIAGNOSIS — M5416 Radiculopathy, lumbar region: Secondary | ICD-10-CM | POA: Diagnosis not present

## 2020-10-17 DIAGNOSIS — M5136 Other intervertebral disc degeneration, lumbar region: Secondary | ICD-10-CM | POA: Diagnosis not present

## 2020-10-17 DIAGNOSIS — M9903 Segmental and somatic dysfunction of lumbar region: Secondary | ICD-10-CM | POA: Diagnosis not present

## 2020-11-06 ENCOUNTER — Telehealth: Payer: Self-pay | Admitting: Internal Medicine

## 2020-11-06 NOTE — Telephone Encounter (Signed)
Rejection Reason - Patient did not respond - Patient has not responded to attempts to schedule appointment. Please confirm that patient does desire an appt with endocrinology. If so, please place a new referral." Stacey Munoz said on Nov 06, 2020 10:35 AM  Msg from [facility]

## 2020-11-06 NOTE — Telephone Encounter (Signed)
Update from lat note: msg from [facility] endo

## 2020-11-08 DIAGNOSIS — U071 COVID-19: Secondary | ICD-10-CM | POA: Diagnosis not present

## 2020-11-08 DIAGNOSIS — Z20822 Contact with and (suspected) exposure to covid-19: Secondary | ICD-10-CM | POA: Diagnosis not present

## 2020-11-15 DIAGNOSIS — Z20822 Contact with and (suspected) exposure to covid-19: Secondary | ICD-10-CM | POA: Diagnosis not present

## 2020-12-05 DIAGNOSIS — M5136 Other intervertebral disc degeneration, lumbar region: Secondary | ICD-10-CM | POA: Diagnosis not present

## 2020-12-05 DIAGNOSIS — M62830 Muscle spasm of back: Secondary | ICD-10-CM | POA: Diagnosis not present

## 2020-12-05 DIAGNOSIS — M9903 Segmental and somatic dysfunction of lumbar region: Secondary | ICD-10-CM | POA: Diagnosis not present

## 2020-12-05 DIAGNOSIS — M5416 Radiculopathy, lumbar region: Secondary | ICD-10-CM | POA: Diagnosis not present

## 2020-12-06 ENCOUNTER — Telehealth: Payer: Self-pay

## 2020-12-06 NOTE — Telephone Encounter (Signed)
Patient called and states that she needs travel forms filled out before the end of this month. She wants to come drop them off. Please advise

## 2020-12-09 NOTE — Telephone Encounter (Signed)
Patient scheduled to come in for appointment with Dr Caryl Bis 12/18/20 at 11:00 am and will bring the paperwork with her.

## 2020-12-17 DIAGNOSIS — R490 Dysphonia: Secondary | ICD-10-CM | POA: Diagnosis not present

## 2020-12-17 DIAGNOSIS — R0982 Postnasal drip: Secondary | ICD-10-CM | POA: Diagnosis not present

## 2020-12-18 ENCOUNTER — Ambulatory Visit: Payer: Medicare HMO | Admitting: Family Medicine

## 2020-12-26 DIAGNOSIS — M9903 Segmental and somatic dysfunction of lumbar region: Secondary | ICD-10-CM | POA: Diagnosis not present

## 2020-12-26 DIAGNOSIS — M5416 Radiculopathy, lumbar region: Secondary | ICD-10-CM | POA: Diagnosis not present

## 2020-12-26 DIAGNOSIS — M5136 Other intervertebral disc degeneration, lumbar region: Secondary | ICD-10-CM | POA: Diagnosis not present

## 2020-12-26 DIAGNOSIS — M62830 Muscle spasm of back: Secondary | ICD-10-CM | POA: Diagnosis not present

## 2020-12-27 ENCOUNTER — Other Ambulatory Visit: Payer: Self-pay

## 2020-12-27 ENCOUNTER — Ambulatory Visit (INDEPENDENT_AMBULATORY_CARE_PROVIDER_SITE_OTHER): Payer: Medicare HMO | Admitting: Family Medicine

## 2020-12-27 ENCOUNTER — Encounter: Payer: Self-pay | Admitting: Family Medicine

## 2020-12-27 DIAGNOSIS — Z789 Other specified health status: Secondary | ICD-10-CM

## 2020-12-27 NOTE — Progress Notes (Signed)
Tommi Rumps, MD Phone: [phone]  Stacey Munoz is a 75 y.o. female who presents today for follow-up.  Pretravel visit: Patient she is traveling to [place].  She has a form that she needs filled out saying that she is physically fit to make that journey.  She notes no chronic cardiac or lung issues.  She notes no chest pain or shortness of breath.  She has no chronic GI issues.  She had COVID in July and notes no residual effects.  She does occasionally see a chiropractor as her spine was crooked and they adjust her and she gets massage as well.  She walks up to 3 miles every day.  She does yoga and tai chi daily.  She does occasionally have some allergy type symptoms though does not use medication consistently for this.  This will be her seventh continent.  Social History   Tobacco Use  Smoking Status Never  Smokeless Tobacco Never  Tobacco Comments   smoking cessation materials not required    Current Outpatient Medications on File Prior to Visit  Medication Sig Dispense Refill   calcium-vitamin D (OSCAL-500) 500-400 MG-UNIT tablet Take by mouth.     Flaxseed-Eve Prim-Borage (FLAX OIL XTRA) CAPS Take by mouth.     ipratropium (ATROVENT) 0.03 % nasal spray Place 2 sprays into both nostrils every 12 (twelve) hours.     Multiple Vitamin (MULTI-VITAMINS) TABS Take by mouth.     Turmeric 500 MG CAPS Take 2 capsules by mouth daily.     Coenzyme Q10 100 MG capsule Take by mouth.     No current facility-administered medications on file prior to visit.     ROS see history of present illness  Objective  Physical Exam Vitals:   12/27/20 1519  BP: 118/78  Pulse: 92  Temp: 98.5 F (36.9 C)  SpO2: 97%    BP Readings from Last 3 Encounters:  12/27/20 118/78  04/02/20 122/72  05/19/18 108/68   Wt Readings from Last 3 Encounters:  12/27/20 130 lb 12.8 oz (59.3 kg)  04/02/20 129 lb 12.8 oz (58.9 kg)  03/22/19 127 lb (57.6 kg)    Physical Exam Constitutional:       General: She is not in acute distress.    Appearance: She is not diaphoretic.  Cardiovascular:     Rate and Rhythm: Normal rate and regular rhythm.     Heart sounds: Normal heart sounds.  Pulmonary:     Effort: Pulmonary effort is normal.     Breath sounds: Normal breath sounds.  Abdominal:     General: Bowel sounds are normal. There is no distension.     Palpations: Abdomen is soft.     Tenderness: There is no abdominal tenderness. There is no guarding or rebound.  Musculoskeletal:     Right lower leg: No edema.     Left lower leg: No edema.  Skin:    General: Skin is warm and dry.  Neurological:     Mental Status: She is alert.     Assessment/Plan: Please see individual problem list.  Problem List Items Addressed This Visit     Patient travels    The patient is in excellent health.  She is able to make this trip from my perspective.  Her form was filled out.        Return for in december for yearly exam.  This visit occurred during the SARS-CoV-2 public health emergency.  Safety protocols were in place, including screening questions prior to  the visit, additional usage of staff PPE, and extensive cleaning of exam room while observing appropriate contact time as indicated for disinfecting solutions.    Tommi Rumps, MD [place]

## 2020-12-27 NOTE — Patient Instructions (Signed)
Nice to meet you. Please enjoy your trip to [place].

## 2020-12-27 NOTE — Assessment & Plan Note (Signed)
The patient is in excellent health.  She is able to make this trip from my perspective.  Her form was filled out.

## 2021-04-03 ENCOUNTER — Encounter: Payer: Medicare HMO | Admitting: Internal Medicine

## 2021-04-21 ENCOUNTER — Encounter: Payer: Self-pay | Admitting: Internal Medicine

## 2021-04-24 ENCOUNTER — Encounter: Payer: Self-pay | Admitting: Internal Medicine

## 2021-04-24 ENCOUNTER — Ambulatory Visit (INDEPENDENT_AMBULATORY_CARE_PROVIDER_SITE_OTHER): Payer: Medicare HMO | Admitting: Internal Medicine

## 2021-04-24 ENCOUNTER — Other Ambulatory Visit: Payer: Self-pay

## 2021-04-24 VITALS — BP 116/70 | HR 71 | Temp 96.7°F | Ht 64.0 in | Wt 132.0 lb

## 2021-04-24 DIAGNOSIS — Z1211 Encounter for screening for malignant neoplasm of colon: Secondary | ICD-10-CM

## 2021-04-24 DIAGNOSIS — Z0000 Encounter for general adult medical examination without abnormal findings: Secondary | ICD-10-CM

## 2021-04-24 DIAGNOSIS — Z1231 Encounter for screening mammogram for malignant neoplasm of breast: Secondary | ICD-10-CM | POA: Diagnosis not present

## 2021-04-24 DIAGNOSIS — M810 Age-related osteoporosis without current pathological fracture: Secondary | ICD-10-CM

## 2021-04-24 DIAGNOSIS — Z8249 Family history of ischemic heart disease and other diseases of the circulatory system: Secondary | ICD-10-CM

## 2021-04-24 DIAGNOSIS — D72819 Decreased white blood cell count, unspecified: Secondary | ICD-10-CM | POA: Diagnosis not present

## 2021-04-24 DIAGNOSIS — E785 Hyperlipidemia, unspecified: Secondary | ICD-10-CM

## 2021-04-24 DIAGNOSIS — E559 Vitamin D deficiency, unspecified: Secondary | ICD-10-CM | POA: Diagnosis not present

## 2021-04-24 DIAGNOSIS — E041 Nontoxic single thyroid nodule: Secondary | ICD-10-CM | POA: Diagnosis not present

## 2021-04-24 DIAGNOSIS — R195 Other fecal abnormalities: Secondary | ICD-10-CM

## 2021-04-24 DIAGNOSIS — Z1389 Encounter for screening for other disorder: Secondary | ICD-10-CM

## 2021-04-24 DIAGNOSIS — Z23 Encounter for immunization: Secondary | ICD-10-CM | POA: Diagnosis not present

## 2021-04-24 LAB — COMPREHENSIVE METABOLIC PANEL
ALT: 19 U/L (ref 0–35)
AST: 20 U/L (ref 0–37)
Albumin: 4.3 g/dL (ref 3.5–5.2)
Alkaline Phosphatase: 71 U/L (ref 39–117)
BUN: 12 mg/dL (ref 6–23)
CO2: 29 mEq/L (ref 19–32)
Calcium: 9.7 mg/dL (ref 8.4–10.5)
Chloride: 103 mEq/L (ref 96–112)
Creatinine, Ser: 0.8 mg/dL (ref 0.40–1.20)
GFR: 71.67 mL/min (ref >60.00)
Glucose, Bld: 97 mg/dL (ref 70–99)
Potassium: 3.8 mEq/L (ref 3.5–5.1)
Sodium: 138 mEq/L (ref 135–145)
Total Bilirubin: 0.9 mg/dL (ref 0.2–1.2)
Total Protein: 6.7 g/dL (ref 6.0–8.3)

## 2021-04-24 LAB — LIPID PANEL
Cholesterol: 269 mg/dL — ABNORMAL HIGH (ref 0–200)
HDL: 66 mg/dL (ref >39.00)
LDL Cholesterol: 171 mg/dL — ABNORMAL HIGH (ref 0–99)
NonHDL: 202.82
Total CHOL/HDL Ratio: 4
Triglycerides: 160 mg/dL — ABNORMAL HIGH (ref 0.0–149.0)
VLDL: 32 mg/dL (ref 0.0–40.0)

## 2021-04-24 LAB — VITAMIN D 25 HYDROXY (VIT D DEFICIENCY, FRACTURES): VITD: 42.12 ng/mL (ref 30.00–100.00)

## 2021-04-24 LAB — TSH: TSH: 1.42 u[IU]/mL (ref 0.35–5.50)

## 2021-04-24 LAB — CBC WITH DIFFERENTIAL/PLATELET
Basophils Absolute: 0 10*3/uL (ref 0.0–0.1)
Basophils Relative: 0.5 % (ref 0.0–3.0)
Eosinophils Absolute: 0.1 10*3/uL (ref 0.0–0.7)
Eosinophils Relative: 2.5 % (ref 0.0–5.0)
HCT: 41.1 % (ref 36.0–46.0)
Hemoglobin: 13.5 g/dL (ref 12.0–15.0)
Lymphocytes Relative: 33.4 % (ref 12.0–46.0)
Lymphs Abs: 1.3 10*3/uL (ref 0.7–4.0)
MCHC: 32.9 g/dL (ref 30.0–36.0)
MCV: 93.1 fl (ref 78.0–100.0)
Monocytes Absolute: 0.2 10*3/uL (ref 0.1–1.0)
Monocytes Relative: 6 % (ref 3.0–12.0)
Neutro Abs: 2.3 10*3/uL (ref 1.4–7.7)
Neutrophils Relative %: 57.6 % (ref 43.0–77.0)
Platelets: 208 10*3/uL (ref 150.0–400.0)
RBC: 4.42 Mil/uL (ref 3.87–5.11)
RDW: 13.2 % (ref 11.5–15.5)
WBC: 4 10*3/uL (ref 4.0–10.5)

## 2021-04-24 MED ORDER — TETANUS-DIPHTH-ACELL PERTUSSIS 5-2.5-18.5 LF-MCG/0.5 IM SUSP: 0.5000 mL | Freq: Once | INTRAMUSCULAR | 0 refills | Status: AC

## 2021-04-24 NOTE — Patient Instructions (Addendum)
Consider low back Xray, and both hips  Consider repeat thyroid ultrasound  Consider Dr. Harlow Asa surgeon for thyroid  Call and schedule mammogram when ready in [place] after 10/01/21

## 2021-04-24 NOTE — Progress Notes (Signed)
Chief Complaint  Patient presents with   Annual Exam   Annual  1. Thyroid nodule atypical cells bx 2019 pt wants to wait on repeat thyroid US  2. Left hip pain and low back pain thinks left hip misaligned f/u with pt massage Q90 days and chiropractor 3. Osteoporosis declines meds and [facility] endocrine referral for now    Review of Systems  Constitutional:  Negative for weight loss.  HENT:  Negative for hearing loss.   Eyes:  Negative for blurred vision.  Respiratory:  Negative for shortness of breath.   Cardiovascular:  Negative for chest pain.  Gastrointestinal:  Negative for abdominal pain and blood in stool.  Genitourinary:  Negative for dysuria.  Musculoskeletal:  Positive for back pain and joint pain. Negative for falls.  Skin:  Negative for rash.  Neurological:  Negative for headaches.  Psychiatric/Behavioral:  Negative for depression.   Past Medical History:  Diagnosis Date   COVID-19    11/08/20   Hyperlipidemia    Low serum vitamin D    Osteoporosis    Past Surgical History:  Procedure Laterality Date   FACIAL COSMETIC SURGERY     KNEE ARTHROSCOPY Right 01/2011   torn meniscus   ROOT CANAL Left    04/2021 surgery schedule   TRIGGER FINGER RELEASE Right 08/2014   TUBAL LIGATION     WRIST FRACTURE SURGERY     left   Family History  Problem Relation Age of Onset   Cancer Mother        breast cancer   Breast cancer Mother 17   Hypertension Mother    Hyperlipidemia Mother    Diabetes Father    Cancer Father        lymphoma    Heart disease Brother    Clotting disorder Brother        on Coumadin    Kidney disease Maternal Grandmother        died young age when her mother was 56 y.o    Cancer Maternal Grandfather    Heart attack Maternal Grandfather    Cancer Paternal Grandmother        ?breast cancer   Breast cancer Paternal Grandmother        PT think it was cancer; never got check    Other Paternal Grandfather        polio   CAD Son        x 2   Gout  Son    Social History   Socioeconomic History   Marital status: Single    Spouse name: Not on file   Number of children: 3   Years of education: Not on file   Highest education level: Doctorate  Occupational History    Employer: RETIRED  Tobacco Use   Smoking status: Never   Smokeless tobacco: Never   Tobacco comments:    smoking cessation materials not required  Vaping Use   Vaping Use: Never used  Substance and Sexual Activity   Alcohol use: Yes    Alcohol/week: 1.0 standard drink    Types: 1 Glasses of wine per week    Comment: rare   Drug use: No   Sexual activity: Not Currently  Other Topics Concern   Not on file  Social History Narrative   [employer] used to work    3 sons    DPR son Jeanmarie Hubert [phone]   7 grandchildren 1 boy and 6 girls    Used to horse  back ride    Likes exercise silver sneakers, massage 1 x per month, yoga walks 2-3 miles daily and does weight lifting    Never smoker    Social Determinants of Radio broadcast assistant Strain: Not on file  Food Insecurity: Not on file  Transportation Needs: Not on file  Physical Activity: Not on file  Stress: Not on file  Social Connections: Not on file  Intimate Partner Violence: Not on file   Current Meds  Medication Sig   CALCIUM PO Take 1,000 mg by mouth daily at 12 noon.   Cholecalciferol (VITAMIN D3 PO) Take 20 mcg by mouth daily at 12 noon.   Flaxseed Oil OIL Take 30 mLs by mouth daily at 12 noon. 2 tablespoons = 30 mL   magnesium oxide (MAG-OX) 400 MG tablet Take 400 mg by mouth daily.   Misc Natural Products (ELDERBERRY ZINC/VIT C/IMMUNE MT) Use as directed in the mouth or throat daily at 12 noon.   Multiple Vitamins-Minerals (CENTRUM SILVER PO) Take by mouth daily at 12 noon.   Tdap (BOOSTRIX) 5-2.5-18.5 LF-MCG/0.5 injection Inject 0.5 mLs into the muscle once for 1 dose.   Allergies  Allergen Reactions   Statins     Muscle aches    No results found for this or any previous  visit (from the past 2160 hour(s)). Objective  Body mass index is 22.66 kg/m. Wt Readings from Last 3 Encounters:  04/24/21 132 lb (59.9 kg)  12/27/20 130 lb 12.8 oz (59.3 kg)  04/02/20 129 lb 12.8 oz (58.9 kg)   Temp Readings from Last 3 Encounters:  04/24/21 (!) 96.7 F (35.9 C) (Temporal)  12/27/20 98.5 F (36.9 C) (Oral)  04/02/20 (!) 97.4 F (36.3 C) (Oral)   BP Readings from Last 3 Encounters:  04/24/21 116/70  12/27/20 118/78  04/02/20 122/72   Pulse Readings from Last 3 Encounters:  04/24/21 71  12/27/20 92  04/02/20 80    Physical Exam Vitals and nursing note reviewed.  Constitutional:      Appearance: Normal appearance. She is well-developed and well-groomed.  HENT:     Head: Normocephalic and atraumatic.  Eyes:     Conjunctiva/sclera: Conjunctivae normal.     Pupils: Pupils are equal, round, and reactive to light.  Cardiovascular:     Rate and Rhythm: Normal rate and regular rhythm.     Heart sounds: Normal heart sounds. No murmur heard. Pulmonary:     Effort: Pulmonary effort is normal.     Breath sounds: Normal breath sounds.  Chest:     Chest wall: No mass.  Breasts:    Breasts are symmetrical.     Right: Normal.     Left: Normal.  Abdominal:     General: Abdomen is flat. Bowel sounds are normal.     Tenderness: There is no abdominal tenderness.  Musculoskeletal:        General: No tenderness.  Lymphadenopathy:     Upper Body:     Right upper body: No axillary adenopathy.     Left upper body: No axillary adenopathy.  Skin:    General: Skin is warm and dry.  Neurological:     General: No focal deficit present.     Mental Status: She is alert and oriented to person, place, and time. Mental status is at baseline.     Cranial Nerves: Cranial nerves 2-12 are intact.     Gait: Gait is intact.  Psychiatric:  Attention and Perception: Attention and perception normal.        Mood and Affect: Mood and affect normal.        Speech: Speech  normal.        Behavior: Behavior normal. Behavior is cooperative.        Thought Content: Thought content normal.        Cognition and Memory: Cognition and memory normal.        Judgment: Judgment normal.    Assessment  Plan  Annual physical exam - Plan: Comprehensive metabolic panel, Lipid panel, CBC w/Diff, TSH, Urinalysis, Routine w reflex microscopic, Vitamin D (25 hydroxy), Pathologist smear review, Comprehensive metabolic panel, Lipid panel, TSH, CBC w/Diff, Urinalysis, Routine w reflex microscopic  Thyroid nodule - Plan: TSH, TSH Consider repeat thyroid US declines for now and consider Dr. Harlow Asa as bx in 2019 atypical cells [place] fna pt wants to wait for now See below   Leukopenia, unspecified type - Plan: CBC w/Diff, Pathologist smear review, CBC w/Diff  Hyperlipidemia, unspecified hyperlipidemia type - Plan: CT CARDIAC SCORING (SELF PAY ONLY), Lipid panel, Lipid panel FH son 2 vessel cad: CAD (coronary artery disease) - Plan: CT CARDIAC SCORING (SELF PAY ONLY)  Colon cancer screening - Plan: Cologuard  disc DNR today form filled out and also most form mailed to pt body will be donated to  [place] Flu shot utd  prevnar had  moderna 4/4 and 1 pfizer  pna 23 01/16/13 rec  shingrix utd  Tdap had 06/02/09 rx today    mammo 10/01/20 negative ordered  No pap out of age window   Colonoscopy per pt had in 2018 in [place] need to track down copy Dr. Allen Norris no copy rec contact [facility] as of 03/31/2019   Colonoscopy late 1990s had 2  ROI today cologuard sen ttoday   dexa 08/24/17 osteoporosis declines tx will check vit D rec D and calcium  +osteoporosis declines [facility] for now  Declines meds  Consider Xray b/l hips and low back in the future   Skin currently no issues  sch fasting labs today and 1 year   rec healthy diet and exercise Provider: Dr. Olivia Mackie McLean-Scocuzza-Internal Medicine

## 2021-04-25 ENCOUNTER — Telehealth: Payer: Self-pay | Admitting: *Deleted

## 2021-04-25 LAB — URINALYSIS, ROUTINE W REFLEX MICROSCOPIC
Bacteria, UA: NONE SEEN /HPF
Bilirubin Urine: NEGATIVE
Glucose, UA: NEGATIVE
Hgb urine dipstick: NEGATIVE
Hyaline Cast: NONE SEEN /LPF
Ketones, ur: NEGATIVE
Nitrite: NEGATIVE
Protein, ur: NEGATIVE
RBC / HPF: NONE SEEN /HPF (ref 0–2)
Specific Gravity, Urine: 1.006 (ref 1.001–1.035)
Squamous Epithelial / HPF: NONE SEEN /HPF (ref <=5)
WBC, UA: NONE SEEN /HPF (ref 0–5)
pH: 7 (ref 5.0–8.0)

## 2021-04-25 LAB — MICROSCOPIC MESSAGE

## 2021-04-25 LAB — PATHOLOGIST SMEAR REVIEW

## 2021-04-25 NOTE — Telephone Encounter (Signed)
-----   Message from Delorise Jackson, MD sent at 04/25/2021 12:52 PM EST ----- Vitamin D normal  Thyroid lab normal  Liver kidneys normal Cholesterol elevated  -will do CT cardiac score  -does she want to try statin cholesterol medication based on CT results of heart?  Consider taking aspirin 81 mg daily otc Blood cts normal  Urine normal

## 2021-05-05 DIAGNOSIS — Z1211 Encounter for screening for malignant neoplasm of colon: Secondary | ICD-10-CM | POA: Diagnosis not present

## 2021-05-11 LAB — COLOGUARD: COLOGUARD: POSITIVE — AB

## 2021-05-12 ENCOUNTER — Other Ambulatory Visit: Payer: Self-pay

## 2021-05-12 ENCOUNTER — Encounter: Payer: Self-pay | Admitting: Internal Medicine

## 2021-05-12 DIAGNOSIS — R195 Other fecal abnormalities: Secondary | ICD-10-CM

## 2021-05-12 DIAGNOSIS — H25011 Cortical age-related cataract, right eye: Secondary | ICD-10-CM | POA: Diagnosis not present

## 2021-05-12 DIAGNOSIS — H524 Presbyopia: Secondary | ICD-10-CM | POA: Diagnosis not present

## 2021-05-12 NOTE — Addendum Note (Signed)
Addended by: Thressa Sheller on: 05/12/2021 02:09 PM   Modules accepted: Orders

## 2021-05-12 NOTE — Addendum Note (Signed)
Addended by: Orland Mustard on: 05/12/2021 08:05 PM   Modules accepted: Orders

## 2021-05-13 ENCOUNTER — Other Ambulatory Visit: Payer: Self-pay

## 2021-05-13 ENCOUNTER — Telehealth: Payer: Self-pay

## 2021-05-13 DIAGNOSIS — Z1211 Encounter for screening for malignant neoplasm of colon: Secondary | ICD-10-CM

## 2021-05-13 MED ORDER — CLENPIQ 10-3.5-12 MG-GM -GM/160ML PO SOLN: 320.0000 mL | ORAL | 0 refills | Status: DC

## 2021-05-13 NOTE — Telephone Encounter (Signed)
Gastroenterology Pre-Procedure Review  Request Date: 05/20/21 Requesting Physician: Dr. Allen Norris  PATIENT REVIEW QUESTIONS: The patient responded to the following health history questions as indicated:    1. Are you having any GI issues? no 2. Do you have a personal history of Polyps? no 3. Do you have a family history of Colon Cancer or Polyps? no 4. Diabetes Mellitus? no 5. Joint replacements in the past 12 months?no 6. Major health problems in the past 3 months?no 7. Any artificial heart valves, MVP, or defibrillator?no    MEDICATIONS & ALLERGIES:    Patient reports the following regarding taking any anticoagulation/antiplatelet therapy:   Plavix, Coumadin, Eliquis, Xarelto, Lovenox, Pradaxa, Brilinta, or Effient? no Aspirin? yes (ASA 81mg )  Patient confirms/reports the following medications:  Current Outpatient Medications  Medication Sig Dispense Refill   CALCIUM PO Take 1,000 mg by mouth daily at 12 noon.     Cholecalciferol (VITAMIN D3 PO) Take 20 mcg by mouth daily at 12 noon.     Flaxseed Oil OIL Take 30 mLs by mouth daily at 12 noon. 2 tablespoons = 30 mL     ipratropium (ATROVENT) 0.03 % nasal spray Place 2 sprays into both nostrils every 12 (twelve) hours.     magnesium oxide (MAG-OX) 400 MG tablet Take 400 mg by mouth daily.     Misc Natural Products (ELDERBERRY ZINC/VIT C/IMMUNE MT) Use as directed in the mouth or throat daily at 12 noon.     Multiple Vitamins-Minerals (CENTRUM SILVER PO) Take by mouth daily at 12 noon.     No current facility-administered medications for this visit.    Patient confirms/reports the following allergies:  Allergies  Allergen Reactions   Statins     Muscle aches     No orders of the defined types were placed in this encounter.   AUTHORIZATION INFORMATION Primary Insurance: 1D#: Group #:  Secondary Insurance: 1D#: Group #:  SCHEDULE INFORMATION: Date:  Time: Location:

## 2021-05-16 ENCOUNTER — Other Ambulatory Visit: Payer: Self-pay

## 2021-05-16 ENCOUNTER — Ambulatory Visit
Admission: RE | Admit: 2021-05-16 | Discharge: 2021-05-16 | Disposition: A | Discharge status: Home / Self Care | Payer: Medicare HMO | Source: Ambulatory Visit | Attending: Internal Medicine | Admitting: Internal Medicine

## 2021-05-16 DIAGNOSIS — E785 Hyperlipidemia, unspecified: Secondary | ICD-10-CM | POA: Insufficient documentation

## 2021-05-16 DIAGNOSIS — Z8249 Family history of ischemic heart disease and other diseases of the circulatory system: Secondary | ICD-10-CM | POA: Insufficient documentation

## 2021-05-20 ENCOUNTER — Ambulatory Visit: Payer: Medicare HMO | Admitting: Certified Registered Nurse Anesthetist

## 2021-05-20 ENCOUNTER — Ambulatory Visit
Admission: RE | Admit: 2021-05-20 | Discharge: 2021-05-20 | Disposition: A | Discharge status: Home / Self Care | Payer: Medicare HMO | Attending: Gastroenterology | Admitting: Gastroenterology

## 2021-05-20 ENCOUNTER — Encounter
Admission: RE | Disposition: A | Discharge status: Home / Self Care | Payer: Self-pay | Source: Home / Self Care | Attending: Gastroenterology

## 2021-05-20 ENCOUNTER — Encounter: Payer: Self-pay | Admitting: Gastroenterology

## 2021-05-20 DIAGNOSIS — D124 Benign neoplasm of descending colon: Secondary | ICD-10-CM | POA: Diagnosis not present

## 2021-05-20 DIAGNOSIS — Z860100 Personal history of colon polyps, unspecified: Secondary | ICD-10-CM

## 2021-05-20 DIAGNOSIS — Z8616 Personal history of COVID-19: Secondary | ICD-10-CM | POA: Insufficient documentation

## 2021-05-20 DIAGNOSIS — E785 Hyperlipidemia, unspecified: Secondary | ICD-10-CM | POA: Insufficient documentation

## 2021-05-20 DIAGNOSIS — K635 Polyp of colon: Secondary | ICD-10-CM | POA: Diagnosis not present

## 2021-05-20 DIAGNOSIS — Z1211 Encounter for screening for malignant neoplasm of colon: Secondary | ICD-10-CM | POA: Diagnosis not present

## 2021-05-20 DIAGNOSIS — K648 Other hemorrhoids: Secondary | ICD-10-CM | POA: Diagnosis not present

## 2021-05-20 DIAGNOSIS — D125 Benign neoplasm of sigmoid colon: Secondary | ICD-10-CM | POA: Diagnosis not present

## 2021-05-20 DIAGNOSIS — D122 Benign neoplasm of ascending colon: Secondary | ICD-10-CM | POA: Insufficient documentation

## 2021-05-20 HISTORY — PX: COLONOSCOPY WITH PROPOFOL: SHX5780

## 2021-05-20 SURGERY — COLONOSCOPY WITH PROPOFOL
Anesthesia: General

## 2021-05-20 MED ORDER — LIDOCAINE HCL (CARDIAC) PF 100 MG/5ML IV SOSY
PREFILLED_SYRINGE | INTRAVENOUS | Status: DC | PRN
  Administered 2021-05-20: 40 mg via INTRAVENOUS

## 2021-05-20 MED ORDER — PROPOFOL 500 MG/50ML IV EMUL
INTRAVENOUS | Status: DC | PRN
  Administered 2021-05-20: 150 ug/kg/min via INTRAVENOUS

## 2021-05-20 MED ORDER — PROPOFOL 10 MG/ML IV BOLUS
INTRAVENOUS | Status: DC | PRN
  Administered 2021-05-20 (×2): 50 mg via INTRAVENOUS

## 2021-05-20 MED ORDER — SODIUM CHLORIDE 0.9 % IV SOLN
INTRAVENOUS | Status: DC
  Administered 2021-05-20: 11:00:00 1000 mL via INTRAVENOUS

## 2021-05-20 NOTE — Op Note (Signed)
[facility] Gastroenterology Patient Name: Stacey Munoz Procedure Date: 05/20/2021 11:17 AM MRN: [id number] Account #: [id number] Date of Birth: Dec 08, 1945 Admit Type: Outpatient Age: 76 Room: [facility] ENDO ROOM 4 Gender: Female Note Status: Finalized Instrument Name: Jasper Riling [id number] Procedure:             Colonoscopy Indications:           High risk colon cancer surveillance: Personal history                         of colonic polyps Providers:             Lucilla Lame MD, MD Medicines:             Propofol per Anesthesia Complications:         No immediate complications. Procedure:             Pre-Anesthesia Assessment:                        - Prior to the procedure, a History and Physical was                         performed, and patient medications and allergies were                         reviewed. The patient's tolerance of previous                         anesthesia was also reviewed. The risks and benefits                         of the procedure and the sedation options and risks                         were discussed with the patient. All questions were                         answered, and informed consent was obtained. Prior                         Anticoagulants: The patient has taken no previous                         anticoagulant or antiplatelet agents. ASA Grade                         Assessment: II - A patient with mild systemic disease.                         After reviewing the risks and benefits, the patient                         was deemed in satisfactory condition to undergo the                         procedure.                        After obtaining informed consent, the colonoscope was  passed under direct vision. Throughout the procedure,                         the patient's blood pressure, pulse, and oxygen                         saturations were monitored continuously. The                          Colonoscope was introduced through the anus and                         advanced to the the cecum, identified by appendiceal                         orifice and ileocecal valve. The colonoscopy was                         performed without difficulty. The patient tolerated                         the procedure well. The quality of the bowel                         preparation was excellent. Findings:      The perianal and digital rectal examinations were normal.      Two sessile polyps were found in the ascending colon. The polyps were 1       to 9 mm in size. These polyps were removed with a cold snare. Resection       and retrieval were complete.      A 4 mm polyp was found in the descending colon. The polyp was sessile.       The polyp was removed with a cold snare. Resection and retrieval were       complete.      A 4 mm polyp was found in the sigmoid colon. The polyp was sessile. The       polyp was removed with a cold snare. Resection and retrieval were       complete.      Non-bleeding internal hemorrhoids were found during retroflexion. The       hemorrhoids were Grade II (internal hemorrhoids that prolapse but reduce       spontaneously). Impression:            - Two 1 to 9 mm polyps in the ascending colon, removed                         with a cold snare. Resected and retrieved.                        - One 4 mm polyp in the descending colon, removed with                         a cold snare. Resected and retrieved.                        - One 4 mm polyp in the sigmoid colon, removed with a  cold snare. Resected and retrieved.                        - Non-bleeding internal hemorrhoids. Recommendation:        - Discharge patient to home.                        - Resume previous diet.                        - Continue present medications.                        - Await pathology results.                        - Repeat colonoscopy is not recommended for                          surveillance. Procedure Code(s):     --- Professional ---                        [phone], Colonoscopy, flexible; with removal of                         tumor(s), polyp(s), or other lesion(s) by snare                         technique Diagnosis Code(s):     --- Professional ---                        Z86.010, Personal history of colonic polyps                        K63.5, Polyp of colon CPT copyright 2019 American Medical Association. All rights reserved. The codes documented in this report are preliminary and upon coder review may  be revised to meet current compliance requirements. Lucilla Lame MD, MD 05/20/2021 11:44:17 AM This report has been signed electronically. Number of Addenda: 0 Note Initiated On: 05/20/2021 11:17 AM Scope Withdrawal Time: 0 hours 8 minutes 45 seconds  Total Procedure Duration: 0 hours 12 minutes 1 second  Estimated Blood Loss:  Estimated blood loss: none.      [facility]

## 2021-05-20 NOTE — Transfer of Care (Signed)
Immediate Anesthesia Transfer of Care Note  Patient: Stacey Munoz  Procedure(s) Performed: COLONOSCOPY WITH PROPOFOL  Patient Location: PACU and Endoscopy Unit  Anesthesia Type:General  Level of Consciousness: awake, drowsy and patient cooperative  Airway & Oxygen Therapy: Patient Spontanous Breathing  Post-op Assessment: Report given to RN and Post -op Vital signs reviewed and stable  Post vital signs: Reviewed and stable  Last Vitals:  Vitals Value Taken Time  BP 103/66 05/20/21 1146  Temp 35.8 C 05/20/21 1146  Pulse 82 05/20/21 1146  Resp 19 05/20/21 1146  SpO2 96 % 05/20/21 1146    Last Pain:  Vitals:   05/20/21 1146  TempSrc: Temporal  PainSc: Asleep         Complications: No notable events documented.

## 2021-05-20 NOTE — Anesthesia Postprocedure Evaluation (Signed)
Anesthesia Post Note  Patient: Stacey Munoz  Procedure(s) Performed: COLONOSCOPY WITH PROPOFOL  Patient location during evaluation: Endoscopy Anesthesia Type: General Level of consciousness: awake and alert Pain management: pain level controlled Vital Signs Assessment: post-procedure vital signs reviewed and stable Respiratory status: spontaneous breathing, nonlabored ventilation, respiratory function stable and patient connected to nasal cannula oxygen Cardiovascular status: blood pressure returned to baseline and stable Postop Assessment: no apparent nausea or vomiting Anesthetic complications: no   No notable events documented.   Last Vitals:  Vitals:   05/20/21 1156 05/20/21 1206  BP: 112/67 (!) 126/95  Pulse: 82 71  Resp: 17 20  Temp:    SpO2: 97% 100%    Last Pain:  Vitals:   05/20/21 1206  TempSrc:   PainSc: 0-No pain                 Arita Miss

## 2021-05-20 NOTE — H&P (Signed)
Lucilla Lame, MD [address] [place], [place] [address] [phone] Fax : [phone]  Primary Care Physician:  McLean-Scocuzza, Nino Glow, MD Primary Gastroenterologist:  Dr. Allen Norris  Pre-Procedure History & Physical: HPI:  Stacey Munoz is a 76 y.o. female is here for an colonoscopy.   Past Medical History:  Diagnosis Date   COVID-19    11/08/20   Hyperlipidemia    Low serum vitamin D    Osteoporosis     Past Surgical History:  Procedure Laterality Date   FACIAL COSMETIC SURGERY     FRACTURE SURGERY     KNEE ARTHROSCOPY Right 01/2011   torn meniscus   ROOT CANAL Left    04/2021 surgery schedule   TRIGGER FINGER RELEASE Right 08/2014   TUBAL LIGATION     WRIST FRACTURE SURGERY     left    Prior to Admission medications   Medication Sig Start Date End Date Taking? Authorizing Provider  CALCIUM PO Take 1,000 mg by mouth daily at 12 noon.   Yes [provider]  Cholecalciferol (VITAMIN D3 PO) Take 20 mcg by mouth daily at 12 noon.   Yes [provider]  Flaxseed Oil OIL Take 30 mLs by mouth daily at 12 noon. 2 tablespoons = 30 mL   Yes [provider]  ipratropium (ATROVENT) 0.03 % nasal spray Place 2 sprays into both nostrils every 12 (twelve) hours.   Yes [provider]  magnesium oxide (MAG-OX) 400 MG tablet Take 400 mg by mouth daily.   Yes [provider]  Misc Natural Products (ELDERBERRY ZINC/VIT C/IMMUNE MT) Use as directed in the mouth or throat daily at 12 noon.   Yes [provider]  Multiple Vitamins-Minerals (CENTRUM SILVER PO) Take by mouth daily at 12 noon.   Yes [provider]  Sod Picosulfate-Mag Ox-Cit Acd (CLENPIQ) 10-3.5-12 MG-GM -GM/160ML SOLN Take 320 mLs by mouth as directed. 05/13/21  Yes Lucilla Lame, MD    Allergies as of 05/13/2021 - Review Complete 04/24/2021  Allergen Reaction Noted   Statins  03/22/2019    Family History  Problem Relation Age of Onset    Cancer Mother        breast cancer   Breast cancer Mother 15   Hypertension Mother    Hyperlipidemia Mother    Diabetes Father    Cancer Father        lymphoma    Heart disease Brother    Clotting disorder Brother        on Coumadin    Kidney disease Maternal Grandmother        died young age when her mother was 35 y.o    Cancer Maternal Grandfather    Heart attack Maternal Grandfather    Cancer Paternal Grandmother        ?breast cancer   Breast cancer Paternal Grandmother        PT think it was cancer; never got check    Other Paternal Grandfather        polio   CAD Son        x 2   Gout Son     Social History   Socioeconomic History   Marital status: Single    Spouse name: Not on file   Number of children: 3   Years of education: Not on file   Highest education level: Doctorate  Occupational History    Employer: RETIRED  Tobacco Use   Smoking status: Never  Smokeless tobacco: Never   Tobacco comments:    smoking cessation materials not required  Vaping Use   Vaping Use: Never used  Substance and Sexual Activity   Alcohol use: Yes    Alcohol/week: 1.0 standard drink    Types: 1 Glasses of wine per week    Comment: rare   Drug use: No   Sexual activity: Not Currently  Other Topics Concern   Not on file  Social History Narrative   [employer] used to work    3 sons    DPR son Jeanmarie Hubert [phone]   7 grandchildren 1 boy and 6 girls    Used to horse back ride    Likes exercise silver sneakers, massage 1 x per month, yoga walks 2-3 miles daily and does weight lifting    Never smoker    Social Determinants of Radio broadcast assistant Strain: Not on file  Food Insecurity: Not on file  Transportation Needs: Not on file  Physical Activity: Not on file  Stress: Not on file  Social Connections: Not on file  Intimate Partner Violence: Not on file    Review of Systems: See HPI, otherwise negative ROS  Physical Exam: BP (!) 149/72    Pulse  75    Temp (!) 97 F (36.1 C) (Temporal)    Resp 16    Ht 5\' 4"  (1.626 m)    Wt 58.2 kg    SpO2 99%    BMI 22.02 kg/m  General:   Alert,  pleasant and cooperative in NAD Head:  Normocephalic and atraumatic. Neck:  Supple; no masses or thyromegaly. Lungs:  Clear throughout to auscultation.    Heart:  Regular rate and rhythm. Abdomen:  Soft, nontender and nondistended. Normal bowel sounds, without guarding, and without rebound.   Neurologic:  Alert and  oriented x4;  grossly normal neurologically.  Impression/Plan: Stacey Munoz is here for an colonoscopy to be performed for a history of adenomatous polyps on 2015  Risks, benefits, limitations, and alternatives regarding  colonoscopy have been reviewed with the patient.  Questions have been answered.  All parties agreeable.   Lucilla Lame, MD  05/20/2021, 11:23 AM

## 2021-05-20 NOTE — Anesthesia Preprocedure Evaluation (Addendum)
Anesthesia Evaluation  Patient identified by MRN, date of birth, ID band Patient awake    Reviewed: Allergy & Precautions, NPO status , Patient's Chart, lab work & pertinent test results  History of Anesthesia Complications Negative for: history of anesthetic complications  Airway Mallampati: II  TM Distance: >3 FB Neck ROM: Full    Dental  (+) Teeth Intact, Implants   Pulmonary neg pulmonary ROS, neg sleep apnea, neg COPD, Patient abstained from smoking.Not current smoker,    Pulmonary exam normal breath sounds clear to auscultation       Cardiovascular Exercise Tolerance: Good METS(-) hypertension(-) CAD and (-) Past MI negative cardio ROS  (-) dysrhythmias  Rhythm:Regular Rate:Normal - Systolic murmurs    Neuro/Psych  Neuromuscular disease negative psych ROS   GI/Hepatic neg GERD  ,(+)     (-) substance abuse  ,   Endo/Other  neg diabetes  Renal/GU negative Renal ROS     Musculoskeletal  (+) Arthritis ,   Abdominal   Peds  Hematology   Anesthesia Other Findings Past Medical History: No date: COVID-19     Comment:  11/08/20 No date: Hyperlipidemia No date: Low serum vitamin D No date: Osteoporosis  Reproductive/Obstetrics                             Anesthesia Physical Anesthesia Plan  ASA: 2  Anesthesia Plan: General   Post-op Pain Management: Minimal or no pain anticipated   Induction: Intravenous  PONV Risk Score and Plan: 3 and Propofol infusion, TIVA and Ondansetron  Airway Management Planned: Nasal Cannula  Additional Equipment: None  Intra-op Plan:   Post-operative Plan:   Informed Consent: I have reviewed the patients History and Physical, chart, labs and discussed the procedure including the risks, benefits and alternatives for the proposed anesthesia with the patient or authorized representative who has indicated his/her understanding and acceptance.    Patient has DNR.  Discussed DNR with patient and Suspend DNR.   Dental advisory given  Plan Discussed with: CRNA and Surgeon  Anesthesia Plan Comments: (Discussed risks of anesthesia with patient, including possibility of difficulty with spontaneous ventilation under anesthesia necessitating airway intervention, PONV, and rare risks such as cardiac or respiratory or neurological events, and allergic reactions. Discussed the role of CRNA in patient's perioperative care. Patient understands.  Discussed extensively patient's DNR. Patient made it clear that she would not want chest compressions nor prolonged intubation. She is OK with resuscitative efforts deemed in our judgement to be quick or straightforward.)       Anesthesia Quick Evaluation

## 2021-05-21 ENCOUNTER — Encounter: Payer: Self-pay | Admitting: Gastroenterology

## 2021-05-21 LAB — SURGICAL PATHOLOGY

## 2021-05-22 ENCOUNTER — Encounter: Payer: Self-pay | Admitting: Gastroenterology

## 2021-06-23 DIAGNOSIS — M9903 Segmental and somatic dysfunction of lumbar region: Secondary | ICD-10-CM | POA: Diagnosis not present

## 2021-06-23 DIAGNOSIS — M5416 Radiculopathy, lumbar region: Secondary | ICD-10-CM | POA: Diagnosis not present

## 2021-06-23 DIAGNOSIS — M5136 Other intervertebral disc degeneration, lumbar region: Secondary | ICD-10-CM | POA: Diagnosis not present

## 2021-06-23 DIAGNOSIS — M62830 Muscle spasm of back: Secondary | ICD-10-CM | POA: Diagnosis not present

## 2021-06-25 DIAGNOSIS — M62830 Muscle spasm of back: Secondary | ICD-10-CM | POA: Diagnosis not present

## 2021-06-25 DIAGNOSIS — M5136 Other intervertebral disc degeneration, lumbar region: Secondary | ICD-10-CM | POA: Diagnosis not present

## 2021-06-25 DIAGNOSIS — M9903 Segmental and somatic dysfunction of lumbar region: Secondary | ICD-10-CM | POA: Diagnosis not present

## 2021-06-25 DIAGNOSIS — M5416 Radiculopathy, lumbar region: Secondary | ICD-10-CM | POA: Diagnosis not present

## 2021-06-27 DIAGNOSIS — M62830 Muscle spasm of back: Secondary | ICD-10-CM | POA: Diagnosis not present

## 2021-06-27 DIAGNOSIS — M5416 Radiculopathy, lumbar region: Secondary | ICD-10-CM | POA: Diagnosis not present

## 2021-06-27 DIAGNOSIS — M9903 Segmental and somatic dysfunction of lumbar region: Secondary | ICD-10-CM | POA: Diagnosis not present

## 2021-06-27 DIAGNOSIS — M5136 Other intervertebral disc degeneration, lumbar region: Secondary | ICD-10-CM | POA: Diagnosis not present

## 2021-06-30 DIAGNOSIS — M5136 Other intervertebral disc degeneration, lumbar region: Secondary | ICD-10-CM | POA: Diagnosis not present

## 2021-06-30 DIAGNOSIS — M5416 Radiculopathy, lumbar region: Secondary | ICD-10-CM | POA: Diagnosis not present

## 2021-06-30 DIAGNOSIS — M9903 Segmental and somatic dysfunction of lumbar region: Secondary | ICD-10-CM | POA: Diagnosis not present

## 2021-06-30 DIAGNOSIS — M62830 Muscle spasm of back: Secondary | ICD-10-CM | POA: Diagnosis not present

## 2021-07-02 DIAGNOSIS — M5416 Radiculopathy, lumbar region: Secondary | ICD-10-CM | POA: Diagnosis not present

## 2021-07-02 DIAGNOSIS — M62830 Muscle spasm of back: Secondary | ICD-10-CM | POA: Diagnosis not present

## 2021-07-02 DIAGNOSIS — M9903 Segmental and somatic dysfunction of lumbar region: Secondary | ICD-10-CM | POA: Diagnosis not present

## 2021-07-02 DIAGNOSIS — M5136 Other intervertebral disc degeneration, lumbar region: Secondary | ICD-10-CM | POA: Diagnosis not present

## 2021-07-04 DIAGNOSIS — M9903 Segmental and somatic dysfunction of lumbar region: Secondary | ICD-10-CM | POA: Diagnosis not present

## 2021-07-04 DIAGNOSIS — M5416 Radiculopathy, lumbar region: Secondary | ICD-10-CM | POA: Diagnosis not present

## 2021-07-04 DIAGNOSIS — M5136 Other intervertebral disc degeneration, lumbar region: Secondary | ICD-10-CM | POA: Diagnosis not present

## 2021-07-04 DIAGNOSIS — M62830 Muscle spasm of back: Secondary | ICD-10-CM | POA: Diagnosis not present

## 2021-07-07 DIAGNOSIS — M9903 Segmental and somatic dysfunction of lumbar region: Secondary | ICD-10-CM | POA: Diagnosis not present

## 2021-07-07 DIAGNOSIS — M5416 Radiculopathy, lumbar region: Secondary | ICD-10-CM | POA: Diagnosis not present

## 2021-07-07 DIAGNOSIS — M62830 Muscle spasm of back: Secondary | ICD-10-CM | POA: Diagnosis not present

## 2021-07-07 DIAGNOSIS — M5136 Other intervertebral disc degeneration, lumbar region: Secondary | ICD-10-CM | POA: Diagnosis not present

## 2021-07-14 DIAGNOSIS — M5416 Radiculopathy, lumbar region: Secondary | ICD-10-CM | POA: Diagnosis not present

## 2021-07-14 DIAGNOSIS — M5136 Other intervertebral disc degeneration, lumbar region: Secondary | ICD-10-CM | POA: Diagnosis not present

## 2021-07-14 DIAGNOSIS — M9903 Segmental and somatic dysfunction of lumbar region: Secondary | ICD-10-CM | POA: Diagnosis not present

## 2021-07-14 DIAGNOSIS — M62830 Muscle spasm of back: Secondary | ICD-10-CM | POA: Diagnosis not present

## 2021-07-22 DIAGNOSIS — M5136 Other intervertebral disc degeneration, lumbar region: Secondary | ICD-10-CM | POA: Diagnosis not present

## 2021-07-22 DIAGNOSIS — M9903 Segmental and somatic dysfunction of lumbar region: Secondary | ICD-10-CM | POA: Diagnosis not present

## 2021-07-22 DIAGNOSIS — M62830 Muscle spasm of back: Secondary | ICD-10-CM | POA: Diagnosis not present

## 2021-07-22 DIAGNOSIS — M5416 Radiculopathy, lumbar region: Secondary | ICD-10-CM | POA: Diagnosis not present

## 2021-08-05 DIAGNOSIS — M62830 Muscle spasm of back: Secondary | ICD-10-CM | POA: Diagnosis not present

## 2021-08-05 DIAGNOSIS — M9903 Segmental and somatic dysfunction of lumbar region: Secondary | ICD-10-CM | POA: Diagnosis not present

## 2021-08-05 DIAGNOSIS — M5136 Other intervertebral disc degeneration, lumbar region: Secondary | ICD-10-CM | POA: Diagnosis not present

## 2021-08-05 DIAGNOSIS — M5416 Radiculopathy, lumbar region: Secondary | ICD-10-CM | POA: Diagnosis not present

## 2021-08-19 DIAGNOSIS — M5416 Radiculopathy, lumbar region: Secondary | ICD-10-CM | POA: Diagnosis not present

## 2021-08-19 DIAGNOSIS — M62830 Muscle spasm of back: Secondary | ICD-10-CM | POA: Diagnosis not present

## 2021-08-19 DIAGNOSIS — M9903 Segmental and somatic dysfunction of lumbar region: Secondary | ICD-10-CM | POA: Diagnosis not present

## 2021-08-19 DIAGNOSIS — M5136 Other intervertebral disc degeneration, lumbar region: Secondary | ICD-10-CM | POA: Diagnosis not present

## 2021-09-02 ENCOUNTER — Encounter: Payer: Self-pay | Admitting: Ophthalmology

## 2021-09-02 ENCOUNTER — Other Ambulatory Visit: Payer: Self-pay

## 2021-09-02 DIAGNOSIS — M5416 Radiculopathy, lumbar region: Secondary | ICD-10-CM | POA: Diagnosis not present

## 2021-09-02 DIAGNOSIS — H2511 Age-related nuclear cataract, right eye: Secondary | ICD-10-CM | POA: Diagnosis not present

## 2021-09-02 DIAGNOSIS — M5136 Other intervertebral disc degeneration, lumbar region: Secondary | ICD-10-CM | POA: Diagnosis not present

## 2021-09-02 DIAGNOSIS — M62830 Muscle spasm of back: Secondary | ICD-10-CM | POA: Diagnosis not present

## 2021-09-02 DIAGNOSIS — M9903 Segmental and somatic dysfunction of lumbar region: Secondary | ICD-10-CM | POA: Diagnosis not present

## 2021-09-15 DIAGNOSIS — M62830 Muscle spasm of back: Secondary | ICD-10-CM | POA: Diagnosis not present

## 2021-09-15 DIAGNOSIS — M9903 Segmental and somatic dysfunction of lumbar region: Secondary | ICD-10-CM | POA: Diagnosis not present

## 2021-09-15 DIAGNOSIS — M5416 Radiculopathy, lumbar region: Secondary | ICD-10-CM | POA: Diagnosis not present

## 2021-09-15 DIAGNOSIS — M5136 Other intervertebral disc degeneration, lumbar region: Secondary | ICD-10-CM | POA: Diagnosis not present

## 2021-09-15 NOTE — Discharge Instructions (Signed)

## 2021-09-16 ENCOUNTER — Ambulatory Visit
Admission: RE | Admit: 2021-09-16 | Discharge: 2021-09-16 | Disposition: A | Discharge status: Home / Self Care | Payer: Medicare HMO | Attending: Ophthalmology | Admitting: Ophthalmology

## 2021-09-16 ENCOUNTER — Ambulatory Visit: Payer: Medicare HMO | Admitting: Anesthesiology

## 2021-09-16 ENCOUNTER — Encounter: Payer: Self-pay | Admitting: Ophthalmology

## 2021-09-16 ENCOUNTER — Encounter
Admission: RE | Disposition: A | Discharge status: Home / Self Care | Payer: Self-pay | Source: Home / Self Care | Attending: Ophthalmology

## 2021-09-16 ENCOUNTER — Other Ambulatory Visit: Payer: Self-pay

## 2021-09-16 DIAGNOSIS — Z8616 Personal history of COVID-19: Secondary | ICD-10-CM | POA: Diagnosis not present

## 2021-09-16 DIAGNOSIS — H2511 Age-related nuclear cataract, right eye: Secondary | ICD-10-CM | POA: Insufficient documentation

## 2021-09-16 DIAGNOSIS — H25811 Combined forms of age-related cataract, right eye: Secondary | ICD-10-CM | POA: Diagnosis not present

## 2021-09-16 HISTORY — PX: CATARACT EXTRACTION W/PHACO: SHX586

## 2021-09-16 SURGERY — PHACOEMULSIFICATION, CATARACT, WITH IOL INSERTION
Anesthesia: Monitor Anesthesia Care | Site: Eye | Laterality: Right

## 2021-09-16 MED ORDER — BRIMONIDINE TARTRATE-TIMOLOL 0.2-0.5 % OP SOLN
OPHTHALMIC | Status: DC | PRN
  Administered 2021-09-16: 1 [drp] via OPHTHALMIC

## 2021-09-16 MED ORDER — FENTANYL CITRATE (PF) 100 MCG/2ML IJ SOLN
INTRAMUSCULAR | Status: DC | PRN
  Administered 2021-09-16 (×2): 50 ug via INTRAVENOUS

## 2021-09-16 MED ORDER — MIDAZOLAM HCL 2 MG/2ML IJ SOLN
INTRAMUSCULAR | Status: DC | PRN
  Administered 2021-09-16: 2 mg via INTRAVENOUS

## 2021-09-16 MED ORDER — ONDANSETRON HCL 4 MG/2ML IJ SOLN
4.0000 mg | Freq: Once | INTRAMUSCULAR | Status: AC | PRN
  Administered 2021-09-16: 4 mg via INTRAVENOUS

## 2021-09-16 MED ORDER — ACETAMINOPHEN 500 MG PO TABS: 1000.0000 mg | ORAL_TABLET | Freq: Once | ORAL | Status: DC | PRN

## 2021-09-16 MED ORDER — SIGHTPATH DOSE#1 BSS IO SOLN
INTRAOCULAR | Status: DC | PRN
  Administered 2021-09-16: 49 mL via OPHTHALMIC

## 2021-09-16 MED ORDER — SIGHTPATH DOSE#1 BSS IO SOLN
INTRAOCULAR | Status: DC | PRN
Start: 2021-09-16 — End: 2021-09-16
  Administered 2021-09-16: 15 mL via INTRAOCULAR

## 2021-09-16 MED ORDER — ARMC OPHTHALMIC DILATING DROPS
1.0000 "application " | OPHTHALMIC | Status: DC | PRN
  Administered 2021-09-16 (×3): 1 via OPHTHALMIC

## 2021-09-16 MED ORDER — TETRACAINE HCL 0.5 % OP SOLN
1.0000 [drp] | OPHTHALMIC | Status: DC | PRN
  Administered 2021-09-16 (×3): 1 [drp] via OPHTHALMIC

## 2021-09-16 MED ORDER — LACTATED RINGERS IV SOLN: INTRAVENOUS | Status: DC

## 2021-09-16 MED ORDER — ACETAMINOPHEN 160 MG/5ML PO SOLN: 975.0000 mg | Freq: Once | ORAL | Status: DC | PRN

## 2021-09-16 MED ORDER — SIGHTPATH DOSE#1 BSS IO SOLN
INTRAOCULAR | Status: DC | PRN
  Administered 2021-09-16: 2 mL

## 2021-09-16 MED ORDER — MOXIFLOXACIN HCL 0.5 % OP SOLN
OPHTHALMIC | Status: DC | PRN
  Administered 2021-09-16: 0.2 mL via OPHTHALMIC

## 2021-09-16 MED ORDER — SIGHTPATH DOSE#1 NA CHONDROIT SULF-NA HYALURON 40-17 MG/ML IO SOLN
INTRAOCULAR | Status: DC | PRN
  Administered 2021-09-16: 1 mL via INTRAOCULAR

## 2021-09-16 SURGICAL SUPPLY — 12 items
CANNULA ANT/CHMB 27G (MISCELLANEOUS) IMPLANT
CANNULA ANT/CHMB 27GA (MISCELLANEOUS) IMPLANT
CATARACT SUITE SIGHTPATH (MISCELLANEOUS) ×2 IMPLANT
FEE CATARACT SUITE SIGHTPATH (MISCELLANEOUS) ×1 IMPLANT
GLOVE SURG ENC TEXT LTX SZ8 (GLOVE) ×2 IMPLANT
GLOVE SURG TRIUMPH 8.0 PF LTX (GLOVE) ×2 IMPLANT
LENS IOL TECNIS EYHANCE 20.5 (Intraocular Lens) ×1 IMPLANT
NDL FILTER BLUNT 18X1 1/2 (NEEDLE) ×1 IMPLANT
NEEDLE FILTER BLUNT 18X 1/2SAF (NEEDLE) ×1
NEEDLE FILTER BLUNT 18X1 1/2 (NEEDLE) ×1 IMPLANT
SYR 3ML LL SCALE MARK (SYRINGE) ×2 IMPLANT
WATER STERILE IRR 250ML POUR (IV SOLUTION) ×2 IMPLANT

## 2021-09-16 NOTE — Op Note (Signed)
PREOPERATIVE DIAGNOSIS:  Nuclear sclerotic cataract of the right eye.   POSTOPERATIVE DIAGNOSIS:  Cataract   OPERATIVE PROCEDURE:ORPROCALL@   SURGEON:  Birder Robson, MD.   ANESTHESIA:  Anesthesiologist: April Manson, MD CRNA: Jeannene Patella, CRNA  1.      Managed anesthesia care. 2.      0.24m of Shugarcaine was instilled in the eye following the paracentesis.   COMPLICATIONS:  None.   TECHNIQUE:   Stop and chop   DESCRIPTION OF PROCEDURE:  The patient was examined and consented in the preoperative holding area where the aforementioned topical anesthesia was applied to the right eye and then brought back to the Operating Room where the right eye was prepped and draped in the usual sterile ophthalmic fashion and a lid speculum was placed. A paracentesis was created with the side port blade and the anterior chamber was filled with viscoelastic. A near clear corneal incision was performed with the steel keratome. A continuous curvilinear capsulorrhexis was performed with a cystotome followed by the capsulorrhexis forceps. Hydrodissection and hydrodelineation were carried out with BSS on a blunt cannula. The lens was removed in a stop and chop  technique and the remaining cortical material was removed with the irrigation-aspiration handpiece. The capsular bag was inflated with viscoelastic and the Technis [id number]  lens was placed in the capsular bag without complication. The remaining viscoelastic was removed from the eye with the irrigation-aspiration handpiece. The wounds were hydrated. The anterior chamber was flushed with BSS and the eye was inflated to physiologic pressure. 0.164mof Vigamox was placed in the anterior chamber. The wounds were found to be water tight. The eye was dressed with Combigan. The patient was given protective glasses to wear throughout the day and a shield with which to sleep tonight. The patient was also given drops with which to begin a drop regimen today  and will follow-up with me in one day. Implant Name Type Inv. Item Serial No. Manufacturer Lot No. LRB No. Used Action  LENS IOL TECNIS EYHANCE 20.5 - [id number] Lens LENS IOL TECNIS EYHANCE 20.5 [id number]  Right 1 Implanted   Procedure(s) with comments: CATARACT EXTRACTION PHACO AND INTRAOCULAR LENS PLACEMENT (IOC) RIGHT 3.80 00:36.0 (Right) - needs to have an AM appointment  Electronically signed: WiBirder Robson/23/2023 8:33 AM

## 2021-09-16 NOTE — Anesthesia Preprocedure Evaluation (Signed)
Anesthesia Evaluation  Patient identified by MRN, date of birth, ID band Patient awake    Reviewed: Allergy & Precautions, H&P , NPO status , Patient's Chart, lab work & pertinent test results, reviewed documented beta blocker date and time   Airway Mallampati: II  TM Distance: >3 FB Neck ROM: full    Dental no notable dental hx.    Pulmonary neg pulmonary ROS,  H/o covid   Pulmonary exam normal breath sounds clear to auscultation       Cardiovascular Exercise Tolerance: Good negative cardio ROS   Rhythm:regular Rate:Normal  HLD   Neuro/Psych negative neurological ROS  negative psych ROS   GI/Hepatic negative GI ROS, Neg liver ROS,   Endo/Other  negative endocrine ROS  Renal/GU negative Renal ROS  negative genitourinary   Musculoskeletal   Abdominal   Peds  Hematology negative hematology ROS (+)   Anesthesia Other Findings   Reproductive/Obstetrics negative OB ROS                             Anesthesia Physical Anesthesia Plan  ASA: 2  Anesthesia Plan: MAC   Post-op Pain Management:    Induction:   PONV Risk Score and Plan: 2 and Midazolam, TIVA and Treatment may vary due to age or medical condition  Airway Management Planned:   Additional Equipment:   Intra-op Plan:   Post-operative Plan:   Informed Consent: I have reviewed the patients History and Physical, chart, labs and discussed the procedure including the risks, benefits and alternatives for the proposed anesthesia with the patient or authorized representative who has indicated his/her understanding and acceptance.     Dental Advisory Given  Plan Discussed with: CRNA  Anesthesia Plan Comments:         Anesthesia Quick Evaluation

## 2021-09-16 NOTE — Anesthesia Postprocedure Evaluation (Signed)
Anesthesia Post Note  Patient: Stacey Munoz  Procedure(s) Performed: CATARACT EXTRACTION PHACO AND INTRAOCULAR LENS PLACEMENT (IOC) RIGHT 3.80 00:36.0 (Right: Eye)     Patient location during evaluation: PACU Anesthesia Type: MAC Level of consciousness: awake and alert Pain management: pain level controlled Vital Signs Assessment: post-procedure vital signs reviewed and stable Respiratory status: spontaneous breathing, nonlabored ventilation and respiratory function stable Cardiovascular status: stable and blood pressure returned to baseline Postop Assessment: no apparent nausea or vomiting Anesthetic complications: no   No notable events documented.  April Manson

## 2021-09-16 NOTE — Transfer of Care (Signed)
Immediate Anesthesia Transfer of Care Note  Patient: Stacey Munoz  Procedure(s) Performed: CATARACT EXTRACTION PHACO AND INTRAOCULAR LENS PLACEMENT (IOC) RIGHT 3.80 00:36.0 (Right: Eye)  Patient Location: PACU  Anesthesia Type: MAC  Level of Consciousness: awake, alert  and patient cooperative  Airway and Oxygen Therapy: Patient Spontanous Breathing and Patient connected to supplemental oxygen  Post-op Assessment: Post-op Vital signs reviewed, Patient's Cardiovascular Status Stable, Respiratory Function Stable, Patent Airway and No signs of Nausea or vomiting  Post-op Vital Signs: Reviewed and stable  Complications: No notable events documented.

## 2021-09-16 NOTE — H&P (Signed)
[facility]   Primary Care Physician:  McLean-Scocuzza, Nino Glow, MD Ophthalmologist: Dr. George Ina  Pre-Procedure History & Physical: HPI:  Stacey Munoz is a 76 y.o. female here for cataract surgery.   Past Medical History:  Diagnosis Date   COVID-19    11/08/20   Hyperlipidemia    Low serum vitamin D    Osteoporosis     Past Surgical History:  Procedure Laterality Date   COLONOSCOPY WITH PROPOFOL N/A 05/20/2021   Procedure: COLONOSCOPY WITH PROPOFOL;  Surgeon: Lucilla Lame, MD;  Location: [facility] [place] ENDOSCOPY;  Service: Endoscopy;  Laterality: N/A;   FACIAL COSMETIC SURGERY     FRACTURE SURGERY     KNEE ARTHROSCOPY Right 01/2011   torn meniscus   ROOT CANAL Left    04/2021 surgery schedule   TRIGGER FINGER RELEASE Right 08/2014   TUBAL LIGATION     WRIST FRACTURE SURGERY     left    Prior to Admission medications   Medication Sig Start Date End Date Taking? Authorizing Provider  CALCIUM PO Take 1,000 mg by mouth daily at 12 noon.   Yes [provider]  Cholecalciferol (VITAMIN D3 PO) Take 20 mcg by mouth daily at 12 noon.   Yes [provider]  Flaxseed Oil OIL Take 30 mLs by mouth daily at 12 noon. 2 tablespoons = 30 mL   Yes [provider]  ipratropium (ATROVENT) 0.03 % nasal spray Place 2 sprays into both nostrils every 12 (twelve) hours.   Yes [provider]  magnesium oxide (MAG-OX) 400 MG tablet Take 400 mg by mouth daily.   Yes [provider]  Misc Natural Products (ELDERBERRY ZINC/VIT C/IMMUNE MT) Use as directed in the mouth or throat daily at 12 noon.   Yes [provider]  Multiple Vitamins-Minerals (CENTRUM SILVER PO) Take by mouth daily at 12 noon.   Yes [provider]  Sod Picosulfate-Mag Ox-Cit Acd (CLENPIQ) 10-3.5-12 MG-GM -GM/160ML SOLN Take 320 mLs by mouth as directed. 05/13/21  Yes Lucilla Lame, MD    Allergies as of 06/18/2021 - Review Complete 05/20/2021  Allergen Reaction Noted    Statins  03/22/2019    Family History  Problem Relation Age of Onset   Cancer Mother        breast cancer   Breast cancer Mother 73   Hypertension Mother    Hyperlipidemia Mother    Diabetes Father    Cancer Father        lymphoma    Heart disease Brother    Clotting disorder Brother        on Coumadin    Kidney disease Maternal Grandmother        died young age when her mother was 10 y.o    Cancer Maternal Grandfather    Heart attack Maternal Grandfather    Cancer Paternal Grandmother        ?breast cancer   Breast cancer Paternal Grandmother        PT think it was cancer; never got check    Other Paternal Grandfather        polio   CAD Son        x 2   Gout Son     Social History   Socioeconomic History   Marital status: Single    Spouse name: Not on file   Number of children: 3   Years of education: Not on file   Highest education level: Doctorate  Occupational History  Employer: RETIRED  Tobacco Use   Smoking status: Never   Smokeless tobacco: Never   Tobacco comments:    smoking cessation materials not required  Vaping Use   Vaping Use: Never used  Substance and Sexual Activity   Alcohol use: Yes    Alcohol/week: 1.0 standard drink    Types: 1 Glasses of wine per week    Comment: rare   Drug use: No   Sexual activity: Not Currently  Other Topics Concern   Not on file  Social History Narrative   [employer] used to work    3 sons    DPR son Jeanmarie Hubert [phone]   7 grandchildren 1 boy and 6 girls    Used to horse back ride    Likes exercise silver sneakers, massage 1 x per month, yoga walks 2-3 miles daily and does weight lifting    Never smoker    Social Determinants of Radio broadcast assistant Strain: Not on file  Food Insecurity: Not on file  Transportation Needs: Not on file  Physical Activity: Not on file  Stress: Not on file  Social Connections: Not on file  Intimate Partner Violence: Not on file    Review of  Systems: See HPI, otherwise negative ROS  Physical Exam: BP 136/75   Pulse 83   Temp (!) 97.2 F (36.2 C)   Ht '5\' 4"'$  (1.626 m)   Wt 59.4 kg   SpO2 99%   BMI 22.49 kg/m  General:   Alert, cooperative in NAD Head:  Normocephalic and atraumatic. Respiratory:  Normal work of breathing. Cardiovascular:  RRR  Impression/Plan: Stacey Munoz is here for cataract surgery.  Risks, benefits, limitations, and alternatives regarding cataract surgery have been reviewed with the patient.  Questions have been answered.  All parties agreeable.   Birder Robson, MD  09/16/2021, 8:11 AM

## 2021-09-16 NOTE — Anesthesia Procedure Notes (Signed)
Procedure Name: MAC Date/Time: 09/16/2021 8:23 AM Performed by: Jeannene Patella, CRNA Pre-anesthesia Checklist: Patient identified, Emergency Drugs available, Suction available, Timeout performed and Patient being monitored Patient Re-evaluated:Patient Re-evaluated prior to induction Oxygen Delivery Method: Nasal cannula Placement Confirmation: positive ETCO2

## 2021-09-17 ENCOUNTER — Encounter: Payer: Self-pay | Admitting: Ophthalmology

## 2021-09-23 ENCOUNTER — Encounter: Payer: Self-pay | Admitting: Ophthalmology

## 2021-09-24 DIAGNOSIS — H2512 Age-related nuclear cataract, left eye: Secondary | ICD-10-CM | POA: Diagnosis not present

## 2021-09-25 NOTE — Discharge Instructions (Signed)

## 2021-09-30 ENCOUNTER — Ambulatory Visit: Payer: Medicare HMO | Admitting: Anesthesiology

## 2021-09-30 ENCOUNTER — Ambulatory Visit
Admission: RE | Admit: 2021-09-30 | Discharge: 2021-09-30 | Disposition: A | Discharge status: Home / Self Care | Payer: Medicare HMO | Attending: Ophthalmology | Admitting: Ophthalmology

## 2021-09-30 ENCOUNTER — Other Ambulatory Visit: Payer: Self-pay

## 2021-09-30 ENCOUNTER — Encounter
Admission: RE | Disposition: A | Discharge status: Home / Self Care | Payer: Self-pay | Source: Home / Self Care | Attending: Ophthalmology

## 2021-09-30 ENCOUNTER — Encounter: Payer: Self-pay | Admitting: Ophthalmology

## 2021-09-30 DIAGNOSIS — H2512 Age-related nuclear cataract, left eye: Secondary | ICD-10-CM | POA: Diagnosis not present

## 2021-09-30 DIAGNOSIS — H25812 Combined forms of age-related cataract, left eye: Secondary | ICD-10-CM | POA: Diagnosis not present

## 2021-09-30 HISTORY — PX: CATARACT EXTRACTION W/PHACO: SHX586

## 2021-09-30 SURGERY — PHACOEMULSIFICATION, CATARACT, WITH IOL INSERTION
Anesthesia: Monitor Anesthesia Care | Site: Eye | Laterality: Left

## 2021-09-30 MED ORDER — ARMC OPHTHALMIC DILATING DROPS
1.0000 "application " | OPHTHALMIC | Status: DC | PRN
  Administered 2021-09-30 (×3): 1 via OPHTHALMIC

## 2021-09-30 MED ORDER — MIDAZOLAM HCL 2 MG/2ML IJ SOLN
INTRAMUSCULAR | Status: DC | PRN
  Administered 2021-09-30: 2 mg via INTRAVENOUS

## 2021-09-30 MED ORDER — SIGHTPATH DOSE#1 NA CHONDROIT SULF-NA HYALURON 40-17 MG/ML IO SOLN
INTRAOCULAR | Status: DC | PRN
  Administered 2021-09-30: 1 mL via INTRAOCULAR

## 2021-09-30 MED ORDER — SIGHTPATH DOSE#1 BSS IO SOLN
INTRAOCULAR | Status: DC | PRN
  Administered 2021-09-30: 2 mL

## 2021-09-30 MED ORDER — TETRACAINE HCL 0.5 % OP SOLN
1.0000 [drp] | OPHTHALMIC | Status: DC | PRN
  Administered 2021-09-30 (×3): 1 [drp] via OPHTHALMIC

## 2021-09-30 MED ORDER — SIGHTPATH DOSE#1 BSS IO SOLN
INTRAOCULAR | Status: DC | PRN
  Administered 2021-09-30: 73 mL via OPHTHALMIC

## 2021-09-30 MED ORDER — BRIMONIDINE TARTRATE-TIMOLOL 0.2-0.5 % OP SOLN
OPHTHALMIC | Status: DC | PRN
  Administered 2021-09-30: 1 [drp] via OPHTHALMIC

## 2021-09-30 MED ORDER — FENTANYL CITRATE (PF) 100 MCG/2ML IJ SOLN
INTRAMUSCULAR | Status: DC | PRN
  Administered 2021-09-30: 100 ug via INTRAVENOUS

## 2021-09-30 MED ORDER — SIGHTPATH DOSE#1 BSS IO SOLN
INTRAOCULAR | Status: DC | PRN
  Administered 2021-09-30: 15 mL via INTRAOCULAR

## 2021-09-30 MED ORDER — MOXIFLOXACIN HCL 0.5 % OP SOLN
OPHTHALMIC | Status: DC | PRN
  Administered 2021-09-30: 0.2 mL via OPHTHALMIC

## 2021-09-30 SURGICAL SUPPLY — 12 items
CANNULA ANT/CHMB 27G (MISCELLANEOUS) IMPLANT
CANNULA ANT/CHMB 27GA (MISCELLANEOUS) IMPLANT
CATARACT SUITE SIGHTPATH (MISCELLANEOUS) ×2 IMPLANT
FEE CATARACT SUITE SIGHTPATH (MISCELLANEOUS) ×1 IMPLANT
GLOVE SURG ENC TEXT LTX SZ8 (GLOVE) ×2 IMPLANT
GLOVE SURG TRIUMPH 8.0 PF LTX (GLOVE) ×2 IMPLANT
LENS IOL TECNIS EYHANCE 20.5 (Intraocular Lens) ×1 IMPLANT
NDL FILTER BLUNT 18X1 1/2 (NEEDLE) ×1 IMPLANT
NEEDLE FILTER BLUNT 18X 1/2SAF (NEEDLE) ×1
NEEDLE FILTER BLUNT 18X1 1/2 (NEEDLE) ×1 IMPLANT
SYR 3ML LL SCALE MARK (SYRINGE) ×2 IMPLANT
WATER STERILE IRR 250ML POUR (IV SOLUTION) ×2 IMPLANT

## 2021-09-30 NOTE — Anesthesia Postprocedure Evaluation (Signed)
Anesthesia Post Note  Patient: Stacey Munoz  Procedure(s) Performed: CATARACT EXTRACTION PHACO AND INTRAOCULAR LENS PLACEMENT (IOC) LEFT 5.17 00:33.9 (Left: Eye)     Patient location during evaluation: PACU Anesthesia Type: MAC Level of consciousness: awake and alert Pain management: pain level controlled Vital Signs Assessment: post-procedure vital signs reviewed and stable Respiratory status: spontaneous breathing Cardiovascular status: stable Anesthetic complications: no   No notable events documented.  Gillian Scarce

## 2021-09-30 NOTE — Anesthesia Preprocedure Evaluation (Signed)
Anesthesia Evaluation  Patient identified by MRN, date of birth, ID band Patient awake    Reviewed: Allergy & Precautions, H&P , NPO status , Patient's Chart, lab work & pertinent test results, reviewed documented beta blocker date and time   Airway Mallampati: II  TM Distance: >3 FB Neck ROM: full    Dental no notable dental hx.    Pulmonary neg pulmonary ROS,  H/o covid   Pulmonary exam normal breath sounds clear to auscultation       Cardiovascular Exercise Tolerance: Good negative cardio ROS Normal cardiovascular exam Rhythm:regular Rate:Normal  HLD   Neuro/Psych negative neurological ROS  negative psych ROS   GI/Hepatic negative GI ROS, Neg liver ROS,   Endo/Other  negative endocrine ROS  Renal/GU negative Renal ROS  negative genitourinary   Musculoskeletal   Abdominal   Peds  Hematology negative hematology ROS (+)   Anesthesia Other Findings   Reproductive/Obstetrics negative OB ROS                             Anesthesia Physical  Anesthesia Plan  ASA: 2  Anesthesia Plan: MAC   Post-op Pain Management:    Induction:   PONV Risk Score and Plan: 2 and Midazolam, TIVA and Treatment may vary due to age or medical condition  Airway Management Planned:   Additional Equipment:   Intra-op Plan:   Post-operative Plan:   Informed Consent: I have reviewed the patients History and Physical, chart, labs and discussed the procedure including the risks, benefits and alternatives for the proposed anesthesia with the patient or authorized representative who has indicated his/her understanding and acceptance.     Dental Advisory Given  Plan Discussed with: CRNA  Anesthesia Plan Comments:         Anesthesia Quick Evaluation

## 2021-09-30 NOTE — H&P (Signed)
[facility]   Primary Care Physician:  McLean-Scocuzza, Nino Glow, MD Ophthalmologist: Dr. George Ina  Pre-Procedure History & Physical: HPI:  Stacey Munoz is a 76 y.o. female here for cataract surgery.   Past Medical History:  Diagnosis Date   COVID-19    11/08/20   Hyperlipidemia    Low serum vitamin D    Osteoporosis     Past Surgical History:  Procedure Laterality Date   CATARACT EXTRACTION W/PHACO Right 09/16/2021   Procedure: CATARACT EXTRACTION PHACO AND INTRAOCULAR LENS PLACEMENT (IOC) RIGHT 3.80 00:36.0;  Surgeon: Birder Robson, MD;  Location: [place];  Service: Ophthalmology;  Laterality: Right;  needs to have an AM appointment   COLONOSCOPY WITH PROPOFOL N/A 05/20/2021   Procedure: COLONOSCOPY WITH PROPOFOL;  Surgeon: Lucilla Lame, MD;  Location: [facility] ENDOSCOPY;  Service: Endoscopy;  Laterality: N/A;   FACIAL COSMETIC SURGERY     FRACTURE SURGERY     KNEE ARTHROSCOPY Right 01/2011   torn meniscus   ROOT CANAL Left    04/2021 surgery schedule   TRIGGER FINGER RELEASE Right 08/2014   TUBAL LIGATION     WRIST FRACTURE SURGERY     left    Prior to Admission medications   Medication Sig Start Date End Date Taking? Authorizing Provider  CALCIUM PO Take 1,000 mg by mouth daily at 12 noon.   Yes [provider]  Cholecalciferol (VITAMIN D3 PO) Take 20 mcg by mouth daily at 12 noon.   Yes [provider]  Flaxseed Oil OIL Take 30 mLs by mouth daily at 12 noon. 2 tablespoons = 30 mL   Yes [provider]  ipratropium (ATROVENT) 0.03 % nasal spray Place 2 sprays into both nostrils every 12 (twelve) hours.   Yes [provider]  magnesium oxide (MAG-OX) 400 MG tablet Take 400 mg by mouth daily.   Yes [provider]  Misc Natural Products (ELDERBERRY ZINC/VIT C/IMMUNE MT) Use as directed in the mouth or throat daily at 12 noon.   Yes [provider]  Multiple Vitamins-Minerals (CENTRUM SILVER PO) Take by  mouth daily at 12 noon.   Yes [provider]  Sod Picosulfate-Mag Ox-Cit Acd (CLENPIQ) 10-3.5-12 MG-GM -GM/160ML SOLN Take 320 mLs by mouth as directed. 05/13/21   Lucilla Lame, MD    Allergies as of 06/18/2021 - Review Complete 05/20/2021  Allergen Reaction Noted   Statins  03/22/2019    Family History  Problem Relation Age of Onset   Cancer Mother        breast cancer   Breast cancer Mother 97   Hypertension Mother    Hyperlipidemia Mother    Diabetes Father    Cancer Father        lymphoma    Heart disease Brother    Clotting disorder Brother        on Coumadin    Kidney disease Maternal Grandmother        died young age when her mother was 10 y.o    Cancer Maternal Grandfather    Heart attack Maternal Grandfather    Cancer Paternal Grandmother        ?breast cancer   Breast cancer Paternal Grandmother        PT think it was cancer; never got check    Other Paternal Grandfather        polio   CAD Son        x 2   Gout Son     Social  History   Socioeconomic History   Marital status: Single    Spouse name: Not on file   Number of children: 3   Years of education: Not on file   Highest education level: Doctorate  Occupational History    Employer: RETIRED  Tobacco Use   Smoking status: Never   Smokeless tobacco: Never   Tobacco comments:    smoking cessation materials not required  Vaping Use   Vaping Use: Never used  Substance and Sexual Activity   Alcohol use: Yes    Alcohol/week: 1.0 standard drink    Types: 1 Glasses of wine per week    Comment: rare   Drug use: No   Sexual activity: Not Currently  Other Topics Concern   Not on file  Social History Narrative   [employer] used to work    3 sons    DPR son Jeanmarie Hubert [phone]   7 grandchildren 1 boy and 6 girls    Used to horse back ride    Likes exercise silver sneakers, massage 1 x per month, yoga walks 2-3 miles daily and does weight lifting    Never smoker    Social  Determinants of Radio broadcast assistant Strain: Not on file  Food Insecurity: Not on file  Transportation Needs: Not on file  Physical Activity: Not on file  Stress: Not on file  Social Connections: Not on file  Intimate Partner Violence: Not on file    Review of Systems: See HPI, otherwise negative ROS  Physical Exam: BP 119/61   Pulse 61   Temp 98.1 F (36.7 C)   Resp 16   Ht '5\' 4"'$  (1.626 m)   Wt 59.4 kg   SpO2 100%   BMI 22.49 kg/m  General:   Alert, cooperative in NAD Head:  Normocephalic and atraumatic. Respiratory:  Normal work of breathing. Cardiovascular:  RRR  Impression/Plan: Stacey Munoz is here for cataract surgery.  Risks, benefits, limitations, and alternatives regarding cataract surgery have been reviewed with the patient.  Questions have been answered.  All parties agreeable.   Birder Robson, MD  09/30/2021, 11:38 AM

## 2021-09-30 NOTE — Transfer of Care (Signed)
Immediate Anesthesia Transfer of Care Note  Patient: Stacey Munoz  Procedure(s) Performed: CATARACT EXTRACTION PHACO AND INTRAOCULAR LENS PLACEMENT (IOC) LEFT 5.17 00:33.9 (Left: Eye)  Patient Location: PACU  Anesthesia Type: MAC  Level of Consciousness: awake, alert  and patient cooperative  Airway and Oxygen Therapy: Patient Spontanous Breathing and Patient connected to supplemental oxygen  Post-op Assessment: Post-op Vital signs reviewed, Patient's Cardiovascular Status Stable, Respiratory Function Stable, Patent Airway and No signs of Nausea or vomiting  Post-op Vital Signs: Reviewed and stable  Complications: No notable events documented.

## 2021-09-30 NOTE — Op Note (Signed)
PREOPERATIVE DIAGNOSIS:  Nuclear sclerotic cataract of the left eye.   POSTOPERATIVE DIAGNOSIS:  Nuclear sclerotic cataract of the left eye.   OPERATIVE PROCEDURE:ORPROCALL@   SURGEON:  Birder Robson, MD.   ANESTHESIA:  Anesthesiologist: Elgie Collard, MD CRNA: Silvana Newness, CRNA  1.      Managed anesthesia care. 2.     0.80m of Shugarcaine was instilled following the paracentesis   COMPLICATIONS:  None.   TECHNIQUE:   Stop and chop   DESCRIPTION OF PROCEDURE:  The patient was examined and consented in the preoperative holding area where the aforementioned topical anesthesia was applied to the left eye and then brought back to the Operating Room where the left eye was prepped and draped in the usual sterile ophthalmic fashion and a lid speculum was placed. A paracentesis was created with the side port blade and the anterior chamber was filled with viscoelastic. A near clear corneal incision was performed with the steel keratome. A continuous curvilinear capsulorrhexis was performed with a cystotome followed by the capsulorrhexis forceps. Hydrodissection and hydrodelineation were carried out with BSS on a blunt cannula. The lens was removed in a stop and chop  technique and the remaining cortical material was removed with the irrigation-aspiration handpiece. The capsular bag was inflated with viscoelastic and the Technis [id number] lens was placed in the capsular bag without complication. The remaining viscoelastic was removed from the eye with the irrigation-aspiration handpiece. The wounds were hydrated. The anterior chamber was flushed with BSS and the eye was inflated to physiologic pressure. 0.177mVigamox was placed in the anterior chamber. The wounds were found to be water tight. The eye was dressed with Combigan. The patient was given protective glasses to wear throughout the day and a shield with which to sleep tonight. The patient was also given drops with which to begin a drop regimen  today and will follow-up with me in one day. Implant Name Type Inv. Item Serial No. Manufacturer Lot No. LRB No. Used Action  LENS IOL TECNIS EYHANCE 20.5 - [id number] Lens LENS IOL TECNIS EYHANCE 20.5 [id number]  Left 1 Implanted    Procedure(s): CATARACT EXTRACTION PHACO AND INTRAOCULAR LENS PLACEMENT (IOC) LEFT 5.17 00:33.9 (Left)  Electronically signed: WiBirder Robson/09/2021 12:03 PM

## 2021-10-01 ENCOUNTER — Encounter: Payer: Self-pay | Admitting: Ophthalmology

## 2021-10-13 ENCOUNTER — Ambulatory Visit
Admission: RE | Admit: 2021-10-13 | Discharge: 2021-10-13 | Disposition: A | Discharge status: Home / Self Care | Payer: Medicare HMO | Source: Ambulatory Visit | Attending: Internal Medicine | Admitting: Internal Medicine

## 2021-10-13 DIAGNOSIS — Z1231 Encounter for screening mammogram for malignant neoplasm of breast: Secondary | ICD-10-CM | POA: Insufficient documentation

## 2021-10-14 DIAGNOSIS — M5136 Other intervertebral disc degeneration, lumbar region: Secondary | ICD-10-CM | POA: Diagnosis not present

## 2021-10-14 DIAGNOSIS — M62830 Muscle spasm of back: Secondary | ICD-10-CM | POA: Diagnosis not present

## 2021-10-14 DIAGNOSIS — M9903 Segmental and somatic dysfunction of lumbar region: Secondary | ICD-10-CM | POA: Diagnosis not present

## 2021-10-14 DIAGNOSIS — M5416 Radiculopathy, lumbar region: Secondary | ICD-10-CM | POA: Diagnosis not present

## 2021-11-04 ENCOUNTER — Encounter: Payer: Self-pay | Admitting: Internal Medicine

## 2021-11-04 DIAGNOSIS — M5136 Other intervertebral disc degeneration, lumbar region: Secondary | ICD-10-CM | POA: Diagnosis not present

## 2021-11-04 DIAGNOSIS — M9903 Segmental and somatic dysfunction of lumbar region: Secondary | ICD-10-CM | POA: Diagnosis not present

## 2021-11-04 DIAGNOSIS — M5416 Radiculopathy, lumbar region: Secondary | ICD-10-CM | POA: Diagnosis not present

## 2021-11-04 DIAGNOSIS — M62830 Muscle spasm of back: Secondary | ICD-10-CM | POA: Diagnosis not present

## 2021-11-06 ENCOUNTER — Ambulatory Visit (INDEPENDENT_AMBULATORY_CARE_PROVIDER_SITE_OTHER): Payer: Medicare HMO

## 2021-11-06 ENCOUNTER — Ambulatory Visit (INDEPENDENT_AMBULATORY_CARE_PROVIDER_SITE_OTHER): Payer: Medicare HMO | Admitting: Internal Medicine

## 2021-11-06 ENCOUNTER — Encounter: Payer: Self-pay | Admitting: Internal Medicine

## 2021-11-06 VITALS — BP 120/70 | HR 82 | Temp 97.8°F | Resp 14 | Ht 64.0 in | Wt 131.8 lb

## 2021-11-06 DIAGNOSIS — M545 Low back pain, unspecified: Secondary | ICD-10-CM | POA: Diagnosis not present

## 2021-11-06 DIAGNOSIS — Z23 Encounter for immunization: Secondary | ICD-10-CM

## 2021-11-06 DIAGNOSIS — M533 Sacrococcygeal disorders, not elsewhere classified: Secondary | ICD-10-CM | POA: Diagnosis not present

## 2021-11-06 DIAGNOSIS — M1610 Unilateral primary osteoarthritis, unspecified hip: Secondary | ICD-10-CM | POA: Diagnosis not present

## 2021-11-06 DIAGNOSIS — L719 Rosacea, unspecified: Secondary | ICD-10-CM | POA: Diagnosis not present

## 2021-11-06 DIAGNOSIS — M5416 Radiculopathy, lumbar region: Secondary | ICD-10-CM | POA: Diagnosis not present

## 2021-11-06 DIAGNOSIS — D259 Leiomyoma of uterus, unspecified: Secondary | ICD-10-CM

## 2021-11-06 DIAGNOSIS — M5137 Other intervertebral disc degeneration, lumbosacral region: Secondary | ICD-10-CM

## 2021-11-06 DIAGNOSIS — M5432 Sciatica, left side: Secondary | ICD-10-CM

## 2021-11-06 DIAGNOSIS — M51379 Other intervertebral disc degeneration, lumbosacral region without mention of lumbar back pain or lower extremity pain: Secondary | ICD-10-CM

## 2021-11-06 MED ORDER — TETANUS-DIPHTH-ACELL PERTUSSIS 5-2.5-18.5 LF-MCG/0.5 IM SUSP: 0.5000 mL | Freq: Once | INTRAMUSCULAR | 0 refills | Status: AC

## 2021-11-06 NOTE — Progress Notes (Signed)
Chief Complaint  Patient presents with   Hip Pain    Ongoing for 2 yrs. Has been seeing chiropractor & message therapist which has been helping, pain worse at night which prevent sleep, has not had a flare up in the past wk   Referral    Dermatology for rosacea   F/u  1. C/o pain in L>right hip and low back pain seen chiropractor and massage therapy but pain is affecting walking which she likes. Not able to sleep at night due to pain at times currently today no pain but pain is mild to moderate at times   FINDINGS: 5 non rib-bearing lumbar type vertebral bodies are present. Chronic loss of disc height and facet hypertrophy is present at L4-5 and L5-S1. No acute abnormalities are present. Calcification in the left and tongue pelvis likely represents a uterine fibroid.   IMPRESSION: 1. Chronic degenerative disc disease and facet hypertrophy at L4-5 and L5-S1. 2. No acute abnormality.     Electronically Signed   By: San Morelle M.D.   On: 11/06/2021 14:34 FINDINGS: There is no evidence of fracture or other focal bone lesions. Moderately severe lumbar spondylosis with facet hypertrophic changes in the visualized lower lumbar and lumbosacral spine. Moderate loss of the L5-S1 disc space. Likely calcified uterine fibroid at the left side of the pelvis.   Mild degenerative changes at the bilateral hip joints greater on the right. Osteopenia.   IMPRESSION: No fracture or discrete bony lesion. Moderately severe spondylosis of the visualized lumbosacral spine. Osteopenia.   Degenerative changes of the bilateral hip joints greater on the right.     Electronically Signed   By: Frazier Richards M.D.   On: 11/06/2021 14:33   2. Rosacea  Refer to dermatology     Review of Systems  Constitutional:  Negative for weight loss.  HENT:  Negative for hearing loss.   Eyes:  Negative for blurred vision.  Respiratory:  Negative for shortness of breath.   Cardiovascular:   Negative for chest pain.  Gastrointestinal:  Negative for abdominal pain and blood in stool.  Genitourinary:  Negative for dysuria.  Musculoskeletal:  Positive for back pain and joint pain. Negative for falls.  Skin:  Negative for rash.  Neurological:  Negative for headaches.  Psychiatric/Behavioral:  Negative for depression.    Past Medical History:  Diagnosis Date   COVID-19    11/08/20   Hyperlipidemia    Low serum vitamin D    Osteoporosis    Past Surgical History:  Procedure Laterality Date   CATARACT EXTRACTION W/PHACO Right 09/16/2021   Procedure: CATARACT EXTRACTION PHACO AND INTRAOCULAR LENS PLACEMENT (IOC) RIGHT 3.80 00:36.0;  Surgeon: Birder Robson, MD;  Location: [place];  Service: Ophthalmology;  Laterality: Right;  needs to have an AM appointment   CATARACT EXTRACTION W/PHACO Left 09/30/2021   Procedure: CATARACT EXTRACTION PHACO AND INTRAOCULAR LENS PLACEMENT (IOC) LEFT 5.17 00:33.9;  Surgeon: Birder Robson, MD;  Location: [place];  Service: Ophthalmology;  Laterality: Left;   COLONOSCOPY WITH PROPOFOL N/A 05/20/2021   Procedure: COLONOSCOPY WITH PROPOFOL;  Surgeon: Lucilla Lame, MD;  Location: [facility] ENDOSCOPY;  Service: Endoscopy;  Laterality: N/A;   FACIAL COSMETIC SURGERY     FRACTURE SURGERY     KNEE ARTHROSCOPY Right 01/2011   torn meniscus   ROOT CANAL Left    04/2021 surgery schedule   TRIGGER FINGER RELEASE Right 08/2014   TUBAL LIGATION     WRIST FRACTURE SURGERY  left   Family History  Problem Relation Age of Onset   Cancer Mother        breast cancer   Breast cancer Mother 42   Hypertension Mother    Hyperlipidemia Mother    Diabetes Father    Cancer Father        lymphoma    Heart disease Brother    Clotting disorder Brother        on Coumadin    Kidney disease Maternal Grandmother        died young age when her mother was 7 y.o    Cancer Maternal Grandfather    Heart attack Maternal Grandfather    Cancer  Paternal Grandmother        ?breast cancer   Breast cancer Paternal Grandmother        PT think it was cancer; never got check    Other Paternal Grandfather        polio   CAD Son        x 2   Gout Son    CAD Son        cad age 62 y.o   Social History   Socioeconomic History   Marital status: Single    Spouse name: Not on file   Number of children: 3   Years of education: Not on file   Highest education level: Doctorate  Occupational History    Employer: RETIRED  Tobacco Use   Smoking status: Never   Smokeless tobacco: Never   Tobacco comments:    smoking cessation materials not required  Vaping Use   Vaping Use: Never used  Substance and Sexual Activity   Alcohol use: Yes    Alcohol/week: 1.0 standard drink of alcohol    Types: 1 Glasses of wine per week    Comment: rare   Drug use: No   Sexual activity: Not Currently  Other Topics Concern   Not on file  Social History Narrative   [employer] used to work    3 sons    DPR son Jeanmarie Hubert [phone]   7 grandchildren 1 boy and 6 girls    Used to horse back ride    Likes exercise silver sneakers, massage 1 x per month, yoga walks 2-3 miles daily and does weight lifting    Never smoker    Social Determinants of Health   Financial Resource Strain: Low Risk  (05/18/2017)   Overall Financial Resource Strain (CARDIA)    Difficulty of Paying Living Expenses: Not hard at all  Food Insecurity: No Food Insecurity (05/18/2017)   Hunger Vital Sign    Worried About Running Out of Food in the Last Year: Never true    [place] in the Last Year: Never true  Transportation Needs: No Transportation Needs (05/18/2017)   PRAPARE - Hydrologist (Medical): No    Lack of Transportation (Non-Medical): No  Physical Activity: Sufficiently Active (05/18/2017)   Exercise Vital Sign    Days of Exercise per Week: 6 days    Minutes of Exercise per Session: 120 min  Stress: No Stress Concern  Present (05/18/2017)   [place]    Feeling of Stress : Not at all  Social Connections: Unknown (05/19/2018)   Social Connection and Isolation Panel [NHANES]    Frequency of Communication with Friends and Family: More than three times a week    Frequency  of Social Gatherings with Friends and Family: More than three times a week    Attends Religious Services: More than 4 times per year    Active Member of [employer] or Organizations: Yes    Attends Archivist Meetings: More than 4 times per year    Marital Status: Not on file  Intimate Partner Violence: Not At Risk (05/18/2017)   Humiliation, Afraid, Rape, and Kick questionnaire    Fear of Current or Ex-Partner: No    Emotionally Abused: No    Physically Abused: No    Sexually Abused: No   Current Meds  Medication Sig   CALCIUM PO Take 1,000 mg by mouth daily at 12 noon.   Cholecalciferol (VITAMIN D3 PO) Take 20 mcg by mouth daily at 12 noon.   Flaxseed Oil OIL Take 30 mLs by mouth daily at 12 noon. 2 tablespoons = 30 mL   Misc Natural Products (ELDERBERRY ZINC/VIT C/IMMUNE MT) Use as directed in the mouth or throat daily at 12 noon.   Multiple Vitamins-Minerals (CENTRUM SILVER PO) Take by mouth daily at 12 noon.   [EXPIRED] Tdap (BOOSTRIX) 5-2.5-18.5 LF-MCG/0.5 injection Inject 0.5 mLs into the muscle once for 1 dose.   Allergies  Allergen Reactions   Statins     Muscle aches    No results found for this or any previous visit (from the past 2160 hour(s)). Objective  Body mass index is 22.62 kg/m. Wt Readings from Last 3 Encounters:  11/06/21 131 lb 12.8 oz (59.8 kg)  09/30/21 131 lb (59.4 kg)  09/16/21 131 lb (59.4 kg)   Temp Readings from Last 3 Encounters:  11/06/21 97.8 F (36.6 C) (Oral)  09/30/21 (!) 97 F (36.1 C)  09/16/21 97.7 F (36.5 C)   BP Readings from Last 3 Encounters:  11/06/21 120/70  09/30/21 92/61  09/16/21 111/66   Pulse  Readings from Last 3 Encounters:  11/06/21 82  09/30/21 72  09/16/21 76    Physical Exam Vitals and nursing note reviewed.  Constitutional:      Appearance: Normal appearance. She is well-developed and well-groomed.  HENT:     Head: Normocephalic and atraumatic.  Eyes:     Conjunctiva/sclera: Conjunctivae normal.     Pupils: Pupils are equal, round, and reactive to light.  Cardiovascular:     Rate and Rhythm: Normal rate and regular rhythm.     Heart sounds: Normal heart sounds. No murmur heard. Pulmonary:     Effort: Pulmonary effort is normal.     Breath sounds: Normal breath sounds.  Abdominal:     General: Abdomen is flat. Bowel sounds are normal.     Tenderness: There is no abdominal tenderness.  Musculoskeletal:        General: No tenderness.  Skin:    General: Skin is warm and dry.  Neurological:     General: No focal deficit present.     Mental Status: She is alert and oriented to person, place, and time. Mental status is at baseline.     Cranial Nerves: Cranial nerves 2-12 are intact.     Motor: Motor function is intact.     Coordination: Coordination is intact.     Gait: Gait is intact.  Psychiatric:        Attention and Perception: Attention and perception normal.        Mood and Affect: Mood and affect normal.        Speech: Speech normal.  Behavior: Behavior normal. Behavior is cooperative.        Thought Content: Thought content normal.        Cognition and Memory: Cognition and memory normal.        Judgment: Judgment normal.     Assessment  Plan   Hip arthritis - Plan: Ambulatory referral to Physical Therapy  Sciatica of left side - Plan: Ambulatory referral to Physical Therapy Lumbar radiculopathy with DDD - Plan: DG Lumbar Spine Complete, DG Sacrum/Coccyx, Ambulatory referral to Physical Therapy   Pain in sacrum - Plan: DG Sacrum/Coccyx, Ambulatory referral to Physical Therapy,  F/u chiropractor as well  Rosacea - Plan: Ambulatory  referral to Dermatology  Uterine leiomyoma, incidental    Need for Tdap vaccination - Plan: Tdap (BOOSTRIX) 5-2.5-18.5 LF-MCG/0.5 injection   HM HM Flu shot utd  prevnar had  moderna 4/4 and 1 pfizer  pna 23 01/16/13 rec  shingrix utd  Tdap had 06/02/09 rx today    mammo 10/01/20 negative ordered   No pap out of age window    Colonoscopy per pt had in 2018 in [place] need to track down copy Dr. Allen Norris no copy rec contact [facility] as of 03/31/2019   Colonoscopy late 1990s had 2  ROI today cologuard sen ttoday    dexa 08/24/17 osteoporosis declines tx will check vit D rec D and calcium  +osteoporosis declines [facility] endocrine for now  Declines meds  Consider Xray b/l hips and low back in the future    Skin currently no issues   sch fasting labs today and 1 year    rec healthy diet and exercise  Provider: Dr. Olivia Mackie McLean-Scocuzza-Internal Medicine

## 2021-11-06 NOTE — Patient Instructions (Addendum)
Topical creams asprecream with lidocaine or voltaren gel or lidocaine pain patch  Tylenol  ?dry needling ask physical therapy   Dr. Ralph Dowdy  Phone Fax E-mail Address  [phone] Not available [address]   [place] [address]     Specialties     Dermatology            Sciatica  Sciatica is pain, numbness, weakness, or tingling along the path of the sciatic nerve. The sciatic nerve starts in the lower back and runs down the back of each leg. The nerve controls the muscles in the lower leg and in the back of the knee. It also provides feeling (sensation) to the back of the thigh, the lower leg, and the sole of the foot. Sciatica is a symptom of another medical condition that pinches or puts pressure on the sciatic nerve. Sciatica most often only affects one side of the body. Sciatica usually goes away on its own or with treatment. In some cases, sciatica may come back (recur). What are the causes? This condition is caused by pressure on the sciatic nerve or pinching of the nerve. This may be the result of: A disk in between the bones of the spine bulging out too far (herniated disk). Age-related changes in the spinal disks. A pain disorder that affects a muscle in the buttock. Extra bone growth near the sciatic nerve. A break (fracture) of the pelvis. Pregnancy. Tumor. This is rare. What increases the risk? The following factors may make you more likely to develop this condition: Playing sports that place pressure or stress on the spine. Having poor strength and flexibility. A history of back injury or surgery. Sitting for long periods of time. Doing activities that involve repetitive bending or lifting. Obesity. What are the signs or symptoms? Symptoms can vary from mild to very severe, and they may include: Any of these problems in the lower back, leg, hip, or buttock: Mild tingling, numbness, or dull aches. Burning sensations. Sharp pains. Numbness  in the back of the calf or the sole of the foot. Leg weakness. Severe back pain that makes movement difficult. Symptoms may get worse when you cough, sneeze, or laugh, or when you sit or stand for long periods of time. How is this diagnosed? This condition may be diagnosed based on: Your symptoms and medical history. A physical exam. Blood tests. Imaging tests, such as: X-rays. MRI. CT scan. How is this treated? In many cases, this condition improves on its own without treatment. However, treatment may include: Reducing or modifying physical activity. Exercising and stretching. Icing and applying heat to the affected area. Medicines that help to: Relieve pain and swelling. Relax your muscles. Injections of medicines that help to relieve pain, irritation, and inflammation around the sciatic nerve (steroids). Surgery. Follow these instructions at home: Medicines Take over-the-counter and prescription medicines only as told by your health care provider. Ask your health care provider if the medicine prescribed to you: Requires you to avoid driving or using heavy machinery. Can cause constipation. You may need to take these actions to prevent or treat constipation: Drink enough fluid to keep your urine pale yellow. Take over-the-counter or prescription medicines. Eat foods that are high in fiber, such as beans, whole grains, and fresh fruits and vegetables. Limit foods that are high in fat and processed sugars, such as fried or sweet foods. Managing pain     If directed, put ice on the affected area. Put ice in a plastic bag.  Place a towel between your skin and the bag. Leave the ice on for 20 minutes, 2-3 times a day. If directed, apply heat to the affected area. Use the heat source that your health care provider recommends, such as a moist heat pack or a heating pad. Place a towel between your skin and the heat source. Leave the heat on for 20-30 minutes. Remove the heat if  your skin turns bright red. This is especially important if you are unable to feel pain, heat, or cold. You may have a greater risk of getting burned. Activity  Return to your normal activities as told by your health care provider. Ask your health care provider what activities are safe for you. Avoid activities that make your symptoms worse. Take brief periods of rest throughout the day. When you rest for longer periods, mix in some mild activity or stretching between periods of rest. This will help to prevent stiffness and pain. Avoid sitting for long periods of time without moving. Get up and move around at least one time each hour. Exercise and stretch regularly, as told by your health care provider. Do not lift anything that is heavier than 10 lb (4.5 kg) while you have symptoms of sciatica. When you do not have symptoms, you should still avoid heavy lifting, especially repetitive heavy lifting. When you lift objects, always use proper lifting technique, which includes: Bending your knees. Keeping the load close to your body. Avoiding twisting. General instructions Maintain a healthy weight. Excess weight puts extra stress on your back. Wear supportive, comfortable shoes. Avoid wearing high heels. Avoid sleeping on a mattress that is too soft or too hard. A mattress that is firm enough to support your back when you sleep may help to reduce your pain. Keep all follow-up visits as told by your health care provider. This is important. Contact a health care provider if: You have pain that: Wakes you up when you are sleeping. Gets worse when you lie down. Is worse than you have experienced in the past. Lasts longer than 4 weeks. You have an unexplained weight loss. Get help right away if: You are not able to control when you urinate or have bowel movements (incontinence). You have: Weakness in your lower back, pelvis, buttocks, or legs that gets worse. Redness or swelling of your  back. A burning sensation when you urinate. Summary Sciatica is pain, numbness, weakness, or tingling along the path of the sciatic nerve. This condition is caused by pressure on the sciatic nerve or pinching of the nerve. Sciatica can cause pain, numbness, or tingling in the lower back, legs, hips, and buttocks. Treatment often includes rest, exercise, medicines, and applying ice or heat. This information is not intended to replace advice given to you by your health care provider. Make sure you discuss any questions you have with your health care provider. Document Revised: 05/02/2018 Document Reviewed: 05/02/2018 Elsevier Patient Education  [place] your health care provider which exercises are safe for you. Do exercises exactly as told by your health care provider and adjust them as directed. It is normal to feel mild stretching, pulling, tightness, or discomfort as you do these exercises. Stop right away if you feel sudden pain or your pain gets worse. Do not begin these exercises until told by your health care provider. Stretching and range-of-motion exercises These exercises warm up your muscles and joints and improve the movement and flexibility of your hips and back. These exercises  also help to relieve pain, numbness, and tingling. Sciatic nerve glide Sit in a chair with your head facing down toward your chest. Place your hands behind your back. Let your shoulders slump forward. Slowly straighten one of your legs while you tilt your head back as if you are looking toward the ceiling. Only straighten your leg as far as you can without making your symptoms worse. Hold this position for __________ seconds. Slowly return to the starting position. Repeat with your other leg. Repeat __________ times. Complete this exercise __________ times a day. Knee to chest with hip adduction and internal rotation  Lie on your back on a firm surface with both legs  straight. Bend one of your knees and move it up toward your chest until you feel a gentle stretch in your lower back and buttock. Then, move your knee toward the shoulder that is on the opposite side from your leg. This is hip adduction and internal rotation. Hold your leg in this position by holding on to the front of your knee. Hold this position for __________ seconds. Slowly return to the starting position. Repeat with your other leg. Repeat __________ times. Complete this exercise __________ times a day. Prone extension on elbows  Lie on your abdomen on a firm surface. A bed may be too soft for this exercise. Prop yourself up on your elbows. Use your arms to help lift your chest up until you feel a gentle stretch in your abdomen and your lower back. This will place some of your body weight on your elbows. If this is uncomfortable, try stacking pillows under your chest. Your hips should stay down, against the surface that you are lying on. Keep your hip and back muscles relaxed. Hold this position for __________ seconds. Slowly relax your upper body and return to the starting position. Repeat __________ times. Complete this exercise __________ times a day. Strengthening exercises These exercises build strength and endurance in your back. Endurance is the ability to use your muscles for a long time, even after they get tired. Pelvic tilt This exercise strengthens the muscles that lie deep in the abdomen. Lie on your back on a firm surface. Bend your knees and keep your feet flat on the floor. Tense your abdominal muscles. Tip your pelvis up toward the ceiling and flatten your lower back into the floor. To help with this exercise, you may place a small towel under your lower back and try to push your back into the towel. Hold this position for __________ seconds. Let your muscles relax completely before you repeat this exercise. Repeat __________ times. Complete this exercise __________  times a day. Alternating arm and leg raises  Get on your hands and knees on a firm surface. If you are on a hard floor, you may want to use padding, such as an exercise mat, to cushion your knees. Line up your arms and legs. Your hands should be directly below your shoulders, and your knees should be directly below your hips. Lift your left leg behind you. At the same time, raise your right arm and straighten it in front of you. Do not lift your leg higher than your hip. Do not lift your arm higher than your shoulder. Keep your abdominal and back muscles tight. Keep your hips facing the ground. Do not arch your back. Keep your balance carefully, and do not hold your breath. Hold this position for __________ seconds. Slowly return to the starting position. Repeat with your right leg  and your left arm. Repeat __________ times. Complete this exercise __________ times a day. Posture and body mechanics Good posture and healthy body mechanics can help to relieve stress in your body's tissues and joints. Body mechanics refers to the movements and positions of your body while you do your daily activities. Posture is part of body mechanics. Good posture means: Your spine is in its natural S-curve position (neutral). Your shoulders are pulled back slightly. Your head is not tipped forward. Follow these guidelines to improve your posture and body mechanics in your everyday activities. Standing  When standing, keep your spine neutral and your feet about hip width apart. Keep a slight bend in your knees. Your ears, shoulders, and hips should line up. When you do a task in which you stand in one place for a long time, place one foot up on a stable object that is 2-4 inches (5-10 cm) high, such as a footstool. This helps keep your spine neutral. Sitting  When sitting, keep your spine neutral and keep your feet flat on the floor. Use a footrest, if necessary, and keep your thighs parallel to the floor.  Avoid rounding your shoulders, and avoid tilting your head forward. When working at a desk or a computer, keep your desk at a height where your hands are slightly lower than your elbows. Slide your chair under your desk so you are close enough to maintain good posture. When working at a computer, place your monitor at a height where you are looking straight ahead and you do not have to tilt your head forward or downward to look at the screen. Resting When lying down and resting, avoid positions that are most painful for you. If you have pain with activities such as sitting, bending, stooping, or squatting, lie in a position in which your body does not bend very much. For example, avoid curling up on your side with your arms and knees near your chest (fetal position). If you have pain with activities such as standing for a long time or reaching with your arms, lie with your spine in a neutral position and bend your knees slightly. Try the following positions: Lying on your side with a pillow between your knees. Lying on your back with a pillow under your knees. Lifting  When lifting objects, keep your feet at least shoulder width apart and tighten your abdominal muscles. Bend your knees and hips and keep your spine neutral. It is important to lift using the strength of your legs, not your back. Do not lock your knees straight out. Always ask for help to lift heavy or awkward objects. This information is not intended to replace advice given to you by your health care provider. Make sure you discuss any questions you have with your health care provider. Document Revised: 08/05/2018 Document Reviewed: 05/05/2018 Elsevier Patient Education  [place].  Rosacea Rosacea is a long-term (chronic) condition that affects the skin of the face, including the cheeks, nose, forehead, and chin. This condition can also affect the eyes. Rosacea causes blood vessels near the surface of the skin to enlarge,  which results in redness. What are the causes? The cause of this condition is not known. Certain triggers can make rosacea worse, including: Hot baths. Exercise. Sunlight. Very hot or cold temperatures. Hot or spicy foods and drinks. Drinking alcohol. Stress. Taking blood pressure medicine. Long-term use of topical steroids on the face. What increases the risk? You are more likely to develop this condition  if you: Are older than 77 years of age. Are a woman. Have light-colored skin (light complexion). Have a family history of rosacea. What are the signs or symptoms? Symptoms of this condition include: Redness of the face. Red bumps or pimples on the face. A red, enlarged nose. Blushing easily. Red lines on the skin. Irritated, burning, or itchy feeling in the eyes. Swollen eyelids. Drainage from the eyes. Feeling like there is something in your eye. How is this diagnosed? This condition is diagnosed with a medical history and physical exam. How is this treated? There is no cure for this condition, but treatment can help to control your symptoms. Your health care provider may recommend that you see a skin specialist (dermatologist). Treatment may include: Medicines that are applied to the skin or taken by mouth (orally). This can include antibiotic medicines. Laser treatment to improve the appearance of the skin. Surgery. This is rare. Your health care provider will also recommend the best way to take care of your skin. Even after your skin improves, you will likely need to continue treatment to prevent your rosacea from coming back. Follow these instructions at home: Skin care Take care of your skin as told by your health care provider. You may be told to do these things: Wash your skin gently two or more times each day. Use mild soap. Use a sunscreen or sunblock with SPF 30 or greater. Use gentle cosmetics that are meant for sensitive skin. Shave with an electric shaver  instead of a blade. Lifestyle Try to keep track of what foods trigger this condition. Avoid any triggers. These may include: Spicy foods. Seafood. Cheese. Hot liquids. Nuts. Chocolate. Iodized salt. Do not drink alcohol. Avoid extremely cold or hot temperatures. Try to reduce your stress. If you need help, talk with your health care provider. When you exercise, do these things to stay cool: Limit sun exposure to your face. Use a fan. Do shorter and more frequent intervals of exercise. General instructions Take and apply over-the-counter and prescription medicines only as told by your health care provider. If you were prescribed an antibiotic medicine, apply it or take it as told by your health care provider. Do not stop using the antibiotic even if your condition improves. If your eyelids are affected, apply warm compresses to them. Do this as told by your health care provider. Keep all follow-up visits as told by your health care provider. This is important. Contact a health care provider if: Your symptoms get worse. Your symptoms do not improve after 2 months of treatment. You have new symptoms. You have any changes in vision or you have problems with your eyes, such as redness or itching. You feel depressed. You lose your appetite. You have trouble concentrating. Summary Rosacea is a long-term (chronic) condition that affects the skin of the face, including the cheeks, nose, forehead, and chin. Take care of your skin as told by your health care provider. Take and apply over-the-counter and prescription medicines only as told by your health care provider. Contact a health care provider if your symptoms get worse or if you have any changes in vision or other problems with your eyes, such as redness or itching. Keep all follow-up visits as told by your health care provider. This is important. This information is not intended to replace advice given to you by your health care  provider. Make sure you discuss any questions you have with your health care provider. Document Revised: 01/13/2021 Document Reviewed: 01/13/2021  [employer]vier Patient Education  2023 [employer]  

## 2021-11-18 DIAGNOSIS — M9903 Segmental and somatic dysfunction of lumbar region: Secondary | ICD-10-CM | POA: Diagnosis not present

## 2021-11-18 DIAGNOSIS — M5416 Radiculopathy, lumbar region: Secondary | ICD-10-CM | POA: Diagnosis not present

## 2021-11-18 DIAGNOSIS — M5136 Other intervertebral disc degeneration, lumbar region: Secondary | ICD-10-CM | POA: Diagnosis not present

## 2021-11-18 DIAGNOSIS — M62830 Muscle spasm of back: Secondary | ICD-10-CM | POA: Diagnosis not present

## 2021-11-26 ENCOUNTER — Ambulatory Visit: Payer: Medicare HMO | Attending: Internal Medicine

## 2021-11-26 DIAGNOSIS — M533 Sacrococcygeal disorders, not elsewhere classified: Secondary | ICD-10-CM | POA: Insufficient documentation

## 2021-11-26 DIAGNOSIS — M5459 Other low back pain: Secondary | ICD-10-CM | POA: Diagnosis not present

## 2021-11-26 DIAGNOSIS — M5416 Radiculopathy, lumbar region: Secondary | ICD-10-CM | POA: Diagnosis not present

## 2021-11-26 DIAGNOSIS — M5137 Other intervertebral disc degeneration, lumbosacral region: Secondary | ICD-10-CM | POA: Diagnosis not present

## 2021-11-26 DIAGNOSIS — M5417 Radiculopathy, lumbosacral region: Secondary | ICD-10-CM | POA: Diagnosis not present

## 2021-11-26 DIAGNOSIS — M25552 Pain in left hip: Secondary | ICD-10-CM | POA: Insufficient documentation

## 2021-11-26 DIAGNOSIS — M5432 Sciatica, left side: Secondary | ICD-10-CM | POA: Insufficient documentation

## 2021-11-26 DIAGNOSIS — M1610 Unilateral primary osteoarthritis, unspecified hip: Secondary | ICD-10-CM | POA: Diagnosis not present

## 2021-11-26 NOTE — Therapy (Signed)
[facility] [address] [place], [address] Phone: [phone]   Fax:  [phone]  Physical Therapy Evaluation  Patient Details  Name: Stacey Munoz MRN: [id number] Date of Birth: 1945/10/26 Referring Provider (PT): McLean-Scocuzza, Nino Glow, MD   Encounter Date: 11/26/2021   PT End of Session - 11/26/21 0803     Visit Number 1    Number of Visits 17    Date for PT Re-Evaluation 01/22/22    Authorization Type 1    Authorization Time Period 10 progress report    PT Start Time 0803    PT Stop Time 0850    PT Time Calculation (min) 47 min    Activity Tolerance Patient tolerated treatment well    Behavior During Therapy [facility] for tasks assessed/performed             Past Medical History:  Diagnosis Date   COVID-19    11/08/20   Hyperlipidemia    Low serum vitamin D    Osteoporosis     Past Surgical History:  Procedure Laterality Date   CATARACT EXTRACTION W/PHACO Right 09/16/2021   Procedure: CATARACT EXTRACTION PHACO AND INTRAOCULAR LENS PLACEMENT (IOC) RIGHT 3.80 00:36.0;  Surgeon: Birder Robson, MD;  Location: [place];  Service: Ophthalmology;  Laterality: Right;  needs to have an AM appointment   CATARACT EXTRACTION W/PHACO Left 09/30/2021   Procedure: CATARACT EXTRACTION PHACO AND INTRAOCULAR LENS PLACEMENT (IOC) LEFT 5.17 00:33.9;  Surgeon: Birder Robson, MD;  Location: [place];  Service: Ophthalmology;  Laterality: Left;   COLONOSCOPY WITH PROPOFOL N/A 05/20/2021   Procedure: COLONOSCOPY WITH PROPOFOL;  Surgeon: Lucilla Lame, MD;  Location: [facility] ENDOSCOPY;  Service: Endoscopy;  Laterality: N/A;   FACIAL COSMETIC SURGERY     FRACTURE SURGERY     KNEE ARTHROSCOPY Right 01/2011   torn meniscus   ROOT CANAL Left    04/2021 surgery schedule   TRIGGER FINGER RELEASE Right 08/2014   TUBAL LIGATION     WRIST FRACTURE SURGERY     left    There were no vitals filed  for this visit.    Subjective Assessment - 11/26/21 0804     Subjective L posterior hip: 2/10 currently (pt standing); 9/10 at worst for the past 3 months.    Pertinent History Low back pain. Feels like L hip higher. Worked on realigning at [employer]. The out of alignment is pinching L LE nerve (L5 dermatome). L LE pain is now localized to L posterior hip. Going to a massage therapist for her hip. Uses a back brace when she is long distance driving, and performing standing chores which help. Has a tendency to rotate her L hip forward. Normally walks 3-4 miles a day which aggravates her symptoms. Currently walks 10 minutes at a time at home now. Ices her L low back after walking. Does yoga regularly. Feels like she is sitting on her B iscial tuberosities when sitting down. Stops every 2 hours of long distance driving to stretch and take a break. Back does not really hurt, pain is mainly at L posterior hip. Has a stack of HEP at home which include B knee to chest, bridges, S/L hip abduction. Yoga: downward dog, warrior poses, lower trunk rotation, cobra pose (prone press-ups), prayer stretch, cat/camel, quadruped alternating shoulder flexion with contralatearl hip extension, quaduped hip extensions. L vastus lateralis feels more tight than the R. Could not sleep at night when L hip was  bothering her. Pain began 2-3 years ago. Recently started horse back riding again but had to give it back. Also has osteoporosis. Has problems at L4/L5.    Patient Stated Goals Be able to stand, walk, sit so that her body is alight. Would like to go back to hiking 3-4 miles. Not to make her pain worse.    Currently in Pain? Yes    Pain Score 2     Pain Location Hip    Pain Orientation Left    Pain Descriptors / Indicators Constant;Sore    Pain Type Chronic pain    Pain Radiating Towards (uesd to be L L5 dermatome but now localised to L posterior hip)    Pain Onset More than a month ago    Pain Frequency Occasional     Aggravating Factors  Walking about 3-4 miles.    Pain Relieving Factors Back brace                [facility] PT Assessment - 11/26/21 0822       Assessment   Medical Diagnosis M54.16 (ICD-10-CM) - Lumbar radiculopathy  M53.3 (ICD-10-CM) - Pain in sacrum  M51.37 (ICD-10-CM) - DDD (degenerative disc disease), lumbosacral  M16.10 (ICD-10-CM) - Hip arthritis  M54.32 (ICD-10-CM) - Sciatica of left side    Referring Provider (PT) McLean-Scocuzza, Nino Glow, MD    Onset Date/Surgical Date 11/06/21   Date PT referral signed. Chronic condition   Prior Therapy Chiropractic and massage therapy with positive results. PT for knee with positive results.      Precautions   Precaution Comments Osteoporosis      Restrictions   Other Position/Activity Restrictions No known restrictions      Observation/Other Assessments   Focus on Therapeutic Outcomes (FOTO)  Lumbar spine FOTO 53      Posture/Postural Control   Posture Comments R hip in slight ER, R shoulder higher, decreased lumbar lordosis, slight R lateral trunk side bend around L2/3 area, L anterior pelvic rotation, R greater trochanter higher, R knee higher      AROM   Lumbar Flexion full with reproduction of L posterior hip pain.    Lumbar Extension WFL    Lumbar - Right Side Bend WFL    Lumbar - Left Side [facility] with L hip pain wiht over pressure    Lumbar - Right Rotation WFL with L hip symptoms    Lumbar - Left Rotation WFL with decreased L hip symptoms.      PROM   Right Hip Internal Rotation  28   degrees   Left Hip Internal Rotation  36   degrees     Strength   Right Hip Flexion 4/5    Right Hip Extension 4-/5    Right Hip ABduction 4/5    Left Hip Flexion 4-/5    Left Hip Extension 4-/5    Left Hip ABduction 4-/5    Right Knee Flexion 4/5    Right Knee Extension 5/5    Left Knee Flexion 4/5    Left Knee Extension 5/5      Special Tests   Other special tests (+) repeated flexion test with reproduction of posterior L  hip pain.  (+) Slump L and R LE. Long sit test suggests anterior nutation L innominate      Ambulation/Gait   Gait Comments decreased trunk rotation, decreased stance R LE  Objective measurements completed on examination: See above findings.        Sleeps on L side     Also working with chiropractor 2x/month for alignment and massage therapist every 3-4 weeks.     Response to treatment Pt tolerated session well without aggravation of symptoms.     Clinical impression Pt is a 76 year old female who came to physical therapy secondary to L low back/posterior hip pain. She also presents with hx of L L5 radiating symptoms, altered posture, reproduction of symptoms with repeated flexion, L side bending as well as R lumbar rotation; positive special tests suggesting lumbopelvic involvement as well as neural tension; B hip weakness, decreased R hip IR ROM, and difficulty performing tasks which involve prolonged walking, sitting, and standing due to L low back/posterior hip pain. Pt will benefit from skilled physical therapy services to address the aforementioned deficits.                   PT Education - 11/26/21 1024     Education Details Plan of care    Person(s) Educated Patient    Methods Explanation    Comprehension Verbalized understanding              PT Short Term Goals - 11/26/21 1015       PT SHORT TERM GOAL #1   Title Patient will be independent with her initial HEP to decrease pain, improve strength and ability to tolerate prolonged sitting, standing, and walking.    Time 3    Period Weeks    Status New    Target Date 12/18/21               PT Long Term Goals - 11/26/21 1016       PT LONG TERM GOAL #1   Title Patient will have a decrease in L low back/posterior hip pain to 2/10 or less at worst to promote ability to perform standing chores, return to hiking, as well as drive longer distances more  comfortably.    Baseline 9/10 L low back/posterior hip pain at worst for the past 3 months (11/26/2021)    Time 8    Period Weeks    Status New    Target Date 01/22/22      PT LONG TERM GOAL #2   Title Patient will improve her hip extension and abduction strength by at least 1/2 MMT grade to promote ability to perform standing tasks more comfortably.    Baseline Hip extension 4-/5 R and L, hip abduction 4/5 R, 4-/5 L (11/26/2021)    Time 8    Period Weeks    Status New    Target Date 01/22/22      PT LONG TERM GOAL #3   Title Patient will improve her FOTO score by at least 10 points as a demonstration of improved function.    Baseline Lumbar Spine FOTO 53 (11/26/2021)    Time 8    Period Weeks    Status New    Target Date 01/22/22      PT LONG TERM GOAL #4   Title Pt will be able to return to hiking about 3-4 miles with 2/10 L low back/posterior hip pain or less to promote fitness.    Baseline Currently walks 10 minutes at a time at home (11/26/2021)    Time 8    Period Weeks    Status New    Target Date 01/22/22  Plan - 11/26/21 1008     Clinical Impression Statement Pt is a 76 year old female who came to physical therapy secondary to L low back/posterior hip pain. She also presents with hx of L L5 radiating symptoms, altered posture, reproduction of symptoms with repeated flexion, L side bending as well as R lumbar rotation; positive special tests suggesting lumbopelvic involvement as well as neural tension; B hip weakness, decreased R hip IR ROM, and difficulty performing tasks which involve prolonged walking, sitting, and standing due to L low back/posterior hip pain. Pt will benefit from skilled physical therapy services to address the aforementioned deficits.    Personal Factors and Comorbidities Comorbidity 2;Age;Time since onset of injury/illness/exacerbation    Comorbidities Age, osteoporosis    Examination-Activity Limitations  Stand;Sit;Bend;Lift;Locomotion Level    Stability/Clinical Decision Making Stable/Uncomplicated    Clinical Decision Making Low    Clinical Presentation due to: stable symptoms    Rehab Potential Fair    Clinical Impairments Affecting Rehab Potential (+) motivated; (-) age, osteoporosis, chronicity of condition    PT Frequency 2x / week    PT Duration 8 weeks    PT Treatment/Interventions Therapeutic exercise;Therapeutic activities;Neuromuscular re-education;Patient/family education;Manual techniques;Dry needling;Aquatic Therapy;Electrical Stimulation;Iontophoresis '4mg'$ /ml Dexamethasone;Traction    PT Next Visit Plan Posture, trunk and hip strengthening, extension, lumbopelvic stability and control, manual techniques, modalities PRN    Consulted and Agree with Plan of Care Patient             Patient will benefit from skilled therapeutic intervention in order to improve the following deficits and impairments:  Pain, Postural dysfunction, Improper body mechanics, Decreased strength  Visit Diagnosis: Pain in left hip - Plan: PT plan of care cert/re-cert  Other low back pain - Plan: PT plan of care cert/re-cert  Radiculopathy, lumbosacral region - Plan: PT plan of care cert/re-cert     Problem List Patient Active Problem List   Diagnosis Date Noted   DDD (degenerative disc disease), lumbosacral 11/06/2021   Lumbar radiculopathy 11/06/2021   Uterine fibroid 11/06/2021   Hip arthritis 11/06/2021   Personal history of colonic polyps    Polyp of ascending colon    Positive colorectal cancer screening using Cologuard test 05/12/2021   Patient travels 12/27/2020   Arthritis of lumbar spine 10/01/2020   Left lumbar radiculopathy 04/02/2020   Prediabetes 04/03/2019   Thyroid nodule 03/22/2019   Osteoporosis 08/24/2017   DNR (do not resuscitate) discussion 05/23/2015   Diminished hearing 01/22/2015   Low hemoglobin 10/15/2014   Vitamin D deficiency 10/15/2014   Hyperlipidemia  10/15/2014   Retained orthopedic hardware 04/14/2013   Triggering of digit 02/03/2013   Closed fracture of lower end of left radius with malunion 02/03/2013    Aydee Mcnew, PT 11/26/2021, 10:31 AM  [facility] [address], [place], [address] Phone: [phone]   Fax:  [phone]  Name: Stacey Munoz MRN: [id number] Date of Birth: 03/09/1946

## 2021-12-04 DIAGNOSIS — M9903 Segmental and somatic dysfunction of lumbar region: Secondary | ICD-10-CM | POA: Diagnosis not present

## 2021-12-04 DIAGNOSIS — M5136 Other intervertebral disc degeneration, lumbar region: Secondary | ICD-10-CM | POA: Diagnosis not present

## 2021-12-04 DIAGNOSIS — M5416 Radiculopathy, lumbar region: Secondary | ICD-10-CM | POA: Diagnosis not present

## 2021-12-04 DIAGNOSIS — M62830 Muscle spasm of back: Secondary | ICD-10-CM | POA: Diagnosis not present

## 2021-12-09 ENCOUNTER — Ambulatory Visit: Payer: Medicare HMO

## 2021-12-09 DIAGNOSIS — M1610 Unilateral primary osteoarthritis, unspecified hip: Secondary | ICD-10-CM | POA: Diagnosis not present

## 2021-12-09 DIAGNOSIS — M5417 Radiculopathy, lumbosacral region: Secondary | ICD-10-CM

## 2021-12-09 DIAGNOSIS — M25552 Pain in left hip: Secondary | ICD-10-CM

## 2021-12-09 DIAGNOSIS — M5459 Other low back pain: Secondary | ICD-10-CM

## 2021-12-09 DIAGNOSIS — M5416 Radiculopathy, lumbar region: Secondary | ICD-10-CM | POA: Diagnosis not present

## 2021-12-09 DIAGNOSIS — M533 Sacrococcygeal disorders, not elsewhere classified: Secondary | ICD-10-CM | POA: Diagnosis not present

## 2021-12-09 DIAGNOSIS — M5432 Sciatica, left side: Secondary | ICD-10-CM | POA: Diagnosis not present

## 2021-12-09 DIAGNOSIS — M5137 Other intervertebral disc degeneration, lumbosacral region: Secondary | ICD-10-CM | POA: Diagnosis not present

## 2021-12-09 NOTE — Therapy (Signed)
[facility] [address], [place], [address] Phone: [phone]   Fax:  [phone]  Physical TherapyTreatment  Patient Details  Name: Stacey Munoz MRN: [id number] Date of Birth: Sep 13, 1945 Referring Provider (PT): McLean-Scocuzza, Nino Glow, MD   Encounter Date: 12/09/2021   PT End of Session - 12/09/21 0834     Visit Number 2    Number of Visits 17    Date for PT Re-Evaluation 01/22/22    Authorization Type 2    Authorization Time Period 10 progress report    PT Start Time 0845    PT Stop Time 0925    PT Time Calculation (min) 40 min    Activity Tolerance Patient tolerated treatment well    Behavior During Therapy [facility] for tasks assessed/performed             Past Medical History:  Diagnosis Date   COVID-19    11/08/20   Hyperlipidemia    Low serum vitamin D    Osteoporosis     Past Surgical History:  Procedure Laterality Date   CATARACT EXTRACTION W/PHACO Right 09/16/2021   Procedure: CATARACT EXTRACTION PHACO AND INTRAOCULAR LENS PLACEMENT (IOC) RIGHT 3.80 00:36.0;  Surgeon: Birder Robson, MD;  Location: [place];  Service: Ophthalmology;  Laterality: Right;  needs to have an AM appointment   CATARACT EXTRACTION W/PHACO Left 09/30/2021   Procedure: CATARACT EXTRACTION PHACO AND INTRAOCULAR LENS PLACEMENT (IOC) LEFT 5.17 00:33.9;  Surgeon: Birder Robson, MD;  Location: [place];  Service: Ophthalmology;  Laterality: Left;   COLONOSCOPY WITH PROPOFOL N/A 05/20/2021   Procedure: COLONOSCOPY WITH PROPOFOL;  Surgeon: Lucilla Lame, MD;  Location: [facility] ENDOSCOPY;  Service: Endoscopy;  Laterality: N/A;   FACIAL COSMETIC SURGERY     FRACTURE SURGERY     KNEE ARTHROSCOPY Right 01/2011   torn meniscus   ROOT CANAL Left    04/2021 surgery schedule   TRIGGER FINGER RELEASE Right 08/2014   TUBAL LIGATION     WRIST FRACTURE SURGERY     left    There were no vitals filed  for this visit.    Subjective Assessment - 12/09/21 0845     Subjective Pt reports some pain in sitting on her ischial tuberosities. Seeing massage therapist tomorrow. Piriformis region pain on LLE.    Pertinent History Low back pain. Feels like L hip higher. Worked on realigning at [employer]. The out of alignment is pinching L LE nerve (L5 dermatome). L LE pain is now localized to L posterior hip. Going to a massage therapist for her hip. Uses a back brace when she is long distance driving, and performing standing chores which help. Has a tendency to rotate her L hip forward. Normally walks 3-4 miles a day which aggravates her symptoms. Currently walks 10 minutes at a time at home now. Ices her L low back after walking. Does yoga regularly. Feels like she is sitting on her B iscial tuberosities when sitting down. Stops every 2 hours of long distance driving to stretch and take a break. Back does not really hurt, pain is mainly at L posterior hip. Has a stack of HEP at home which include B knee to chest, bridges, S/L hip abduction. Yoga: downward dog, warrior poses, lower trunk rotation, cobra pose (prone press-ups), prayer stretch, cat/camel, quadruped alternating shoulder flexion with contralatearl hip extension, quaduped hip extensions. L vastus lateralis feels more tight than the R. Could not sleep at night when  L hip was bothering her. Pain began 2-3 years ago. Recently started horse back riding again but had to give it back. Also has osteoporosis. Has problems at L4/L5.    Limitations Sitting    Patient Stated Goals Be able to stand, walk, sit so that her body is alight. Would like to go back to hiking 3-4 miles. Not to make her pain worse.    Currently in Pain? Yes    Pain Score 2     Pain Location Hip    Pain Orientation Left    Pain Onset More than a month ago    Pain Frequency Occasional              Objective measurements completed on examination: See above findings.     Sleeps on L side   Also working with chiropractor 2x/month for alignment and massage therapist every 3-4 weeks.    TREATMENT: 12/09/2021 Seated physioball roll outs forwards x12, R lateral deviations x12  Hook lying LTR's: x20/direction   Hook lying single knee to chest with L knee to R pec region: 4x30 sec holds.   Hook lying anterior to posterior tilts: 2x12/direction  Hook lying bridges: 2x6, 1-3 sec holds  Alternating bird dogs: 3x8/side. Required max TC's on pelvis for neutral alignment as with R hip extension pt rotates L pelvis anteriorly. Trialed 1/2 bolster on sacrum for proprioceptive feedback with fair success. Education provided to pt on motor control and trialing less R hip extension to prevent L anterior pelvic rotation.         Response to treatment Pt tolerated session well without aggravation of symptoms.     Clinical impression Pt arriving for follow up session after eval. Pt demonstrating good understanding of all exercises with excellent flexibility in LE's. Pt does demonstrate difficulty with motor control tasks with bird dog leading to L pelvic rotation with RLE extension leading to pt's primary concern for PT. Improves with manual facilitation for neutral pelvic alignment. Education provided on at home exercise limiting R hip extension to prevent L pelvic rotation. Pt understanding of motor control of task. Pt will continue to benefit from skilled PT services to progress pain free mobility.        PT Education - 12/09/21 0848     Education Details HEP form/technique with exercise.    Person(s) Educated Patient    Methods Explanation    Comprehension Verbalized understanding               PT Short Term Goals - 11/26/21 1015       PT SHORT TERM GOAL #1   Title Patient will be independent with her initial HEP to decrease pain, improve strength and ability to tolerate prolonged sitting, standing, and walking.    Time 3    Period Weeks     Status New    Target Date 12/18/21               PT Long Term Goals - 11/26/21 1016       PT LONG TERM GOAL #1   Title Patient will have a decrease in L low back/posterior hip pain to 2/10 or less at worst to promote ability to perform standing chores, return to hiking, as well as drive longer distances more comfortably.    Baseline 9/10 L low back/posterior hip pain at worst for the past 3 months (11/26/2021)    Time 8    Period Weeks    Status New  Target Date 01/22/22      PT LONG TERM GOAL #2   Title Patient will improve her hip extension and abduction strength by at least 1/2 MMT grade to promote ability to perform standing tasks more comfortably.    Baseline Hip extension 4-/5 R and L, hip abduction 4/5 R, 4-/5 L (11/26/2021)    Time 8    Period Weeks    Status New    Target Date 01/22/22      PT LONG TERM GOAL #3   Title Patient will improve her FOTO score by at least 10 points as a demonstration of improved function.    Baseline Lumbar Spine FOTO 53 (11/26/2021)    Time 8    Period Weeks    Status New    Target Date 01/22/22      PT LONG TERM GOAL #4   Title Pt will be able to return to hiking about 3-4 miles with 2/10 L low back/posterior hip pain or less to promote fitness.    Baseline Currently walks 10 minutes at a time at home (11/26/2021)    Time 8    Period Weeks    Status New    Target Date 01/22/22                    Plan - 12/09/21 1022     Clinical Impression Statement Pt arriving for follow up session after eval. Pt demonstrating good understanding of all exercises with excellent flexibility in LE's. Pt does demonstrate difficulty with motor control tasks with bird dog leading to L pelvic rotation with RLE extension leading to pt's primary concern for PT. Improves with manual facilitation for neutral pelvic alignment. Education provided on at home exercise limiting R hip extension to prevent L pelvic rotation. Pt understanding of motor  control of task. Pt will continue to benefit from skilled PT services to progress pain free mobility.    Personal Factors and Comorbidities Comorbidity 2;Age;Time since onset of injury/illness/exacerbation    Comorbidities Age, osteoporosis    Examination-Activity Limitations Stand;Sit;Bend;Lift;Locomotion Level    Stability/Clinical Decision Making Stable/Uncomplicated    Clinical Decision Making Low    Rehab Potential Fair    Clinical Impairments Affecting Rehab Potential (+) motivated; (-) age, osteoporosis, chronicity of condition    PT Frequency 2x / week    PT Duration 8 weeks    PT Treatment/Interventions Therapeutic exercise;Therapeutic activities;Neuromuscular re-education;Patient/family education;Manual techniques;Dry needling;Aquatic Therapy;Electrical Stimulation;Iontophoresis '4mg'$ /ml Dexamethasone;Traction    PT Next Visit Plan Posture, trunk and hip strengthening, extension, lumbopelvic stability and control, manual techniques, modalities PRN    Consulted and Agree with Plan of Care Patient              Patient will benefit from skilled therapeutic intervention in order to improve the following deficits and impairments:  Pain, Postural dysfunction, Improper body mechanics, Decreased strength  Visit Diagnosis: Pain in left hip  Other low back pain  Radiculopathy, lumbosacral region     Problem List Patient Active Problem List   Diagnosis Date Noted   DDD (degenerative disc disease), lumbosacral 11/06/2021   Lumbar radiculopathy 11/06/2021   Uterine fibroid 11/06/2021   Hip arthritis 11/06/2021   Personal history of colonic polyps    Polyp of ascending colon    Positive colorectal cancer screening using Cologuard test 05/12/2021   Patient travels 12/27/2020   Arthritis of lumbar spine 10/01/2020   Left lumbar radiculopathy 04/02/2020   Prediabetes 04/03/2019   Thyroid nodule 03/22/2019  Osteoporosis 08/24/2017   DNR (do not resuscitate) discussion  05/23/2015   Diminished hearing 01/22/2015   Low hemoglobin 10/15/2014   Vitamin D deficiency 10/15/2014   Hyperlipidemia 10/15/2014   Retained orthopedic hardware 04/14/2013   Triggering of digit 02/03/2013   Closed fracture of lower end of left radius with malunion 02/03/2013   Salem Caster. Fairly IV, PT, DPT Physical Therapist- [facility]  12/09/2021, 10:29 AM

## 2021-12-16 ENCOUNTER — Ambulatory Visit: Payer: Medicare HMO

## 2021-12-16 DIAGNOSIS — M5432 Sciatica, left side: Secondary | ICD-10-CM | POA: Diagnosis not present

## 2021-12-16 DIAGNOSIS — M5417 Radiculopathy, lumbosacral region: Secondary | ICD-10-CM | POA: Diagnosis not present

## 2021-12-16 DIAGNOSIS — M5416 Radiculopathy, lumbar region: Secondary | ICD-10-CM | POA: Diagnosis not present

## 2021-12-16 DIAGNOSIS — M5137 Other intervertebral disc degeneration, lumbosacral region: Secondary | ICD-10-CM | POA: Diagnosis not present

## 2021-12-16 DIAGNOSIS — M5459 Other low back pain: Secondary | ICD-10-CM

## 2021-12-16 DIAGNOSIS — M25552 Pain in left hip: Secondary | ICD-10-CM

## 2021-12-16 DIAGNOSIS — M533 Sacrococcygeal disorders, not elsewhere classified: Secondary | ICD-10-CM | POA: Diagnosis not present

## 2021-12-16 DIAGNOSIS — M1610 Unilateral primary osteoarthritis, unspecified hip: Secondary | ICD-10-CM | POA: Diagnosis not present

## 2021-12-16 NOTE — Therapy (Signed)
OUTPATIENT PHYSICAL THERAPY TREATMENT NOTE   Patient Name: Stacey Munoz MRN: [id number] DOB:Aug 28, 1945, 76 y.o., female Today's Date: 12/16/2021  PCP: McLean-Scocuzza, Nino Glow, MD REFERRING PROVIDER: McLean-Scocuzza, Nino Glow, MD   PT End of Session - 12/16/21 1419     Visit Number 3    Number of Visits 17    Date for PT Re-Evaluation 01/22/22    Authorization Type 3    Authorization Time Period 10 progress report    PT Start Time 1420    PT Stop Time 1500    PT Time Calculation (min) 40 min    Activity Tolerance Patient tolerated treatment well    Behavior During Therapy [facility] for tasks assessed/performed             Past Medical History:  Diagnosis Date   COVID-19    11/08/20   Hyperlipidemia    Low serum vitamin D    Osteoporosis    Past Surgical History:  Procedure Laterality Date   CATARACT EXTRACTION W/PHACO Right 09/16/2021   Procedure: CATARACT EXTRACTION PHACO AND INTRAOCULAR LENS PLACEMENT ([place]) RIGHT 3.80 00:36.0;  Surgeon: Birder Robson, MD;  Location: [place];  Service: Ophthalmology;  Laterality: Right;  needs to have an AM appointment   CATARACT EXTRACTION W/PHACO Left 09/30/2021   Procedure: CATARACT EXTRACTION PHACO AND INTRAOCULAR LENS PLACEMENT ([facility]) LEFT 5.17 00:33.9;  Surgeon: Birder Robson, MD;  Location: [place];  Service: Ophthalmology;  Laterality: Left;   COLONOSCOPY WITH PROPOFOL N/A 05/20/2021   Procedure: COLONOSCOPY WITH PROPOFOL;  Surgeon: Lucilla Lame, MD;  Location: [facility] ENDOSCOPY;  Service: Endoscopy;  Laterality: N/A;   FACIAL COSMETIC SURGERY     FRACTURE SURGERY     KNEE ARTHROSCOPY Right 01/2011   torn meniscus   ROOT CANAL Left    04/2021 surgery schedule   TRIGGER FINGER RELEASE Right 08/2014   TUBAL LIGATION     WRIST FRACTURE SURGERY     left   Patient Active Problem List   Diagnosis Date Noted   DDD (degenerative disc disease), lumbosacral 11/06/2021   Lumbar radiculopathy 11/06/2021    Uterine fibroid 11/06/2021   Hip arthritis 11/06/2021   Personal history of colonic polyps    Polyp of ascending colon    Positive colorectal cancer screening using Cologuard test 05/12/2021   Patient travels 12/27/2020   Arthritis of lumbar spine 10/01/2020   Left lumbar radiculopathy 04/02/2020   Prediabetes 04/03/2019   Thyroid nodule 03/22/2019   Osteoporosis 08/24/2017   DNR (do not resuscitate) discussion 05/23/2015   Diminished hearing 01/22/2015   Low hemoglobin 10/15/2014   Vitamin D deficiency 10/15/2014   Hyperlipidemia 10/15/2014   Retained orthopedic hardware 04/14/2013   Triggering of digit 02/03/2013   Closed fracture of lower end of left radius with malunion 02/03/2013    REFERRING DIAG: M54.16 (ICD-10-CM) - Lumbar radiculopathy  M53.3 (ICD-10-CM) - Pain in sacrum  M51.37 (ICD-10-CM) - DDD (degenerative disc disease), lumbosacral  M16.10 (ICD-10-CM) - Hip arthritis  M54.32 (ICD-10-CM) - Sciatica of left side  THERAPY DIAG:  Pain in left hip  Other low back pain  Radiculopathy, lumbosacral region  Rationale for Evaluation and Treatment Rehabilitation  PERTINENT HISTORY: Low back pain. Feels like L hip higher. Worked on realigning at [employer]. The out of alignment is pinching L LE nerve (L5 dermatome). L LE pain is now localized to L posterior hip. Going to a massage therapist for her hip. 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 Has a tendency to rotate her L hip forward. Normally walks 3-4 miles a day which aggravates her symptoms. Currently walks 10 minutes at a time at home now. Ices her L low back after walking. Does yoga regularly. Feels like she is sitting on her B iscial tuberosities when sitting down. Stops every 2 hours of long distance driving to stretch and take a break. Back does not really hurt, pain is mainly at L posterior hip. Has a stack of HEP at home which include B knee to chest, bridges,  S/L hip abduction. Yoga: downward dog, warrior poses, lower trunk rotation, cobra pose (prone press-ups), prayer stretch, cat/camel, quadruped alternating shoulder flexion with contralatearl hip extension, quaduped hip extensions. L vastus lateralis feels more tight than the R. Could not sleep at night when L hip was bothering her. Pain began 2-3 years ago. Recently started horse back riding again but had to give it back. Also has osteoporosis. Has problems at L4/L5  PRECAUTIONS: Osteoporosis  SUBJECTIVE: L low back/hip is feeling better. Walked a mile and a half yesterday. Was able to walk 1.5 miles.   PAIN:  Are you having pain? 0/10 currently     TODAY'S TREATMENT:    Objective measurements completed on examination: See above findings.      Sleeps on L side    Also working with chiropractor 2x/month for alignment and massage therapist every 3-4 weeks.      TREATMENT: 12/16/2021  Standing static lunge with contralateral UE assist   L 10x3  Seated trunk flexion to the R 10x5 seconds for 2 sets   Hook lying LTR's: x20/direction   Improved low back comfort level, decreased tightness reported   Hooklying reverse crunch 10x2  Crunches   To the R 5x2  To the L 5x2  SLS with B UE assist   L 10x5 seconds   Good glute med muscle use felt. Decreased L low back pain when cued for proper technique.   Prayer stretch to the R 10x5 seconds    Improved exercise technique, movement at target joints, use of target muscles after mod verbal, visual, tactile cues.                     Response to treatment Pt tolerated session well without aggravation of symptoms.        Clinical impression  Worked on improving glute and trunk strength to decrease L low back pressure during standing tasks. Weak single leg glute med stance strength observed. Pt tolerated session well without aggravation of symptoms. Pt will benefit from continued skilled physical therapy services to  decrease pain, improve strength and function.         PATIENT EDUCATION: Education details: there-ex, HEP Person educated: Patient Education method: Explanation, Demonstration, Tactile cues, Verbal cues, and Handouts Education comprehension: verbalized understanding and returned demonstration   HOME EXERCISE PROGRAM: Access Code: [email or web address] URL: [email or web address] Date: 12/16/2021 Prepared by: Joneen Boers  Exercises - Bilateral Bent Leg Lift  - 1 x daily - 7 x weekly - 2 sets - 10 reps - Standing Single Leg Stance with Unilateral Counter Support  - 2 x daily - 7 x weekly - 1 sets - 10 reps - 5 seconds hold   PT Short Term Goals - 11/26/21 1015       PT SHORT TERM GOAL #1   Title Patient will be independent with her initial HEP to decrease pain, improve strength and ability to  tolerate prolonged sitting, standing, and walking.    Time 3    Period Weeks    Status New    Target Date 12/18/21              PT Long Term Goals - 11/26/21 1016       PT LONG TERM GOAL #1   Title Patient will have a decrease in L low back/posterior hip pain to 2/10 or less at worst to promote ability to perform standing chores, return to hiking, as well as drive longer distances more comfortably.    Baseline 9/10 L low back/posterior hip pain at worst for the past 3 months (11/26/2021)    Time 8    Period Weeks    Status New    Target Date 01/22/22      PT LONG TERM GOAL #2   Title Patient will improve her hip extension and abduction strength by at least 1/2 MMT grade to promote ability to perform standing tasks more comfortably.    Baseline Hip extension 4-/5 R and L, hip abduction 4/5 R, 4-/5 L (11/26/2021)    Time 8    Period Weeks    Status New    Target Date 01/22/22      PT LONG TERM GOAL #3   Title Patient will improve her FOTO score by at least 10 points as a demonstration of improved function.    Baseline Lumbar Spine FOTO 53 (11/26/2021)    Time 8    Period  Weeks    Status New    Target Date 01/22/22      PT LONG TERM GOAL #4   Title Pt will be able to return to hiking about 3-4 miles with 2/10 L low back/posterior hip pain or less to promote fitness.    Baseline Currently walks 10 minutes at a time at home (11/26/2021)    Time 8    Period Weeks    Status New    Target Date 01/22/22              Plan - 12/16/21 1442     Clinical Impression Statement Worked on improving glute and trunk strength to decrease L low back pressure during standing tasks. Weak single leg glute med stance strength observed. Pt tolerated session well without aggravation of symptoms. Pt will benefit from continued skilled physical therapy services to decrease pain, improve strength and function.    Personal Factors and Comorbidities Comorbidity 2;Age;Time since onset of injury/illness/exacerbation    Comorbidities Age, osteoporosis    Examination-Activity Limitations Stand;Sit;Bend;Lift;Locomotion Level    Stability/Clinical Decision Making Stable/Uncomplicated    Clinical Decision Making Low    Rehab Potential Fair    Clinical Impairments Affecting Rehab Potential (+) motivated; (-) age, osteoporosis, chronicity of condition    PT Frequency 2x / week    PT Duration 8 weeks    PT Treatment/Interventions Therapeutic exercise;Therapeutic activities;Neuromuscular re-education;Patient/family education;Manual techniques;Dry needling;Aquatic Therapy;Electrical Stimulation;Iontophoresis '4mg'$ /ml Dexamethasone;Traction    PT Next Visit Plan Posture, trunk and hip strengthening, extension, lumbopelvic stability and control, manual techniques, modalities PRN    Consulted and Agree with Plan of Care Patient              Joneen Boers PT, DPT  12/16/2021, 4:47 PM

## 2021-12-18 DIAGNOSIS — M9903 Segmental and somatic dysfunction of lumbar region: Secondary | ICD-10-CM | POA: Diagnosis not present

## 2021-12-18 DIAGNOSIS — M5136 Other intervertebral disc degeneration, lumbar region: Secondary | ICD-10-CM | POA: Diagnosis not present

## 2021-12-18 DIAGNOSIS — M5416 Radiculopathy, lumbar region: Secondary | ICD-10-CM | POA: Diagnosis not present

## 2021-12-18 DIAGNOSIS — M62830 Muscle spasm of back: Secondary | ICD-10-CM | POA: Diagnosis not present

## 2021-12-23 ENCOUNTER — Ambulatory Visit: Payer: Medicare HMO

## 2021-12-23 DIAGNOSIS — M533 Sacrococcygeal disorders, not elsewhere classified: Secondary | ICD-10-CM | POA: Diagnosis not present

## 2021-12-23 DIAGNOSIS — M25552 Pain in left hip: Secondary | ICD-10-CM | POA: Diagnosis not present

## 2021-12-23 DIAGNOSIS — M5417 Radiculopathy, lumbosacral region: Secondary | ICD-10-CM | POA: Diagnosis not present

## 2021-12-23 DIAGNOSIS — M5432 Sciatica, left side: Secondary | ICD-10-CM | POA: Diagnosis not present

## 2021-12-23 DIAGNOSIS — M1610 Unilateral primary osteoarthritis, unspecified hip: Secondary | ICD-10-CM | POA: Diagnosis not present

## 2021-12-23 DIAGNOSIS — M5137 Other intervertebral disc degeneration, lumbosacral region: Secondary | ICD-10-CM | POA: Diagnosis not present

## 2021-12-23 DIAGNOSIS — M5416 Radiculopathy, lumbar region: Secondary | ICD-10-CM | POA: Diagnosis not present

## 2021-12-23 DIAGNOSIS — M5459 Other low back pain: Secondary | ICD-10-CM | POA: Diagnosis not present

## 2021-12-23 NOTE — Therapy (Signed)
OUTPATIENT PHYSICAL THERAPY TREATMENT NOTE   Patient Name: Stacey Munoz MRN: [id number] DOB:1945/11/14, 76 y.o., female Today's Date: 12/23/2021  PCP: McLean-Scocuzza, Nino Glow, MD REFERRING PROVIDER: McLean-Scocuzza, Nino Glow, MD   PT End of Session - 12/23/21 0803     Visit Number 4    Number of Visits 17    Date for PT Re-Evaluation 01/22/22    Authorization Type 4    Authorization Time Period 10 progress report    PT Start Time 0803    PT Stop Time 0845    PT Time Calculation (min) 42 min    Activity Tolerance Patient tolerated treatment well    Behavior During Therapy [facility] for tasks assessed/performed              Past Medical History:  Diagnosis Date   COVID-19    11/08/20   Hyperlipidemia    Low serum vitamin D    Osteoporosis    Past Surgical History:  Procedure Laterality Date   CATARACT EXTRACTION W/PHACO Right 09/16/2021   Procedure: CATARACT EXTRACTION PHACO AND INTRAOCULAR LENS PLACEMENT ([place]) RIGHT 3.80 00:36.0;  Surgeon: Birder Robson, MD;  Location: [place];  Service: Ophthalmology;  Laterality: Right;  needs to have an AM appointment   CATARACT EXTRACTION W/PHACO Left 09/30/2021   Procedure: CATARACT EXTRACTION PHACO AND INTRAOCULAR LENS PLACEMENT ([facility]) LEFT 5.17 00:33.9;  Surgeon: Birder Robson, MD;  Location: [place];  Service: Ophthalmology;  Laterality: Left;   COLONOSCOPY WITH PROPOFOL N/A 05/20/2021   Procedure: COLONOSCOPY WITH PROPOFOL;  Surgeon: Lucilla Lame, MD;  Location: [facility] ENDOSCOPY;  Service: Endoscopy;  Laterality: N/A;   FACIAL COSMETIC SURGERY     FRACTURE SURGERY     KNEE ARTHROSCOPY Right 01/2011   torn meniscus   ROOT CANAL Left    04/2021 surgery schedule   TRIGGER FINGER RELEASE Right 08/2014   TUBAL LIGATION     WRIST FRACTURE SURGERY     left   Patient Active Problem List   Diagnosis Date Noted   DDD (degenerative disc disease), lumbosacral 11/06/2021   Lumbar radiculopathy 11/06/2021    Uterine fibroid 11/06/2021   Hip arthritis 11/06/2021   Personal history of colonic polyps    Polyp of ascending colon    Positive colorectal cancer screening using Cologuard test 05/12/2021   Patient travels 12/27/2020   Arthritis of lumbar spine 10/01/2020   Left lumbar radiculopathy 04/02/2020   Prediabetes 04/03/2019   Thyroid nodule 03/22/2019   Osteoporosis 08/24/2017   DNR (do not resuscitate) discussion 05/23/2015   Diminished hearing 01/22/2015   Low hemoglobin 10/15/2014   Vitamin D deficiency 10/15/2014   Hyperlipidemia 10/15/2014   Retained orthopedic hardware 04/14/2013   Triggering of digit 02/03/2013   Closed fracture of lower end of left radius with malunion 02/03/2013    REFERRING DIAG: M54.16 (ICD-10-CM) - Lumbar radiculopathy  M53.3 (ICD-10-CM) - Pain in sacrum  M51.37 (ICD-10-CM) - DDD (degenerative disc disease), lumbosacral  M16.10 (ICD-10-CM) - Hip arthritis  M54.32 (ICD-10-CM) - Sciatica of left side  THERAPY DIAG:  Pain in left hip  Other low back pain  Radiculopathy, lumbosacral region  Rationale for Evaluation and Treatment Rehabilitation  PERTINENT HISTORY: Low back pain. Feels like L hip higher. Worked on realigning at [employer]. The out of alignment is pinching L LE nerve (L5 dermatome). L LE pain is now localized to L posterior hip. Going to a massage therapist for her hip. 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 Has a tendency to rotate her L hip forward. Normally walks 3-4 miles a day which aggravates her symptoms. Currently walks 10 minutes at a time at home now. Ices her L low back after walking. Does yoga regularly. Feels like she is sitting on her B iscial tuberosities when sitting down. Stops every 2 hours of long distance driving to stretch and take a break. Back does not really hurt, pain is mainly at L posterior hip. Has a stack of HEP at home which include B knee to chest, bridges,  S/L hip abduction. Yoga: downward dog, warrior poses, lower trunk rotation, cobra pose (prone press-ups), prayer stretch, cat/camel, quadruped alternating shoulder flexion with contralatearl hip extension, quaduped hip extensions. L vastus lateralis feels more tight than the R. Could not sleep at night when L hip was bothering her. Pain began 2-3 years ago. Recently started horse back riding again but had to give it back. Also has osteoporosis. Has problems at L4/L5  PRECAUTIONS: Osteoporosis  SUBJECTIVE: L low back/hip is good. Notices L hip more forward or up compared to the R but no pain. Walked about 1.5 miles a day, not problem with her back.     PAIN:  Are you having pain? 0/10 currently     TODAY'S TREATMENT:    Objective measurements completed on examination: See above findings.      Sleeps on L side    Also working with chiropractor 2x/month for alignment and massage therapist every 3-4 weeks.      TREATMENT: 12/23/2021  Hook lying LTR's: x20/direction   Improved low back comfort level, decreased tightness reported  Crunches  To the R 10x  Hooklying reverse crunch 10x2  Bridge with posterior pelvic tilt 10x2  Bridge with march 5x each LE  L posterior hip symptoms when pt puts her L foot down. Eases with rest  Supine hip extension isometrics in L SKTC position, 10x5 seconds   Prone glute and quad set  R 10x5 seconds for 3 sets  L 10x5 seconds for 3 sets   Plank 10 seconds, then 20 seconds, 30 seconds  PNF chops to the R, red band 5x5 seconds   L posterior hip tension    PNF chops to the L, red band 10x5 seconds    Standing B low rows red band 10x5 seconds for 3 sets   Body feels more balanced per pt.   B anterior thighs feel tight after 3 sets of 10 per pt.    Seated trunk flexion to the R 10x10 seconds         Improved exercise technique, movement at target joints, use of target muscles after mod verbal, visual, tactile cues.             Response to treatment Pt tolerated session well without aggravation of symptoms. Improved flexibility after session reported.        Clinical impression  Worked on improving glute and trunk strength to decrease L low back pressure during standing tasks. Pt tolerated session well without aggravation of symptoms. Pt will benefit from continued skilled physical therapy services to decrease pain, improve strength and function.         PATIENT EDUCATION: Education details: there-ex, HEP Person educated: Patient Education method: Explanation, Demonstration, Tactile cues, Verbal cues, and Handouts Education comprehension: verbalized understanding and returned demonstration   HOME EXERCISE PROGRAM: Access Code: [email or web address] URL: [email or web address] Date: 12/16/2021 Prepared by: Joneen Boers  Exercises - Bilateral Bent Leg  Lift  - 1 x daily - 7 x weekly - 2 sets - 10 reps - Standing Single Leg Stance with Unilateral Counter Support  - 2 x daily - 7 x weekly - 1 sets - 10 reps - 5 seconds hold  - Prone Quadriceps Set  - 1 x daily - 7 x weekly - 3 sets - 10 reps   PT Short Term Goals - 11/26/21 1015       PT SHORT TERM GOAL #1   Title Patient will be independent with her initial HEP to decrease pain, improve strength and ability to tolerate prolonged sitting, standing, and walking.    Time 3    Period Weeks    Status New    Target Date 12/18/21              PT Long Term Goals - 11/26/21 1016       PT LONG TERM GOAL #1   Title Patient will have a decrease in L low back/posterior hip pain to 2/10 or less at worst to promote ability to perform standing chores, return to hiking, as well as drive longer distances more comfortably.    Baseline 9/10 L low back/posterior hip pain at worst for the past 3 months (11/26/2021)    Time 8    Period Weeks    Status New    Target Date 01/22/22      PT LONG TERM GOAL #2   Title Patient will improve her hip extension and  abduction strength by at least 1/2 MMT grade to promote ability to perform standing tasks more comfortably.    Baseline Hip extension 4-/5 R and L, hip abduction 4/5 R, 4-/5 L (11/26/2021)    Time 8    Period Weeks    Status New    Target Date 01/22/22      PT LONG TERM GOAL #3   Title Patient will improve her FOTO score by at least 10 points as a demonstration of improved function.    Baseline Lumbar Spine FOTO 53 (11/26/2021)    Time 8    Period Weeks    Status New    Target Date 01/22/22      PT LONG TERM GOAL #4   Title Pt will be able to return to hiking about 3-4 miles with 2/10 L low back/posterior hip pain or less to promote fitness.    Baseline Currently walks 10 minutes at a time at home (11/26/2021)    Time 8    Period Weeks    Status New    Target Date 01/22/22              Plan - 12/23/21 0757     Clinical Impression Statement Worked on improving glute and trunk strength to decrease L low back pressure during standing tasks. Pt tolerated session well without aggravation of symptoms. Pt will benefit from continued skilled physical therapy services to decrease pain, improve strength and function.    Personal Factors and Comorbidities Comorbidity 2;Age;Time since onset of injury/illness/exacerbation    Comorbidities Age, osteoporosis    Examination-Activity Limitations Stand;Sit;Bend;Lift;Locomotion Level    Stability/Clinical Decision Making Stable/Uncomplicated    Rehab Potential Fair    Clinical Impairments Affecting Rehab Potential (+) motivated; (-) age, osteoporosis, chronicity of condition    PT Frequency 2x / week    PT Duration 8 weeks    PT Treatment/Interventions Therapeutic exercise;Therapeutic activities;Neuromuscular re-education;Patient/family education;Manual techniques;Dry needling;Aquatic Therapy;Electrical Stimulation;Iontophoresis '4mg'$ /ml Dexamethasone;Traction    PT Next  Visit Plan Posture, trunk and hip strengthening, extension, lumbopelvic stability  and control, manual techniques, modalities PRN    Consulted and Agree with Plan of Care Patient               Joneen Boers PT, DPT  12/23/2021, 12:30 PM

## 2021-12-30 ENCOUNTER — Ambulatory Visit: Payer: Medicare HMO | Attending: Internal Medicine

## 2021-12-30 DIAGNOSIS — M25552 Pain in left hip: Secondary | ICD-10-CM | POA: Diagnosis not present

## 2021-12-30 DIAGNOSIS — M5417 Radiculopathy, lumbosacral region: Secondary | ICD-10-CM

## 2021-12-30 DIAGNOSIS — M5459 Other low back pain: Secondary | ICD-10-CM | POA: Diagnosis not present

## 2021-12-30 NOTE — Therapy (Signed)
OUTPATIENT PHYSICAL THERAPY TREATMENT NOTE   Patient Name: Stacey Munoz MRN: [id number] DOB:May 26, 1945, 76 y.o., female Today's Date: 12/30/2021  PCP: McLean-Scocuzza, Nino Glow, MD REFERRING PROVIDER: McLean-Scocuzza, Nino Glow, MD   PT End of Session - 12/30/21 1103     Visit Number 5    Number of Visits 17    Date for PT Re-Evaluation 01/22/22    Authorization Type 5    Authorization Time Period 10 progress report    PT Start Time 1104    PT Stop Time 1147    PT Time Calculation (min) 43 min    Activity Tolerance Patient tolerated treatment well    Behavior During Therapy [facility] for tasks assessed/performed               Past Medical History:  Diagnosis Date   COVID-19    11/08/20   Hyperlipidemia    Low serum vitamin D    Osteoporosis    Past Surgical History:  Procedure Laterality Date   CATARACT EXTRACTION W/PHACO Right 09/16/2021   Procedure: CATARACT EXTRACTION PHACO AND INTRAOCULAR LENS PLACEMENT ([place]) RIGHT 3.80 00:36.0;  Surgeon: Birder Robson, MD;  Location: [place];  Service: Ophthalmology;  Laterality: Right;  needs to have an AM appointment   CATARACT EXTRACTION W/PHACO Left 09/30/2021   Procedure: CATARACT EXTRACTION PHACO AND INTRAOCULAR LENS PLACEMENT ([facility]) LEFT 5.17 00:33.9;  Surgeon: Birder Robson, MD;  Location: [place];  Service: Ophthalmology;  Laterality: Left;   COLONOSCOPY WITH PROPOFOL N/A 05/20/2021   Procedure: COLONOSCOPY WITH PROPOFOL;  Surgeon: Lucilla Lame, MD;  Location: [facility] ENDOSCOPY;  Service: Endoscopy;  Laterality: N/A;   FACIAL COSMETIC SURGERY     FRACTURE SURGERY     KNEE ARTHROSCOPY Right 01/2011   torn meniscus   ROOT CANAL Left    04/2021 surgery schedule   TRIGGER FINGER RELEASE Right 08/2014   TUBAL LIGATION     WRIST FRACTURE SURGERY     left   Patient Active Problem List   Diagnosis Date Noted   DDD (degenerative disc disease), lumbosacral 11/06/2021   Lumbar radiculopathy 11/06/2021    Uterine fibroid 11/06/2021   Hip arthritis 11/06/2021   Personal history of colonic polyps    Polyp of ascending colon    Positive colorectal cancer screening using Cologuard test 05/12/2021   Patient travels 12/27/2020   Arthritis of lumbar spine 10/01/2020   Left lumbar radiculopathy 04/02/2020   Prediabetes 04/03/2019   Thyroid nodule 03/22/2019   Osteoporosis 08/24/2017   DNR (do not resuscitate) discussion 05/23/2015   Diminished hearing 01/22/2015   Low hemoglobin 10/15/2014   Vitamin D deficiency 10/15/2014   Hyperlipidemia 10/15/2014   Retained orthopedic hardware 04/14/2013   Triggering of digit 02/03/2013   Closed fracture of lower end of left radius with malunion 02/03/2013    REFERRING DIAG: M54.16 (ICD-10-CM) - Lumbar radiculopathy  M53.3 (ICD-10-CM) - Pain in sacrum  M51.37 (ICD-10-CM) - DDD (degenerative disc disease), lumbosacral  M16.10 (ICD-10-CM) - Hip arthritis  M54.32 (ICD-10-CM) - Sciatica of left side  THERAPY DIAG:  Pain in left hip  Other low back pain  Radiculopathy, lumbosacral region  Rationale for Evaluation and Treatment Rehabilitation  PERTINENT HISTORY: Low back pain. Feels like L hip higher. Worked on realigning at [employer]. The out of alignment is pinching L LE nerve (L5 dermatome). L LE pain is now localized to L posterior hip. Going to a massage therapist for her hip. Uses a back brace when she is long distance  driving, and performing standing chores which help. Has a tendency to rotate her L hip forward. Normally walks 3-4 miles a day which aggravates her symptoms. Currently walks 10 minutes at a time at home now. Ices her L low back after walking. Does yoga regularly. Feels like she is sitting on her B iscial tuberosities when sitting down. Stops every 2 hours of long distance driving to stretch and take a break. Back does not really hurt, pain is mainly at L posterior hip. Has a stack of HEP at home which include B knee to chest, bridges,  S/L hip abduction. Yoga: downward dog, warrior poses, lower trunk rotation, cobra pose (prone press-ups), prayer stretch, cat/camel, quadruped alternating shoulder flexion with contralatearl hip extension, quaduped hip extensions. L vastus lateralis feels more tight than the R. Could not sleep at night when L hip was bothering her. Pain began 2-3 years ago. Recently started horse back riding again but had to give it back. Also has osteoporosis. Has problems at L4/L5  PRECAUTIONS: Osteoporosis  SUBJECTIVE: Wend on vacation and regressed. Was not able to exercise where she was. Did a lot of sitting (driving to [facility] as well as when hanging out with the kids) L low back feels stiff and sore. Did exercises.    PAIN:  Are you having pain? 4/10 currently     TODAY'S TREATMENT:    Objective measurements completed on examination: See above findings.      Sleeps on L side    Also working with chiropractor 2x/month for alignment and massage therapist every 3-4 weeks.      TREATMENT: 12/30/2021  Static lunge with 5 second holds  L 10x3  R 10x3. Decreased L posterior hip pain afterwards.   Bent over glute extension   R 10x3  L 10x3  Weak glute max observed B    Hook lying LTR's: x20/direction   Improved low back comfort level, decreased tightness reported   Hooklying reverse crunch 10x3 with 5 second holds   Standing B low rows red band 10x5 seconds for 2 sets        Improved exercise technique, movement at target joints, use of target muscles after mod verbal, visual, tactile cues.            Response to treatment Pt tolerated session well without aggravation of symptoms. Improved L low back/posterior hip comfort after session reported.        Clinical impression  Weak glute max muscle use observed during bent over glute max extension exercise. Continued working on improving glute and trunk strength to decrease L low back pressure during standing tasks. Pt tolerated  session well without aggravation of symptoms. Improved L low back/posterior hip comfort after session reported. Pt will benefit from continued skilled physical therapy services to decrease pain, improve strength and function.         PATIENT EDUCATION: Education details: there-ex, HEP Person educated: Patient Education method: Explanation, Demonstration, Tactile cues, Verbal cues, and Handouts Education comprehension: verbalized understanding and returned demonstration   HOME EXERCISE PROGRAM: Access Code: [email or web address] URL: [email or web address] Date: 12/16/2021 Prepared by: Joneen Boers  Exercises - Bilateral Bent Leg Lift  - 1 x daily - 7 x weekly - 2 sets - 10 reps - Standing Single Leg Stance with Unilateral Counter Support  - 2 x daily - 7 x weekly - 1 sets - 10 reps - 5 seconds hold  - Prone Quadriceps Set  - 1 x daily - 7 x weekly - 3  sets - 10 reps      PT Short Term Goals - 11/26/21 1015       PT SHORT TERM GOAL #1   Title Patient will be independent with her initial HEP to decrease pain, improve strength and ability to tolerate prolonged sitting, standing, and walking.    Time 3    Period Weeks    Status New    Target Date 12/18/21              PT Long Term Goals - 11/26/21 1016       PT LONG TERM GOAL #1   Title Patient will have a decrease in L low back/posterior hip pain to 2/10 or less at worst to promote ability to perform standing chores, return to hiking, as well as drive longer distances more comfortably.    Baseline 9/10 L low back/posterior hip pain at worst for the past 3 months (11/26/2021)    Time 8    Period Weeks    Status New    Target Date 01/22/22      PT LONG TERM GOAL #2   Title Patient will improve her hip extension and abduction strength by at least 1/2 MMT grade to promote ability to perform standing tasks more comfortably.    Baseline Hip extension 4-/5 R and L, hip abduction 4/5 R, 4-/5 L (11/26/2021)    Time 8     Period Weeks    Status New    Target Date 01/22/22      PT LONG TERM GOAL #3   Title Patient will improve her FOTO score by at least 10 points as a demonstration of improved function.    Baseline Lumbar Spine FOTO 53 (11/26/2021)    Time 8    Period Weeks    Status New    Target Date 01/22/22      PT LONG TERM GOAL #4   Title Pt will be able to return to hiking about 3-4 miles with 2/10 L low back/posterior hip pain or less to promote fitness.    Baseline Currently walks 10 minutes at a time at home (11/26/2021)    Time 8    Period Weeks    Status New    Target Date 01/22/22              Plan - 12/30/21 1103     Clinical Impression Statement Weak glute max muscle use observed during bent over glute max extension exercise. Continued working on improving glute and trunk strength to decrease L low back pressure during standing tasks. Pt tolerated session well without aggravation of symptoms. Improved L low back/posterior hip comfort after session reported. Pt will benefit from continued skilled physical therapy services to decrease pain, improve strength and function.    Personal Factors and Comorbidities Comorbidity 2;Age;Time since onset of injury/illness/exacerbation    Comorbidities Age, osteoporosis    Examination-Activity Limitations Stand;Sit;Bend;Lift;Locomotion Level    Stability/Clinical Decision Making Stable/Uncomplicated    Rehab Potential Fair    Clinical Impairments Affecting Rehab Potential (+) motivated; (-) age, osteoporosis, chronicity of condition    PT Frequency 2x / week    PT Duration 8 weeks    PT Treatment/Interventions Therapeutic exercise;Therapeutic activities;Neuromuscular re-education;Patient/family education;Manual techniques;Dry needling;Aquatic Therapy;Electrical Stimulation;Iontophoresis '4mg'$ /ml Dexamethasone;Traction    PT Next Visit Plan Posture, trunk and hip strengthening, extension, lumbopelvic stability and control, manual techniques, modalities  PRN    Consulted and Agree with Plan of Care Patient  Joneen Boers PT, DPT  12/30/2021, 11:58 AM

## 2022-01-01 ENCOUNTER — Ambulatory Visit: Payer: Medicare HMO

## 2022-01-01 DIAGNOSIS — M25552 Pain in left hip: Secondary | ICD-10-CM

## 2022-01-01 DIAGNOSIS — M5417 Radiculopathy, lumbosacral region: Secondary | ICD-10-CM

## 2022-01-01 DIAGNOSIS — M5459 Other low back pain: Secondary | ICD-10-CM

## 2022-01-01 NOTE — Therapy (Signed)
OUTPATIENT PHYSICAL THERAPY TREATMENT NOTE   Patient Name: Stacey Munoz MRN: [id number] DOB:28-Aug-1945, 76 y.o., female Today's Date: 01/01/2022  PCP: McLean-Scocuzza, Nino Glow, MD REFERRING PROVIDER: McLean-Scocuzza, Nino Glow, MD   PT End of Session - 01/01/22 1506     Number of Visits 17    Date for PT Re-Evaluation 01/22/22    Authorization Type 6    Authorization Time Period 10 progress report    PT Start Time 1506    PT Stop Time 1547    PT Time Calculation (min) 41 min    Activity Tolerance Patient tolerated treatment well    Behavior During Therapy [facility] for tasks assessed/performed                Past Medical History:  Diagnosis Date   COVID-19    11/08/20   Hyperlipidemia    Low serum vitamin D    Osteoporosis    Past Surgical History:  Procedure Laterality Date   CATARACT EXTRACTION W/PHACO Right 09/16/2021   Procedure: CATARACT EXTRACTION PHACO AND INTRAOCULAR LENS PLACEMENT ([place]) RIGHT 3.80 00:36.0;  Surgeon: Birder Robson, MD;  Location: [place];  Service: Ophthalmology;  Laterality: Right;  needs to have an AM appointment   CATARACT EXTRACTION W/PHACO Left 09/30/2021   Procedure: CATARACT EXTRACTION PHACO AND INTRAOCULAR LENS PLACEMENT ([facility]) LEFT 5.17 00:33.9;  Surgeon: Birder Robson, MD;  Location: [place];  Service: Ophthalmology;  Laterality: Left;   COLONOSCOPY WITH PROPOFOL N/A 05/20/2021   Procedure: COLONOSCOPY WITH PROPOFOL;  Surgeon: Lucilla Lame, MD;  Location: [facility] ENDOSCOPY;  Service: Endoscopy;  Laterality: N/A;   FACIAL COSMETIC SURGERY     FRACTURE SURGERY     KNEE ARTHROSCOPY Right 01/2011   torn meniscus   ROOT CANAL Left    04/2021 surgery schedule   TRIGGER FINGER RELEASE Right 08/2014   TUBAL LIGATION     WRIST FRACTURE SURGERY     left   Patient Active Problem List   Diagnosis Date Noted   DDD (degenerative disc disease), lumbosacral 11/06/2021   Lumbar radiculopathy 11/06/2021   Uterine fibroid  11/06/2021   Hip arthritis 11/06/2021   Personal history of colonic polyps    Polyp of ascending colon    Positive colorectal cancer screening using Cologuard test 05/12/2021   Patient travels 12/27/2020   Arthritis of lumbar spine 10/01/2020   Left lumbar radiculopathy 04/02/2020   Prediabetes 04/03/2019   Thyroid nodule 03/22/2019   Osteoporosis 08/24/2017   DNR (do not resuscitate) discussion 05/23/2015   Diminished hearing 01/22/2015   Low hemoglobin 10/15/2014   Vitamin D deficiency 10/15/2014   Hyperlipidemia 10/15/2014   Retained orthopedic hardware 04/14/2013   Triggering of digit 02/03/2013   Closed fracture of lower end of left radius with malunion 02/03/2013    REFERRING DIAG: M54.16 (ICD-10-CM) - Lumbar radiculopathy  M53.3 (ICD-10-CM) - Pain in sacrum  M51.37 (ICD-10-CM) - DDD (degenerative disc disease), lumbosacral  M16.10 (ICD-10-CM) - Hip arthritis  M54.32 (ICD-10-CM) - Sciatica of left side  THERAPY DIAG:  Pain in left hip  Other low back pain  Radiculopathy, lumbosacral region  Rationale for Evaluation and Treatment Rehabilitation  PERTINENT HISTORY: Low back pain. Feels like L hip higher. Worked on realigning at [employer]. The out of alignment is pinching L LE nerve (L5 dermatome). L LE pain is now localized to L posterior hip. Going to a massage therapist for her hip. 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 Has a tendency to rotate her L hip forward. Normally walks 3-4 miles a day which aggravates her symptoms. Currently walks 10 minutes at a time at home now. Ices her L low back after walking. Does yoga regularly. Feels like she is sitting on her B iscial tuberosities when sitting down. Stops every 2 hours of long distance driving to stretch and take a break. Back does not really hurt, pain is mainly at L posterior hip. Has a stack of HEP at home which include B knee to chest, bridges, S/L hip  abduction. Yoga: downward dog, warrior poses, lower trunk rotation, cobra pose (prone press-ups), prayer stretch, cat/camel, quadruped alternating shoulder flexion with contralatearl hip extension, quaduped hip extensions. L vastus lateralis feels more tight than the R. Could not sleep at night when L hip was bothering her. Pain began 2-3 years ago. Recently started horse back riding again but had to give it back. Also has osteoporosis. Has problems at L4/L5  PRECAUTIONS: Osteoporosis  SUBJECTIVE: Still having problems with her L hip. Walked 1.2 miles before going to massage. Has been walking 1 mile to 1.5 miles since returning from [place].  Sitting is worse than walking.   PAIN:  Are you having pain? 5/10 currently when sitting, 3-4/10 when standing. Walking does not cause much pain.      TODAY'S TREATMENT:    Objective measurements completed on examination: See above findings.      Sleeps on L side    Also working with chiropractor 2x/month for alignment and massage therapist every 3-4 weeks.      TREATMENT: 01/01/2022  Hook lying reverse crunch 10x3 with 5 second holds   Hook lying LTR's: x20/direction   Improved low back comfort level, decreased tightness reported  Crunch 10x3 seconds   Bent over glute extension with PT isometric quadriceps resistance to cancel hamstring use  R 10x3  L 10x3  Weak glute max observed B    Seated hip extension isometrics  R 10x5 seconds   L 10x5 seconds    Sitting with B glute max squeeze. Decreased pressure on B ischial tuberosities.      Improved exercise technique, movement at target joints, use of target muscles after mod verbal, visual, tactile cues.     Response to treatment Pt tolerated session well without aggravation of symptoms. No back pain reported after session.        Clinical impression  Continued working on improving trunk and glute max strength to decrease stress to low back and bilateral ischial  tuberosities. Good muscle use felt with exercises. Pt tolerated session well without aggravation of symptoms. No back pain reported after session. Pt will benefit from continued skilled physical therapy services to decrease pain, improve strength and function.         PATIENT EDUCATION: Education details: there-ex, HEP Person educated: Patient Education method: Explanation, Demonstration, Tactile cues, Verbal cues, and Handouts Education comprehension: verbalized understanding and returned demonstration   HOME EXERCISE PROGRAM: Access Code: [email or web address] URL: [email or web address] Date: 12/16/2021 Prepared by: Joneen Boers  Exercises - Bilateral Bent Leg Lift  - 1 x daily - 7 x weekly - 2 sets - 10 reps - Standing Single Leg Stance with Unilateral Counter Support  - 2 x daily - 7 x weekly - 1 sets - 10 reps - 5 seconds hold  - Prone Quadriceps Set  - 1 x daily - 7 x weekly - 3 sets - 10 reps      PT Short  Term Goals - 11/26/21 1015       PT SHORT TERM GOAL #1   Title Patient will be independent with her initial HEP to decrease pain, improve strength and ability to tolerate prolonged sitting, standing, and walking.    Time 3    Period Weeks    Status New    Target Date 12/18/21              PT Long Term Goals - 11/26/21 1016       PT LONG TERM GOAL #1   Title Patient will have a decrease in L low back/posterior hip pain to 2/10 or less at worst to promote ability to perform standing chores, return to hiking, as well as drive longer distances more comfortably.    Baseline 9/10 L low back/posterior hip pain at worst for the past 3 months (11/26/2021)    Time 8    Period Weeks    Status New    Target Date 01/22/22      PT LONG TERM GOAL #2   Title Patient will improve her hip extension and abduction strength by at least 1/2 MMT grade to promote ability to perform standing tasks more comfortably.    Baseline Hip extension 4-/5 R and L, hip abduction 4/5 R,  4-/5 L (11/26/2021)    Time 8    Period Weeks    Status New    Target Date 01/22/22      PT LONG TERM GOAL #3   Title Patient will improve her FOTO score by at least 10 points as a demonstration of improved function.    Baseline Lumbar Spine FOTO 53 (11/26/2021)    Time 8    Period Weeks    Status New    Target Date 01/22/22      PT LONG TERM GOAL #4   Title Pt will be able to return to hiking about 3-4 miles with 2/10 L low back/posterior hip pain or less to promote fitness.    Baseline Currently walks 10 minutes at a time at home (11/26/2021)    Time 8    Period Weeks    Status New    Target Date 01/22/22              Plan - 01/01/22 1532     Clinical Impression Statement Continued working on improving trunk and glute max strength to decrease stress to low back and bilateral ischial tuberosities. Good muscle use felt with exercises. Pt tolerated session well without aggravation of symptoms. No back pain reported after session. Pt will benefit from continued skilled physical therapy services to decrease pain, improve strength and function.    Personal Factors and Comorbidities Comorbidity 2;Age;Time since onset of injury/illness/exacerbation    Comorbidities Age, osteoporosis    Examination-Activity Limitations Stand;Sit;Bend;Lift;Locomotion Level    Stability/Clinical Decision Making Stable/Uncomplicated    Clinical Decision Making Low    Rehab Potential Fair    Clinical Impairments Affecting Rehab Potential (+) motivated; (-) age, osteoporosis, chronicity of condition    PT Frequency 2x / week    PT Duration 8 weeks    PT Treatment/Interventions Therapeutic exercise;Therapeutic activities;Neuromuscular re-education;Patient/family education;Manual techniques;Dry needling;Aquatic Therapy;Electrical Stimulation;Iontophoresis '4mg'$ /ml Dexamethasone;Traction    PT Next Visit Plan Posture, trunk and hip strengthening, extension, lumbopelvic stability and control, manual techniques,  modalities PRN    Consulted and Agree with Plan of Care Patient                 [facility]  PT, DPT  01/01/2022, 3:52 PM

## 2022-01-06 ENCOUNTER — Ambulatory Visit: Payer: Medicare HMO

## 2022-01-08 ENCOUNTER — Ambulatory Visit: Payer: Medicare HMO

## 2022-01-08 DIAGNOSIS — M5417 Radiculopathy, lumbosacral region: Secondary | ICD-10-CM

## 2022-01-08 DIAGNOSIS — M25552 Pain in left hip: Secondary | ICD-10-CM | POA: Diagnosis not present

## 2022-01-08 DIAGNOSIS — M5459 Other low back pain: Secondary | ICD-10-CM

## 2022-01-08 NOTE — Therapy (Signed)
OUTPATIENT PHYSICAL THERAPY TREATMENT NOTE   Patient Name: Stacey Munoz MRN: [id number] DOB:[birth year], 76 y.o., female Today's Date: 01/08/2022  PCP: McLean-Scocuzza, Nino Glow, MD REFERRING PROVIDER: McLean-Scocuzza, Nino Glow, MD   PT End of Session - 01/08/22 1018     Visit Number 7    Number of Visits 17    Date for PT Re-Evaluation 01/22/22    Authorization Type 7    Authorization Time Period 10 progress report    PT Start Time 1018    PT Stop Time 1103    PT Time Calculation (min) 45 min    Activity Tolerance Patient tolerated treatment well    Behavior During Therapy [facility] for tasks assessed/performed                 Past Medical History:  Diagnosis Date   COVID-19    11/08/20   Hyperlipidemia    Low serum vitamin D    Osteoporosis    Past Surgical History:  Procedure Laterality Date   CATARACT EXTRACTION W/PHACO Right 09/16/2021   Procedure: CATARACT EXTRACTION PHACO AND INTRAOCULAR LENS PLACEMENT (IOC) RIGHT 3.80 00:36.0;  Surgeon: Birder Robson, MD;  Location: [place];  Service: Ophthalmology;  Laterality: Right;  needs to have an AM appointment   CATARACT EXTRACTION W/PHACO Left 09/30/2021   Procedure: CATARACT EXTRACTION PHACO AND INTRAOCULAR LENS PLACEMENT (IOC) LEFT 5.17 00:33.9;  Surgeon: Birder Robson, MD;  Location: [place];  Service: Ophthalmology;  Laterality: Left;   COLONOSCOPY WITH PROPOFOL N/A 05/20/2021   Procedure: COLONOSCOPY WITH PROPOFOL;  Surgeon: Lucilla Lame, MD;  Location: [facility] ENDOSCOPY;  Service: Endoscopy;  Laterality: N/A;   FACIAL COSMETIC SURGERY     FRACTURE SURGERY     KNEE ARTHROSCOPY Right 01/2011   torn meniscus   ROOT CANAL Left    04/2021 surgery schedule   TRIGGER FINGER RELEASE Right 08/2014   TUBAL LIGATION     WRIST FRACTURE SURGERY     left   Patient Active Problem List   Diagnosis Date Noted   DDD (degenerative disc disease), lumbosacral 11/06/2021   Lumbar radiculopathy 11/06/2021    Uterine fibroid 11/06/2021   Hip arthritis 11/06/2021   Personal history of colonic polyps    Polyp of ascending colon    Positive colorectal cancer screening using Cologuard test 05/12/2021   Patient travels 12/27/2020   Arthritis of lumbar spine 10/01/2020   Left lumbar radiculopathy 04/02/2020   Prediabetes 04/03/2019   Thyroid nodule 03/22/2019   Osteoporosis 08/24/2017   DNR (do not resuscitate) discussion 05/23/2015   Diminished hearing 01/22/2015   Low hemoglobin 10/15/2014   Vitamin D deficiency 10/15/2014   Hyperlipidemia 10/15/2014   Retained orthopedic hardware 04/14/2013   Triggering of digit 02/03/2013   Closed fracture of lower end of left radius with malunion 02/03/2013    REFERRING DIAG: M54.16 (ICD-10-CM) - Lumbar radiculopathy  M53.3 (ICD-10-CM) - Pain in sacrum  M51.37 (ICD-10-CM) - DDD (degenerative disc disease), lumbosacral  M16.10 (ICD-10-CM) - Hip arthritis  M54.32 (ICD-10-CM) - Sciatica of left side  THERAPY DIAG:  Pain in left hip  Other low back pain  Radiculopathy, lumbosacral region  Rationale for Evaluation and Treatment Rehabilitation  PERTINENT HISTORY: Low back pain. Feels like L hip higher. Worked on realigning at [employer]. The out of alignment is pinching L LE nerve (L5 dermatome). L LE pain is now localized to L posterior hip. Going to a massage therapist for her hip. Uses a back brace when she is  long distance driving, and performing standing chores which help. Has a tendency to rotate her L hip forward. Normally walks 3-4 miles a day which aggravates her symptoms. Currently walks 10 minutes at a time at home now. Ices her L low back after walking. Does yoga regularly. Feels like she is sitting on her B iscial tuberosities when sitting down. Stops every 2 hours of long distance driving to stretch and take a break. Back does not really hurt, pain is mainly at L posterior hip. Has a stack of HEP at home which include B knee to chest,  bridges, S/L hip abduction. Yoga: downward dog, warrior poses, lower trunk rotation, cobra pose (prone press-ups), prayer stretch, cat/camel, quadruped alternating shoulder flexion with contralatearl hip extension, quaduped hip extensions. L vastus lateralis feels more tight than the R. Could not sleep at night when L hip was bothering her. Pain began 2-3 years ago. Recently started horse back riding again but had to give it back. Also has osteoporosis. Has problems at L4/L5  PRECAUTIONS: Osteoporosis  SUBJECTIVE: Has not been able to do some of her HEP due to having a guest. Back feels good. Muscles feel used after exercises.   PAIN:  Are you having pain? 0/10 back pain currently.     TODAY'S TREATMENT:    Objective measurements completed on examination: See above findings.      Sleeps on L side    Also working with chiropractor 2x/month for alignment and massage therapist every 3-4 weeks.      TREATMENT: 01/08/2022  Crunch 10x5 seconds for 3 sets  Hook lying LTR's: x20/direction     Hook lying reverse crunch 10x3  Quadruped glute max extension   R 10x  L 10x  Prone glute max extension, 2 pillows under abdomen  R 10x3  L 10x3   Bent over glute extension with PT isometric quadriceps resistance to cancel hamstring use  R 10x3  L 10x3  Seated hip adduction folded pillow squeeze 10x5 seconds for 3 sets.   Decreased L posterior hip tightness reported.        Improved exercise technique, movement at target joints, use of target muscles after mod verbal, visual, tactile cues.     Response to treatment Pt tolerated session well without aggravation of symptoms. No back pain reported after session.        Clinical impression Continued working on improving trunk and glute max strength to decrease stress to low back. Good muscle use felt with exercises. Decreased L posterior hip tightness reported after seated hip adduction isometric exercise suggesting possible L  piriformis involvement in her symptoms.  Pt tolerated session well without aggravation of symptoms. No back pain reported after session. Pt will benefit from continued skilled physical therapy services to decrease pain, improve strength and function.         PATIENT EDUCATION: Education details: there-ex, HEP Person educated: Patient Education method: Explanation, Demonstration, Tactile cues, Verbal cues, and Handouts Education comprehension: verbalized understanding and returned demonstration   HOME EXERCISE PROGRAM: Access Code: [email or web address] URL: [email or web address] Date: 12/16/2021 Prepared by: Joneen Boers  Exercises - Bilateral Bent Leg Lift  - 1 x daily - 7 x weekly - 2 sets - 10 reps - Standing Single Leg Stance with Unilateral Counter Support  - 2 x daily - 7 x weekly - 1 sets - 10 reps - 5 seconds hold  - Prone Quadriceps Set  - 1 x daily - 7 x weekly - 3 sets - 10 reps  -  Prone Hip Extension with Bent Knee - Two Pillows  - 1 x daily - 7 x weekly - 3 sets - 10 reps    PT Short Term Goals - 11/26/21 1015       PT SHORT TERM GOAL #1   Title Patient will be independent with her initial HEP to decrease pain, improve strength and ability to tolerate prolonged sitting, standing, and walking.    Time 3    Period Weeks    Status New    Target Date 12/18/21              PT Long Term Goals - 11/26/21 1016       PT LONG TERM GOAL #1   Title Patient will have a decrease in L low back/posterior hip pain to 2/10 or less at worst to promote ability to perform standing chores, return to hiking, as well as drive longer distances more comfortably.    Baseline 9/10 L low back/posterior hip pain at worst for the past 3 months (11/26/2021)    Time 8    Period Weeks    Status New    Target Date 01/22/22      PT LONG TERM GOAL #2   Title Patient will improve her hip extension and abduction strength by at least 1/2 MMT grade to promote ability to perform standing  tasks more comfortably.    Baseline Hip extension 4-/5 R and L, hip abduction 4/5 R, 4-/5 L (11/26/2021)    Time 8    Period Weeks    Status New    Target Date 01/22/22      PT LONG TERM GOAL #3   Title Patient will improve her FOTO score by at least 10 points as a demonstration of improved function.    Baseline Lumbar Spine FOTO 53 (11/26/2021)    Time 8    Period Weeks    Status New    Target Date 01/22/22      PT LONG TERM GOAL #4   Title Pt will be able to return to hiking about 3-4 miles with 2/10 L low back/posterior hip pain or less to promote fitness.    Baseline Currently walks 10 minutes at a time at home (11/26/2021)    Time 8    Period Weeks    Status New    Target Date 01/22/22              Plan - 01/08/22 1018     Clinical Impression Statement Continued working on improving trunk and glute max strength to decrease stress to low back. Good muscle use felt with exercises. Decreased L posterior hip tightness reported after seated hip adduction isometric exercise suggesting possible L piriformis involvement in her symptoms.  Pt tolerated session well without aggravation of symptoms. No back pain reported after session. Pt will benefit from continued skilled physical therapy services to decrease pain, improve strength and function.    Personal Factors and Comorbidities Comorbidity 2;Age;Time since onset of injury/illness/exacerbation    Comorbidities Age, osteoporosis    Examination-Activity Limitations Stand;Sit;Bend;Lift;Locomotion Level    Stability/Clinical Decision Making Stable/Uncomplicated    Clinical Decision Making Low    Rehab Potential Fair    Clinical Impairments Affecting Rehab Potential (+) motivated; (-) age, osteoporosis, chronicity of condition    PT Frequency 2x / week    PT Duration 8 weeks    PT Treatment/Interventions Therapeutic exercise;Therapeutic activities;Neuromuscular re-education;Patient/family education;Manual techniques;Dry needling;Aquatic  Therapy;Electrical Stimulation;Iontophoresis '4mg'$ /ml Dexamethasone;Traction    PT  Next Visit Plan Posture, trunk and hip strengthening, extension, lumbopelvic stability and control, manual techniques, modalities PRN    Consulted and Agree with Plan of Care Patient                  Joneen Boers PT, DPT  01/08/2022, 11:10 AM

## 2022-01-12 ENCOUNTER — Ambulatory Visit: Payer: Medicare HMO

## 2022-01-12 DIAGNOSIS — M5459 Other low back pain: Secondary | ICD-10-CM | POA: Diagnosis not present

## 2022-01-12 DIAGNOSIS — M5417 Radiculopathy, lumbosacral region: Secondary | ICD-10-CM

## 2022-01-12 DIAGNOSIS — M25552 Pain in left hip: Secondary | ICD-10-CM | POA: Diagnosis not present

## 2022-01-12 NOTE — Therapy (Signed)
OUTPATIENT PHYSICAL THERAPY TREATMENT NOTE   Patient Name: Stacey Munoz MRN: [id number] DOB:05-Oct-1945, 76 y.o., female Today's Date: 01/12/2022  PCP: McLean-Scocuzza, Nino Glow, MD REFERRING PROVIDER: McLean-Scocuzza, Nino Glow, MD   PT End of Session - 01/12/22 1105     Visit Number 8    Number of Visits 17    Date for PT Re-Evaluation 01/22/22    Authorization Type 8    Authorization Time Period 10 progress report    PT Start Time 1105    PT Stop Time 1145    PT Time Calculation (min) 40 min    Activity Tolerance Patient tolerated treatment well    Behavior During Therapy [facility] for tasks assessed/performed                  Past Medical History:  Diagnosis Date   COVID-19    11/08/20   Hyperlipidemia    Low serum vitamin D    Osteoporosis    Past Surgical History:  Procedure Laterality Date   CATARACT EXTRACTION W/PHACO Right 09/16/2021   Procedure: CATARACT EXTRACTION PHACO AND INTRAOCULAR LENS PLACEMENT ([place]) RIGHT 3.80 00:36.0;  Surgeon: Birder Robson, MD;  Location: [place];  Service: Ophthalmology;  Laterality: Right;  needs to have an AM appointment   CATARACT EXTRACTION W/PHACO Left 09/30/2021   Procedure: CATARACT EXTRACTION PHACO AND INTRAOCULAR LENS PLACEMENT (IOC) LEFT 5.17 00:33.9;  Surgeon: Birder Robson, MD;  Location: [place];  Service: Ophthalmology;  Laterality: Left;   COLONOSCOPY WITH PROPOFOL N/A 05/20/2021   Procedure: COLONOSCOPY WITH PROPOFOL;  Surgeon: Lucilla Lame, MD;  Location: [facility] ENDOSCOPY;  Service: Endoscopy;  Laterality: N/A;   FACIAL COSMETIC SURGERY     FRACTURE SURGERY     KNEE ARTHROSCOPY Right 01/2011   torn meniscus   ROOT CANAL Left    04/2021 surgery schedule   TRIGGER FINGER RELEASE Right 08/2014   TUBAL LIGATION     WRIST FRACTURE SURGERY     left   Patient Active Problem List   Diagnosis Date Noted   DDD (degenerative disc disease), lumbosacral 11/06/2021   Lumbar radiculopathy  11/06/2021   Uterine fibroid 11/06/2021   Hip arthritis 11/06/2021   Personal history of colonic polyps    Polyp of ascending colon    Positive colorectal cancer screening using Cologuard test 05/12/2021   Patient travels 12/27/2020   Arthritis of lumbar spine 10/01/2020   Left lumbar radiculopathy 04/02/2020   Prediabetes 04/03/2019   Thyroid nodule 03/22/2019   Osteoporosis 08/24/2017   DNR (do not resuscitate) discussion 05/23/2015   Diminished hearing 01/22/2015   Low hemoglobin 10/15/2014   Vitamin D deficiency 10/15/2014   Hyperlipidemia 10/15/2014   Retained orthopedic hardware 04/14/2013   Triggering of digit 02/03/2013   Closed fracture of lower end of left radius with malunion 02/03/2013    REFERRING DIAG: M54.16 (ICD-10-CM) - Lumbar radiculopathy  M53.3 (ICD-10-CM) - Pain in sacrum  M51.37 (ICD-10-CM) - DDD (degenerative disc disease), lumbosacral  M16.10 (ICD-10-CM) - Hip arthritis  M54.32 (ICD-10-CM) - Sciatica of left side  THERAPY DIAG:  Pain in left hip  Other low back pain  Radiculopathy, lumbosacral region  Rationale for Evaluation and Treatment Rehabilitation  PERTINENT HISTORY: Low back pain. Feels like L hip higher. Worked on realigning at [employer]. The out of alignment is pinching L LE nerve (L5 dermatome). L LE pain is now localized to L posterior hip. Going to a massage therapist for her hip. Uses a back brace when she  is long distance driving, and performing standing chores which help. Has a tendency to rotate her L hip forward. Normally walks 3-4 miles a day which aggravates her symptoms. Currently walks 10 minutes at a time at home now. Ices her L low back after walking. Does yoga regularly. Feels like she is sitting on her B iscial tuberosities when sitting down. Stops every 2 hours of long distance driving to stretch and take a break. Back does not really hurt, pain is mainly at L posterior hip. Has a stack of HEP at home which include B knee to  chest, bridges, S/L hip abduction. Yoga: downward dog, warrior poses, lower trunk rotation, cobra pose (prone press-ups), prayer stretch, cat/camel, quadruped alternating shoulder flexion with contralatearl hip extension, quaduped hip extensions. L vastus lateralis feels more tight than the R. Could not sleep at night when L hip was bothering her. Pain began 2-3 years ago. Recently started horse back riding again but had to give it back. Also has osteoporosis. Has problems at L4/L5  PRECAUTIONS: Osteoporosis  SUBJECTIVE: Back is pretty much the same. Was not able to do much home exercises due to having a guest over. Was able to walk 2 miles each day Sat and Sun.   PAIN:  Are you having pain? 0/10 back pain currently.     TODAY'S TREATMENT:    Objective measurements completed on examination: See above findings.      Sleeps on L side    Also working with chiropractor 2x/month for alignment and massage therapist every 3-4 weeks.      TREATMENT: 01/12/2022  Bent over glute extension with PT isometric quadriceps resistance to cancel hamstring use  R 10x3 with 5 second holds  L 10x3 with 5 second holds  Seated hip adduction folded pillow squeeze 10x10 seconds for 2 sets.   Decreased L posterior hip tightness reported.   Hook lying hip extension isometrics, leg straight  L 10x3 with 5 second holds    R 10x3 with 5 second holds   Hook lying reverse crunch 10x3  Crunch 10x5 seconds for 3 sets  B SCM muscle discomfort.   Prone glute max extension, 2 pillows under abdomen  R 10x  L 10x        Improved exercise technique, movement at target joints, use of target muscles after mod verbal, visual, tactile cues.     Response to treatment Pt tolerated session well without aggravation of symptoms.         Clinical impression   Continued working on improving trunk and glute max strength to decrease stress to low back. Good muscle use felt with exercises. Pt tolerated  session well without aggravation of symptoms. Pt will benefit from continued skilled physical therapy services to decrease pain, improve strength and function.         PATIENT EDUCATION: Education details: there-ex, HEP Person educated: Patient Education method: Explanation, Demonstration, Tactile cues, Verbal cues, and Handouts Education comprehension: verbalized understanding and returned demonstration   HOME EXERCISE PROGRAM: Access Code: [email or web address] URL: [email or web address] Date: 12/16/2021 Prepared by: Joneen Boers  Exercises - Bilateral Bent Leg Lift  - 1 x daily - 7 x weekly - 2 sets - 10 reps - Standing Single Leg Stance with Unilateral Counter Support  - 2 x daily - 7 x weekly - 1 sets - 10 reps - 5 seconds hold  - Prone Quadriceps Set  - 1 x daily - 7 x weekly - 3 sets - 10 reps  -  Prone Hip Extension with Bent Knee - Two Pillows  - 1 x daily - 7 x weekly - 3 sets - 10 reps    PT Short Term Goals - 11/26/21 1015       PT SHORT TERM GOAL #1   Title Patient will be independent with her initial HEP to decrease pain, improve strength and ability to tolerate prolonged sitting, standing, and walking.    Time 3    Period Weeks    Status New    Target Date 12/18/21              PT Long Term Goals - 11/26/21 1016       PT LONG TERM GOAL #1   Title Patient will have a decrease in L low back/posterior hip pain to 2/10 or less at worst to promote ability to perform standing chores, return to hiking, as well as drive longer distances more comfortably.    Baseline 9/10 L low back/posterior hip pain at worst for the past 3 months (11/26/2021)    Time 8    Period Weeks    Status New    Target Date 01/22/22      PT LONG TERM GOAL #2   Title Patient will improve her hip extension and abduction strength by at least 1/2 MMT grade to promote ability to perform standing tasks more comfortably.    Baseline Hip extension 4-/5 R and L, hip abduction 4/5 R, 4-/5  L (11/26/2021)    Time 8    Period Weeks    Status New    Target Date 01/22/22      PT LONG TERM GOAL #3   Title Patient will improve her FOTO score by at least 10 points as a demonstration of improved function.    Baseline Lumbar Spine FOTO 53 (11/26/2021)    Time 8    Period Weeks    Status New    Target Date 01/22/22      PT LONG TERM GOAL #4   Title Pt will be able to return to hiking about 3-4 miles with 2/10 L low back/posterior hip pain or less to promote fitness.    Baseline Currently walks 10 minutes at a time at home (11/26/2021)    Time 8    Period Weeks    Status New    Target Date 01/22/22              Plan - 01/12/22 1104     Clinical Impression Statement Continued working on improving trunk and glute max strength to decrease stress to low back. Good muscle use felt with exercises. Pt tolerated session well without aggravation of symptoms. Pt will benefit from continued skilled physical therapy services to decrease pain, improve strength and function.    Personal Factors and Comorbidities Comorbidity 2;Age;Time since onset of injury/illness/exacerbation    Comorbidities Age, osteoporosis    Examination-Activity Limitations Stand;Sit;Bend;Lift;Locomotion Level    Stability/Clinical Decision Making Stable/Uncomplicated    Rehab Potential Fair    Clinical Impairments Affecting Rehab Potential (+) motivated; (-) age, osteoporosis, chronicity of condition    PT Frequency 2x / week    PT Duration 8 weeks    PT Treatment/Interventions Therapeutic exercise;Therapeutic activities;Neuromuscular re-education;Patient/family education;Manual techniques;Dry needling;Aquatic Therapy;Electrical Stimulation;Iontophoresis '4mg'$ /ml Dexamethasone;Traction    PT Next Visit Plan Posture, trunk and hip strengthening, extension, lumbopelvic stability and control, manual techniques, modalities PRN    Consulted and Agree with Plan of Care Patient  Joneen Boers PT,  DPT  01/12/2022, 11:55 AM

## 2022-01-14 ENCOUNTER — Ambulatory Visit: Payer: Medicare HMO

## 2022-01-14 DIAGNOSIS — M5459 Other low back pain: Secondary | ICD-10-CM | POA: Diagnosis not present

## 2022-01-14 DIAGNOSIS — M5417 Radiculopathy, lumbosacral region: Secondary | ICD-10-CM

## 2022-01-14 DIAGNOSIS — M25552 Pain in left hip: Secondary | ICD-10-CM

## 2022-01-14 NOTE — Therapy (Signed)
OUTPATIENT PHYSICAL THERAPY TREATMENT NOTE   Patient Name: Stacey Munoz MRN: [id number] DOB:[birth year], 76 y.o., female Today's Date: 01/14/2022  PCP: McLean-Scocuzza, Nino Glow, MD REFERRING PROVIDER: McLean-Scocuzza, Nino Glow, MD   PT End of Session - 01/14/22 1415     Visit Number 9    Number of Visits 17    Date for PT Re-Evaluation 01/22/22    Authorization Type 9    Authorization Time Period 10 progress report    PT Start Time 1416    PT Stop Time 1458    PT Time Calculation (min) 42 min    Activity Tolerance Patient tolerated treatment well    Behavior During Therapy [facility] for tasks assessed/performed                   Past Medical History:  Diagnosis Date   COVID-19    11/08/20   Hyperlipidemia    Low serum vitamin D    Osteoporosis    Past Surgical History:  Procedure Laterality Date   CATARACT EXTRACTION W/PHACO Right 09/16/2021   Procedure: CATARACT EXTRACTION PHACO AND INTRAOCULAR LENS PLACEMENT (IOC) RIGHT 3.80 00:36.0;  Surgeon: Birder Robson, MD;  Location: [place];  Service: Ophthalmology;  Laterality: Right;  needs to have an AM appointment   CATARACT EXTRACTION W/PHACO Left 09/30/2021   Procedure: CATARACT EXTRACTION PHACO AND INTRAOCULAR LENS PLACEMENT (IOC) LEFT 5.17 00:33.9;  Surgeon: Birder Robson, MD;  Location: [place];  Service: Ophthalmology;  Laterality: Left;   COLONOSCOPY WITH PROPOFOL N/A 05/20/2021   Procedure: COLONOSCOPY WITH PROPOFOL;  Surgeon: Lucilla Lame, MD;  Location: [facility] ENDOSCOPY;  Service: Endoscopy;  Laterality: N/A;   FACIAL COSMETIC SURGERY     FRACTURE SURGERY     KNEE ARTHROSCOPY Right 01/2011   torn meniscus   ROOT CANAL Left    04/2021 surgery schedule   TRIGGER FINGER RELEASE Right 08/2014   TUBAL LIGATION     WRIST FRACTURE SURGERY     left   Patient Active Problem List   Diagnosis Date Noted   DDD (degenerative disc disease), lumbosacral 11/06/2021   Lumbar radiculopathy  11/06/2021   Uterine fibroid 11/06/2021   Hip arthritis 11/06/2021   Personal history of colonic polyps    Polyp of ascending colon    Positive colorectal cancer screening using Cologuard test 05/12/2021   Patient travels 12/27/2020   Arthritis of lumbar spine 10/01/2020   Left lumbar radiculopathy 04/02/2020   Prediabetes 04/03/2019   Thyroid nodule 03/22/2019   Osteoporosis 08/24/2017   DNR (do not resuscitate) discussion 05/23/2015   Diminished hearing 01/22/2015   Low hemoglobin 10/15/2014   Vitamin D deficiency 10/15/2014   Hyperlipidemia 10/15/2014   Retained orthopedic hardware 04/14/2013   Triggering of digit 02/03/2013   Closed fracture of lower end of left radius with malunion 02/03/2013    REFERRING DIAG: M54.16 (ICD-10-CM) - Lumbar radiculopathy  M53.3 (ICD-10-CM) - Pain in sacrum  M51.37 (ICD-10-CM) - DDD (degenerative disc disease), lumbosacral  M16.10 (ICD-10-CM) - Hip arthritis  M54.32 (ICD-10-CM) - Sciatica of left side  THERAPY DIAG:  Pain in left hip  Other low back pain  Radiculopathy, lumbosacral region  Rationale for Evaluation and Treatment Rehabilitation  PERTINENT HISTORY: Low back pain. Feels like L hip higher. Worked on realigning at [employer]. The out of alignment is pinching L LE nerve (L5 dermatome). L LE pain is now localized to L posterior hip. Going to a massage therapist for her hip. Uses a back brace when  she is long distance driving, and performing standing chores which help. Has a tendency to rotate her L hip forward. Normally walks 3-4 miles a day which aggravates her symptoms. Currently walks 10 minutes at a time at home now. Ices her L low back after walking. Does yoga regularly. Feels like she is sitting on her B iscial tuberosities when sitting down. Stops every 2 hours of long distance driving to stretch and take a break. Back does not really hurt, pain is mainly at L posterior hip. Has a stack of HEP at home which include B knee to  chest, bridges, S/L hip abduction. Yoga: downward dog, warrior poses, lower trunk rotation, cobra pose (prone press-ups), prayer stretch, cat/camel, quadruped alternating shoulder flexion with contralatearl hip extension, quaduped hip extensions. L vastus lateralis feels more tight than the R. Could not sleep at night when L hip was bothering her. Pain began 2-3 years ago. Recently started horse back riding again but had to give it back. Also has osteoporosis. Has problems at L4/L5  PRECAUTIONS: Osteoporosis  SUBJECTIVE: Able to walk about 1.7 miles. Has been exercising. No pain currently. 3/10 L low back and posterior hip pain at worst for the past 7 days. Able to walk up to 2 miles and back is fine  PAIN:  Are you having pain? 0/10 back pain currently.     TODAY'S TREATMENT:    Objective measurements completed on examination: See above findings.      Sleeps on L side    Also working with chiropractor 2x/month for alignment and massage therapist every 3-4 weeks.      TREATMENT: 01/14/2022  Supine L piriformis stretch 30 seconds for 3 sets  Hook lying reverse crunch 10x3 with 5 second holds  Hook lying hip extension isometrics, leg straight  L 10x3 with 5 second holds    R 10x3 with 5 second holds   Crunch 10x3  Prone glute max extension, 2 pillows under abdomen  R 10x5 seconds  L 10x5 seconds  Side stepping yellow band around ankles 32 ft to the R and 32 ft to the L   Good glute med muscle use felt reported.     Lumbar Spine FOTO 83   Improved exercise technique, movement at target joints, use of target muscles after mod verbal, visual, tactile cues.     Response to treatment Pt tolerated session well without aggravation of symptoms.         Clinical impression  Pt demonstrates significant decrease in low back pain and improved ability to ambulate longer distances more comfortably based on subjective reports. Continued working on improving trunk and glute  med and max strength to decrease stress to low back. Good muscle use felt with exercises. Pt tolerated session well without aggravation of symptoms. Pt will benefit from continued skilled physical therapy services to decrease pain, improve strength and function.         PATIENT EDUCATION: Education details: there-ex, HEP Person educated: Patient Education method: Explanation, Demonstration, Tactile cues, Verbal cues, and Handouts Education comprehension: verbalized understanding and returned demonstration   HOME EXERCISE PROGRAM: Access Code: [email or web address] URL: [email or web address] Date: 12/16/2021 Prepared by: Joneen Boers  Exercises - Bilateral Bent Leg Lift  - 1 x daily - 7 x weekly - 2 sets - 10 reps - Standing Single Leg Stance with Unilateral Counter Support  - 2 x daily - 7 x weekly - 1 sets - 10 reps - 5 seconds hold  - Prone Quadriceps Set  -  1 x daily - 7 x weekly - 3 sets - 10 reps  - Prone Hip Extension with Bent Knee - Two Pillows  - 1 x daily - 7 x weekly - 3 sets - 10 reps  - Supine Piriformis Stretch with Foot on Ground  - 1 x daily - 7 x weekly - 1 sets - 3 reps - 30 seconds hold      PT Short Term Goals - 11/26/21 1015       PT SHORT TERM GOAL #1   Title Patient will be independent with her initial HEP to decrease pain, improve strength and ability to tolerate prolonged sitting, standing, and walking.    Time 3    Period Weeks    Status New    Target Date 12/18/21              PT Long Term Goals - 11/26/21 1016       PT LONG TERM GOAL #1   Title Patient will have a decrease in L low back/posterior hip pain to 2/10 or less at worst to promote ability to perform standing chores, return to hiking, as well as drive longer distances more comfortably.    Baseline 9/10 L low back/posterior hip pain at worst for the past 3 months (11/26/2021)    Time 8    Period Weeks    Status New    Target Date 01/22/22      PT LONG TERM GOAL #2    Title Patient will improve her hip extension and abduction strength by at least 1/2 MMT grade to promote ability to perform standing tasks more comfortably.    Baseline Hip extension 4-/5 R and L, hip abduction 4/5 R, 4-/5 L (11/26/2021)    Time 8    Period Weeks    Status New    Target Date 01/22/22      PT LONG TERM GOAL #3   Title Patient will improve her FOTO score by at least 10 points as a demonstration of improved function.    Baseline Lumbar Spine FOTO 53 (11/26/2021)    Time 8    Period Weeks    Status New    Target Date 01/22/22      PT LONG TERM GOAL #4   Title Pt will be able to return to hiking about 3-4 miles with 2/10 L low back/posterior hip pain or less to promote fitness.    Baseline Currently walks 10 minutes at a time at home (11/26/2021)    Time 8    Period Weeks    Status New    Target Date 01/22/22              Plan - 01/14/22 1412     Clinical Impression Statement Pt demonstrates significant decrease in low back pain and improved ability to ambulate longer distances more comfortably based on subjective reports. Continued working on improving trunk and glute med and max strength to decrease stress to low back. Good muscle use felt with exercises. Pt tolerated session well without aggravation of symptoms. Pt will benefit from continued skilled physical therapy services to decrease pain, improve strength and function.    Personal Factors and Comorbidities Comorbidity 2;Age;Time since onset of injury/illness/exacerbation    Comorbidities Age, osteoporosis    Examination-Activity Limitations Stand;Sit;Bend;Lift;Locomotion Level    Stability/Clinical Decision Making Stable/Uncomplicated    Clinical Decision Making Low    Rehab Potential Fair    Clinical Impairments Affecting Rehab Potential (+) motivated; (-)  age, osteoporosis, chronicity of condition    PT Frequency 2x / week    PT Duration 8 weeks    PT Treatment/Interventions Therapeutic exercise;Therapeutic  activities;Neuromuscular re-education;Patient/family education;Manual techniques;Dry needling;Aquatic Therapy;Electrical Stimulation;Iontophoresis '4mg'$ /ml Dexamethasone;Traction    PT Next Visit Plan Posture, trunk and hip strengthening, extension, lumbopelvic stability and control, manual techniques, modalities PRN    Consulted and Agree with Plan of Care Patient                    Joneen Boers PT, DPT  01/14/2022, 3:12 PM

## 2022-01-15 DIAGNOSIS — M5136 Other intervertebral disc degeneration, lumbar region: Secondary | ICD-10-CM | POA: Diagnosis not present

## 2022-01-15 DIAGNOSIS — M9903 Segmental and somatic dysfunction of lumbar region: Secondary | ICD-10-CM | POA: Diagnosis not present

## 2022-01-15 DIAGNOSIS — M5416 Radiculopathy, lumbar region: Secondary | ICD-10-CM | POA: Diagnosis not present

## 2022-01-15 DIAGNOSIS — M62830 Muscle spasm of back: Secondary | ICD-10-CM | POA: Diagnosis not present

## 2022-01-19 ENCOUNTER — Ambulatory Visit: Payer: Medicare HMO

## 2022-01-19 DIAGNOSIS — M5459 Other low back pain: Secondary | ICD-10-CM

## 2022-01-19 DIAGNOSIS — M5417 Radiculopathy, lumbosacral region: Secondary | ICD-10-CM

## 2022-01-19 DIAGNOSIS — M25552 Pain in left hip: Secondary | ICD-10-CM

## 2022-01-19 NOTE — Therapy (Signed)
OUTPATIENT PHYSICAL THERAPY TREATMENT NOTE/PROGRESS NOTE   Patient Name: Stacey Munoz MRN: [id number] DOB:11-Feb-1946, 76 y.o., female Today's Date: 01/19/2022  PCP: McLean-Scocuzza, Nino Glow, MD REFERRING PROVIDER: McLean-Scocuzza, Nino Glow, MD   PT End of Session - 01/19/22 1329     Visit Number 10    Number of Visits 17    Date for PT Re-Evaluation 01/22/22    Authorization Type 10    Authorization Time Period 10 progress report    PT Start Time 1330    PT Stop Time 1415    PT Time Calculation (min) 45 min    Activity Tolerance Patient tolerated treatment well    Behavior During Therapy [facility] for tasks assessed/performed                   Past Medical History:  Diagnosis Date   COVID-19    11/08/20   Hyperlipidemia    Low serum vitamin D    Osteoporosis    Past Surgical History:  Procedure Laterality Date   CATARACT EXTRACTION W/PHACO Right 09/16/2021   Procedure: CATARACT EXTRACTION PHACO AND INTRAOCULAR LENS PLACEMENT (IOC) RIGHT 3.80 00:36.0;  Surgeon: Birder Robson, MD;  Location: [place];  Service: Ophthalmology;  Laterality: Right;  needs to have an AM appointment   CATARACT EXTRACTION W/PHACO Left 09/30/2021   Procedure: CATARACT EXTRACTION PHACO AND INTRAOCULAR LENS PLACEMENT (IOC) LEFT 5.17 00:33.9;  Surgeon: Birder Robson, MD;  Location: [place];  Service: Ophthalmology;  Laterality: Left;   COLONOSCOPY WITH PROPOFOL N/A 05/20/2021   Procedure: COLONOSCOPY WITH PROPOFOL;  Surgeon: Lucilla Lame, MD;  Location: [facility] ENDOSCOPY;  Service: Endoscopy;  Laterality: N/A;   FACIAL COSMETIC SURGERY     FRACTURE SURGERY     KNEE ARTHROSCOPY Right 01/2011   torn meniscus   ROOT CANAL Left    04/2021 surgery schedule   TRIGGER FINGER RELEASE Right 08/2014   TUBAL LIGATION     WRIST FRACTURE SURGERY     left   Patient Active Problem List   Diagnosis Date Noted   DDD (degenerative disc disease), lumbosacral 11/06/2021   Lumbar  radiculopathy 11/06/2021   Uterine fibroid 11/06/2021   Hip arthritis 11/06/2021   Personal history of colonic polyps    Polyp of ascending colon    Positive colorectal cancer screening using Cologuard test 05/12/2021   Patient travels 12/27/2020   Arthritis of lumbar spine 10/01/2020   Left lumbar radiculopathy 04/02/2020   Prediabetes 04/03/2019   Thyroid nodule 03/22/2019   Osteoporosis 08/24/2017   DNR (do not resuscitate) discussion 05/23/2015   Diminished hearing 01/22/2015   Low hemoglobin 10/15/2014   Vitamin D deficiency 10/15/2014   Hyperlipidemia 10/15/2014   Retained orthopedic hardware 04/14/2013   Triggering of digit 02/03/2013   Closed fracture of lower end of left radius with malunion 02/03/2013    REFERRING DIAG: M54.16 (ICD-10-CM) - Lumbar radiculopathy  M53.3 (ICD-10-CM) - Pain in sacrum  M51.37 (ICD-10-CM) - DDD (degenerative disc disease), lumbosacral  M16.10 (ICD-10-CM) - Hip arthritis  M54.32 (ICD-10-CM) - Sciatica of left side  THERAPY DIAG:  Pain in left hip  Other low back pain  Radiculopathy, lumbosacral region  Rationale for Evaluation and Treatment Rehabilitation  PERTINENT HISTORY: Low back pain. Feels like L hip higher. Worked on realigning at [employer]. The out of alignment is pinching L LE nerve (L5 dermatome). L LE pain is now localized to L posterior hip. Going to a massage therapist for her hip. Uses a back brace  when she is long distance driving, and performing standing chores which help. Has a tendency to rotate her L hip forward. Normally walks 3-4 miles a day which aggravates her symptoms. Currently walks 10 minutes at a time at home now. Ices her L low back after walking. Does yoga regularly. Feels like she is sitting on her B iscial tuberosities when sitting down. Stops every 2 hours of long distance driving to stretch and take a break. Back does not really hurt, pain is mainly at L posterior hip. Has a stack of HEP at home which include  B knee to chest, bridges, S/L hip abduction. Yoga: downward dog, warrior poses, lower trunk rotation, cobra pose (prone press-ups), prayer stretch, cat/camel, quadruped alternating shoulder flexion with contralatearl hip extension, quaduped hip extensions. L vastus lateralis feels more tight than the R. Could not sleep at night when L hip was bothering her. Pain began 2-3 years ago. Recently started horse back riding again but had to give it back. Also has osteoporosis. Has problems at L4/L5  PRECAUTIONS: Osteoporosis  SUBJECTIVE: Pt is walking ~2 miles.  She notices her aerobic conditioning has declined as she hasn't been able to walk this distance in awhile.  Her L lower back pain did not flare up, she feels a little tight in her L lower back.  She continues to work on her HEP regularly.    PAIN:  Are you having pain? 0/10 back pain currently.   OBJECTIVE MEASURES:  Objective measurements completed on examination: Hip abd strength R 4+/5, L 4/5  FOTO: 83 (obtained at previous visit)  Goals updated today (see below)    TREATMENT: 01/19/2022  Supine L piriformis stretch 30 seconds for 3 sets  Hook lying reverse crunch 10x3 with 5 second holds  Sidelying hip abd endurance: small 3 inch lift/lower x 10 reps, 2 sets R and L  Prone glute max extension, 2 pillows under abdomen  R 10x5 seconds  L 10x5 seconds  Side stepping red band around ankles 32 ft to the R and 32 ft to the L   Good glute med muscle use felt reported.   Squats with emphasis on glute max activation: 2x12  SLS on flat surface 5 sec x 5 ea LE SLS on airex 5 sec x 5 ea LE    Improved exercise technique, movement at target joints, use of target muscles after mod verbal, visual, tactile cues.     Response to treatment Pt tolerated session well without aggravation of symptoms.        Clinical impression  Pt demonstrates significant decrease in low back pain and improved ability to ambulate longer distances  more comfortably based on subjective reports. She has almost resumed her full walking program again.  Her gluteal muscle strength has improved.  She has nearly met all PT goals.  Pt will benefit from continued skilled physical therapy services to improve strength and function.  Anticipate D/C in next 1-2 visits.    PATIENT EDUCATION: Education details: there-ex, HEP Person educated: Patient Education method: Explanation, Demonstration, Tactile cues, Verbal cues, and Handouts Education comprehension: verbalized understanding and returned demonstration   HOME EXERCISE PROGRAM: Access Code: [email or web address] URL: [email or web address] Date: 12/16/2021 Prepared by: Joneen Boers  Exercises - Bilateral Bent Leg Lift  - 1 x daily - 7 x weekly - 2 sets - 10 reps - Standing Single Leg Stance with Unilateral Counter Support  - 2 x daily - 7 x weekly - 1 sets - 10 reps -  5 seconds hold  - Prone Quadriceps Set  - 1 x daily - 7 x weekly - 3 sets - 10 reps  - Prone Hip Extension with Bent Knee - Two Pillows  - 1 x daily - 7 x weekly - 3 sets - 10 reps  - Supine Piriformis Stretch with Foot on Ground  - 1 x daily - 7 x weekly - 1 sets - 3 reps - 30 seconds hold      PT Short Term Goals - 01/19/22 1526       PT SHORT TERM GOAL #1   Title Patient will be independent with her initial HEP to decrease pain, improve strength and ability to tolerate prolonged sitting, standing, and walking.    Time 3    Period Weeks    Status Achieved    Target Date 12/18/21              PT Long Term Goals - 01/19/22 1526       PT LONG TERM GOAL #1   Title Patient will have a decrease in L low back/posterior hip pain to 2/10 or less at worst to promote ability to perform standing chores, return to hiking, as well as drive longer distances more comfortably.    Baseline 9/10 L low back/posterior hip pain at worst for the past 3 months (11/26/2021); 01/19/22 met    Time 8    Period Weeks    Status  Achieved    Target Date 01/22/22      PT LONG TERM GOAL #2   Title Patient will improve her hip extension and abduction strength by at least 1/2 MMT grade to promote ability to perform standing tasks more comfortably.    Baseline Hip extension 4-/5 R and L, hip abduction 4/5 R, 4-/5 L (11/26/2021); 01/19/22 met (see note)    Time 8    Period Weeks    Status Achieved    Target Date 01/22/22      PT LONG TERM GOAL #3   Title Patient will improve her FOTO score by at least 10 points as a demonstration of improved function.    Baseline Lumbar Spine FOTO 53 (11/26/2021); 01/19/22 83    Time 8    Period Weeks    Status Achieved    Target Date 01/22/22      PT LONG TERM GOAL #4   Title Pt will be able to return to hiking about 3-4 miles with 2/10 L low back/posterior hip pain or less to promote fitness.    Baseline Currently walks 10 minutes at a time at home (11/26/2021); 01/19/22 started 2 miles    Time 8    Period Weeks    Status Partially Met    Target Date 01/22/22            Merdis Delay, PT, DPT, OCS  [phone]   01/19/2022, 3:28 PM    [facility] [address], [place], [address] Phone: [phone]   Fax:  [phone]  Patient Details  Name: Stacey Munoz MRN: [id number] Date of Birth: 11/16/1945 Referring Provider:  Orland Mustard *  Encounter Date: 01/19/2022   Pincus Badder, PT 01/19/2022, 3:28 PM  [facility] [address] [place], [address] Phone: [phone]   Fax:  [phone]

## 2022-01-22 ENCOUNTER — Ambulatory Visit: Payer: Medicare HMO

## 2022-01-22 DIAGNOSIS — M5459 Other low back pain: Secondary | ICD-10-CM

## 2022-01-22 DIAGNOSIS — M25552 Pain in left hip: Secondary | ICD-10-CM

## 2022-01-22 DIAGNOSIS — M5417 Radiculopathy, lumbosacral region: Secondary | ICD-10-CM | POA: Diagnosis not present

## 2022-01-22 NOTE — Therapy (Signed)
OUTPATIENT PHYSICAL THERAPY TREATMENT NOTE And Discharge Summary    Patient Name: Stacey Munoz MRN: [id number] DOB:11/09/1945, 76 y.o., female Today's Date: 01/22/2022  PCP: McLean-Scocuzza, Nino Glow, MD REFERRING PROVIDER: McLean-Scocuzza, Nino Glow, MD   PT End of Session - 01/22/22 1332     Visit Number 11    Number of Visits 17    Date for PT Re-Evaluation 01/22/22    Authorization Type 1    Authorization Time Period 10 progress report    PT Start Time 1333    PT Stop Time 1406    PT Time Calculation (min) 33 min    Activity Tolerance Patient tolerated treatment well    Behavior During Therapy [facility] for tasks assessed/performed                    Past Medical History:  Diagnosis Date   COVID-19    11/08/20   Hyperlipidemia    Low serum vitamin D    Osteoporosis    Past Surgical History:  Procedure Laterality Date   CATARACT EXTRACTION W/PHACO Right 09/16/2021   Procedure: CATARACT EXTRACTION PHACO AND INTRAOCULAR LENS PLACEMENT ([place]) RIGHT 3.80 00:36.0;  Surgeon: Birder Robson, MD;  Location: [place];  Service: Ophthalmology;  Laterality: Right;  needs to have an AM appointment   CATARACT EXTRACTION W/PHACO Left 09/30/2021   Procedure: CATARACT EXTRACTION PHACO AND INTRAOCULAR LENS PLACEMENT ([facility]) LEFT 5.17 00:33.9;  Surgeon: Birder Robson, MD;  Location: [place];  Service: Ophthalmology;  Laterality: Left;   COLONOSCOPY WITH PROPOFOL N/A 05/20/2021   Procedure: COLONOSCOPY WITH PROPOFOL;  Surgeon: Lucilla Lame, MD;  Location: [facility] ENDOSCOPY;  Service: Endoscopy;  Laterality: N/A;   FACIAL COSMETIC SURGERY     FRACTURE SURGERY     KNEE ARTHROSCOPY Right 01/2011   torn meniscus   ROOT CANAL Left    04/2021 surgery schedule   TRIGGER FINGER RELEASE Right 08/2014   TUBAL LIGATION     WRIST FRACTURE SURGERY     left   Patient Active Problem List   Diagnosis Date Noted   DDD (degenerative disc disease), lumbosacral 11/06/2021    Lumbar radiculopathy 11/06/2021   Uterine fibroid 11/06/2021   Hip arthritis 11/06/2021   Personal history of colonic polyps    Polyp of ascending colon    Positive colorectal cancer screening using Cologuard test 05/12/2021   Patient travels 12/27/2020   Arthritis of lumbar spine 10/01/2020   Left lumbar radiculopathy 04/02/2020   Prediabetes 04/03/2019   Thyroid nodule 03/22/2019   Osteoporosis 08/24/2017   DNR (do not resuscitate) discussion 05/23/2015   Diminished hearing 01/22/2015   Low hemoglobin 10/15/2014   Vitamin D deficiency 10/15/2014   Hyperlipidemia 10/15/2014   Retained orthopedic hardware 04/14/2013   Triggering of digit 02/03/2013   Closed fracture of lower end of left radius with malunion 02/03/2013    REFERRING DIAG: M54.16 (ICD-10-CM) - Lumbar radiculopathy  M53.3 (ICD-10-CM) - Pain in sacrum  M51.37 (ICD-10-CM) - DDD (degenerative disc disease), lumbosacral  M16.10 (ICD-10-CM) - Hip arthritis  M54.32 (ICD-10-CM) - Sciatica of left side  THERAPY DIAG:  Pain in left hip  Other low back pain  Radiculopathy, lumbosacral region  Rationale for Evaluation and Treatment Rehabilitation  PERTINENT HISTORY: Low back pain. Feels like L hip higher. Worked on realigning at [employer]. The out of alignment is pinching L LE nerve (L5 dermatome). L LE pain is now localized to L posterior hip. Going to a massage therapist for her hip.  Uses a back brace when she is long distance driving, and performing standing chores which help. Has a tendency to rotate her L hip forward. Normally walks 3-4 miles a day which aggravates her symptoms. Currently walks 10 minutes at a time at home now. Ices her L low back after walking. Does yoga regularly. Feels like she is sitting on her B iscial tuberosities when sitting down. Stops every 2 hours of long distance driving to stretch and take a break. Back does not really hurt, pain is mainly at L posterior hip. Has a stack of HEP at home  which include B knee to chest, bridges, S/L hip abduction. Yoga: downward dog, warrior poses, lower trunk rotation, cobra pose (prone press-ups), prayer stretch, cat/camel, quadruped alternating shoulder flexion with contralatearl hip extension, quaduped hip extensions. L vastus lateralis feels more tight than the R. Could not sleep at night when L hip was bothering her. Pain began 2-3 years ago. Recently started horse back riding again but had to give it back. Also has osteoporosis. Has problems at L4/L5  PRECAUTIONS: Osteoporosis  SUBJECTIVE: Had a 90 minute massage this morning. Walked 3.1 miles today and back felt great. Does not like the FOTO questions. Has been doing her HEP.    PAIN:  Are you having pain? 0/10 back pain currently.     TODAY'S TREATMENT:        TREATMENT: 01/22/2022  Reviewed progress with PT towards goals.   Reviewed POC: graduate today secondary to great progress   Hook lying reverse crunch 10x3 with 5 second holds  Supine L piriformis stretch 30 seconds  R piriformis 30 seconds  Hook lying hip extension isometrics, leg straight  L 10x3 with 5 second holds    R 10x3 with 5 second holds    Side stepping red band around ankles 32 ft to the R and 32 ft to the L   Good glute med muscle use felt reported.    Seated hip adduction isometrics pillow squeeze 10x5 seconds       Improved exercise technique, movement at target joints, use of target muscles after mod verbal, visual, tactile cues.     Response to treatment Pt tolerated session well without aggravation of symptoms.         Clinical impression  Pt demonstrates decreased back pain, improved bilateral hip strength and function and ability to ambulate up to at least 3 miles since initial evaluation. Pt has achieved all goals. Skilled physical therapy services discharged after recert for today's visit with patient continuing her progress with her exercises at home.             PATIENT EDUCATION: Education details: there-ex, HEP Person educated: Patient Education method: Explanation, Demonstration, Tactile cues, Verbal cues, and Handouts Education comprehension: verbalized understanding and returned demonstration   HOME EXERCISE PROGRAM: Access Code: [email or web address] URL: [email or web address] Date: 12/16/2021 Prepared by: Joneen Boers  Exercises - Bilateral Bent Leg Lift  - 1 x daily - 7 x weekly - 2 sets - 10 reps - Standing Single Leg Stance with Unilateral Counter Support  - 2 x daily - 7 x weekly - 1 sets - 10 reps - 5 seconds hold  - Prone Quadriceps Set  - 1 x daily - 7 x weekly - 3 sets - 10 reps  - Prone Hip Extension with Bent Knee - Two Pillows  - 1 x daily - 7 x weekly - 3 sets - 10 reps  - Supine Piriformis Stretch with Foot  on Ground  - 1 x daily - 7 x weekly - 1 sets - 3 reps - 30 seconds hold      PT Short Term Goals - 01/19/22 1526       PT SHORT TERM GOAL #1   Title Patient will be independent with her initial HEP to decrease pain, improve strength and ability to tolerate prolonged sitting, standing, and walking.    Time 3    Period Weeks    Status Achieved    Target Date 12/18/21              PT Long Term Goals - 01/22/22 1411       PT LONG TERM GOAL #1   Title Patient will have a decrease in L low back/posterior hip pain to 2/10 or less at worst to promote ability to perform standing chores, return to hiking, as well as drive longer distances more comfortably.    Baseline 9/10 L low back/posterior hip pain at worst for the past 3 months (11/26/2021); 01/19/22 met    Time 8    Period Weeks    Status Achieved    Target Date 01/22/22      PT LONG TERM GOAL #2   Title Patient will improve her hip extension and abduction strength by at least 1/2 MMT grade to promote ability to perform standing tasks more comfortably.    Baseline Hip extension 4-/5 R and L, hip abduction 4/5 R, 4-/5 L (11/26/2021);  01/19/22 met (see note)    Time 8    Period Weeks    Status Achieved    Target Date 01/22/22      PT LONG TERM GOAL #3   Title Patient will improve her FOTO score by at least 10 points as a demonstration of improved function.    Baseline Lumbar Spine FOTO 53 (11/26/2021); 01/19/22 83    Time 8    Period Weeks    Status Achieved    Target Date 01/22/22      PT LONG TERM GOAL #4   Title Pt will be able to return to hiking about 3-4 miles with 2/10 L low back/posterior hip pain or less to promote fitness.    Baseline Currently walks 10 minutes at a time at home (11/26/2021); 01/19/22 started 2 miles; Able to walk 3.1 miles without her back bothering her (01/22/2022)    Time 8    Period Weeks    Status Achieved    Target Date 01/22/22              Plan - 01/22/22 1331     Clinical Impression Statement Pt demonstrates decreased back pain, improved bilateral hip strength and function and ability to ambulate up to at least 3 miles since initial evaluation. Pt has achieved all goals. Skilled physical therapy services discharged after recert for today's visit with patient continuing her progress with her exercises at home.    Personal Factors and Comorbidities Comorbidity 2;Age;Time since onset of injury/illness/exacerbation    Comorbidities Age, osteoporosis    Examination-Activity Limitations Stand;Sit;Bend;Lift;Locomotion Level    Stability/Clinical Decision Making Stable/Uncomplicated    Clinical Decision Making Low    Rehab Potential --    Clinical Impairments Affecting Rehab Potential (+) motivated; (-) age, osteoporosis, chronicity of condition    PT Frequency --    PT Duration --    PT Treatment/Interventions Therapeutic exercise;Therapeutic activities;Neuromuscular re-education;Patient/family education;Manual techniques    PT Next Visit Plan Continue progress with her HEP  Consulted and Agree with Plan of Care Patient                   Thank you for your  referral.  Joneen Boers PT, DPT  01/22/2022, 2:52 PM

## 2022-01-27 ENCOUNTER — Ambulatory Visit: Payer: Medicare HMO

## 2022-02-02 DIAGNOSIS — H60332 Swimmer's ear, left ear: Secondary | ICD-10-CM | POA: Diagnosis not present

## 2022-02-02 DIAGNOSIS — J019 Acute sinusitis, unspecified: Secondary | ICD-10-CM | POA: Diagnosis not present

## 2022-02-12 DIAGNOSIS — M5136 Other intervertebral disc degeneration, lumbar region: Secondary | ICD-10-CM | POA: Diagnosis not present

## 2022-02-12 DIAGNOSIS — M62830 Muscle spasm of back: Secondary | ICD-10-CM | POA: Diagnosis not present

## 2022-02-12 DIAGNOSIS — M5416 Radiculopathy, lumbar region: Secondary | ICD-10-CM | POA: Diagnosis not present

## 2022-02-12 DIAGNOSIS — M9903 Segmental and somatic dysfunction of lumbar region: Secondary | ICD-10-CM | POA: Diagnosis not present

## 2022-02-19 ENCOUNTER — Encounter: Payer: Self-pay | Admitting: Family Medicine

## 2022-02-19 ENCOUNTER — Ambulatory Visit (INDEPENDENT_AMBULATORY_CARE_PROVIDER_SITE_OTHER): Payer: Medicare HMO | Admitting: Family Medicine

## 2022-02-19 VITALS — BP 124/78 | HR 79 | Temp 97.5°F | Ht 64.0 in | Wt 133.6 lb

## 2022-02-19 DIAGNOSIS — D649 Anemia, unspecified: Secondary | ICD-10-CM

## 2022-02-19 DIAGNOSIS — Z23 Encounter for immunization: Secondary | ICD-10-CM

## 2022-02-19 DIAGNOSIS — M810 Age-related osteoporosis without current pathological fracture: Secondary | ICD-10-CM

## 2022-02-19 DIAGNOSIS — E559 Vitamin D deficiency, unspecified: Secondary | ICD-10-CM | POA: Diagnosis not present

## 2022-02-19 DIAGNOSIS — H60332 Swimmer's ear, left ear: Secondary | ICD-10-CM | POA: Diagnosis not present

## 2022-02-19 DIAGNOSIS — J019 Acute sinusitis, unspecified: Secondary | ICD-10-CM | POA: Diagnosis not present

## 2022-02-19 DIAGNOSIS — E041 Nontoxic single thyroid nodule: Secondary | ICD-10-CM | POA: Diagnosis not present

## 2022-02-19 DIAGNOSIS — R7303 Prediabetes: Secondary | ICD-10-CM

## 2022-02-19 DIAGNOSIS — E782 Mixed hyperlipidemia: Secondary | ICD-10-CM | POA: Diagnosis not present

## 2022-02-19 NOTE — Progress Notes (Addendum)
    SUBJECTIVE:   CHIEF COMPLAINT / HPI: Transfer of care  Patient presents to clinic to transfer care.  No acute concerns today.  Osteoporosis Asymptomatic.  Currently taking calcium 1000 mg daily and vitamin D 20 mcg daily.  Has not had any falls or injuries recently.  DEXA scan up-to-date.  Patient reports recently seen by ENT for sinusitis.  Completed course of amoxicillin.  Also noted hematoma to left ear.  Has follow-up with ENT later today.  PERTINENT  PMH / PSH:  Thyroid nodule Vitamin D deficiency Hyperlipidemia Prediabetes Anemia  OBJECTIVE:   BP 124/78 (BP Location: Left Arm, Patient Position: Sitting, Cuff Size: Normal)   Pulse 79   Temp (!) 97.5 F (36.4 C) (Oral)   Ht '5\' 4"'$  (1.626 m)   Wt 133 lb 9.6 oz (60.6 kg)   SpO2 98%   BMI 22.93 kg/m    General: Alert, no acute distress Cardio: Normal S1 and S2, RRR, no r/m/g Pulm: CTAB, normal work of breathing Abdomen: Bowel sounds present.  Abdomen soft. Extremities: No peripheral edema.  Neuro: Cranial nerves grossly intact  ASSESSMENT/PLAN:   Osteoporosis Recommendations include calcium 1200 mg daily and vitamin D 800 IU daily. DEXA up-to-date -Continue taking calcium 1000 mg daily along with calcium daily for months. -Continue taking vitamin D 20 mcg daily.    HCM Declined Medicare AWV COVID up-to-date, received at [facility] 01/17/2022 Eye exam up-to-date 6/23 Mammogram up-to-date, due 06/24 Colonoscopy up-to-date, 04/2021.  No further screening recommended. Recommend tetanus booster Recommend RSV High-dose flu vaccine today Pneumonia 20 vaccine today  Annual labs ordered for future visit as scheduled. PDMP reviewed  Carollee Leitz, MD

## 2022-02-19 NOTE — Patient Instructions (Signed)
It was a pleasure meeting you today. Thank you for allowing me to take part in your health care.  Our goals for today as we discussed include:  You received the High dose Flu vaccine today You received the Pneumonia 20 vaccine  Recommend RSV vaccine.  This is given at the pharmacy. Recommend Tetanus booster,  last given in 2013.  Recommend Calcium 1200 mg daily and Vitamin D 800 IU daily  Follow up with ENT as scheduled  Will place orders for fasting labs to have completed on the day of your Annual Wellness Visit  Please follow-up with PCP May 26 2022  If you have any questions or concerns, please do not hesitate to call the office at [phone].  I look forward to our next visit and until then take care and stay safe.  Regards,   Carollee Leitz, MD   [facility]

## 2022-03-02 ENCOUNTER — Encounter: Payer: Self-pay | Admitting: Family Medicine

## 2022-03-02 NOTE — Assessment & Plan Note (Signed)
Recommendations include calcium 1200 mg daily and vitamin D 800 IU daily. DEXA up-to-date -Continue taking calcium 1000 mg daily along with calcium daily for months. -Continue taking vitamin D 20 mcg daily.

## 2022-03-10 DIAGNOSIS — M9903 Segmental and somatic dysfunction of lumbar region: Secondary | ICD-10-CM | POA: Diagnosis not present

## 2022-03-10 DIAGNOSIS — M62830 Muscle spasm of back: Secondary | ICD-10-CM | POA: Diagnosis not present

## 2022-03-10 DIAGNOSIS — M5416 Radiculopathy, lumbar region: Secondary | ICD-10-CM | POA: Diagnosis not present

## 2022-03-10 DIAGNOSIS — M5136 Other intervertebral disc degeneration, lumbar region: Secondary | ICD-10-CM | POA: Diagnosis not present

## 2022-03-12 NOTE — Telephone Encounter (Signed)
Patient came into the office today. She wanted to change providers, because of her visit with Dr Volanda Napoleon.  Patient wrote; The summary lists that my cardio, abdomen, extremities and neuro were checked during my initial visit yet the only things checked were my lungs. Meanwhile, I have looked online and (1) it appears that Dr. Volanda Napoleon is listed as "Non-participating providers are those who have not signed an agreement to accept assignment for all Medicare-covered services, but they can still choose to accept assignment for individual services." and (2) she is NOT listed with my [employer].  Thus, having her as my GP has me very concerned.  As I have seen Stacey Munoz. Stacey Bis, MD previously, I would prefer to have him as my GP. Stacey Munoz  She is requesting that the wrong information be removed and would like to establish with another provider at office. Patient was advised that the office manager would call her.

## 2022-03-24 NOTE — Telephone Encounter (Signed)
Pt called back and she has seen sonnenberg in Oct 2022. Pt stated she could not respond on mychart. It would not let her

## 2022-03-30 NOTE — Telephone Encounter (Signed)
Would you be willing to accept this patient she is not happy with last visit she says she has seen you in the past and prefers you as provider.

## 2022-03-30 NOTE — Telephone Encounter (Signed)
That's fine with me. I saw her previously for a pretravel physical.

## 2022-04-13 DIAGNOSIS — M5416 Radiculopathy, lumbar region: Secondary | ICD-10-CM | POA: Diagnosis not present

## 2022-04-13 DIAGNOSIS — M9903 Segmental and somatic dysfunction of lumbar region: Secondary | ICD-10-CM | POA: Diagnosis not present

## 2022-04-13 DIAGNOSIS — M62830 Muscle spasm of back: Secondary | ICD-10-CM | POA: Diagnosis not present

## 2022-04-13 DIAGNOSIS — M5136 Other intervertebral disc degeneration, lumbar region: Secondary | ICD-10-CM | POA: Diagnosis not present

## 2022-05-07 DIAGNOSIS — M62830 Muscle spasm of back: Secondary | ICD-10-CM | POA: Diagnosis not present

## 2022-05-07 DIAGNOSIS — M9903 Segmental and somatic dysfunction of lumbar region: Secondary | ICD-10-CM | POA: Diagnosis not present

## 2022-05-07 DIAGNOSIS — M5136 Other intervertebral disc degeneration, lumbar region: Secondary | ICD-10-CM | POA: Diagnosis not present

## 2022-05-07 DIAGNOSIS — M5416 Radiculopathy, lumbar region: Secondary | ICD-10-CM | POA: Diagnosis not present

## 2022-05-08 ENCOUNTER — Other Ambulatory Visit (INDEPENDENT_AMBULATORY_CARE_PROVIDER_SITE_OTHER): Payer: Medicare Other

## 2022-05-08 DIAGNOSIS — M9903 Segmental and somatic dysfunction of lumbar region: Secondary | ICD-10-CM | POA: Diagnosis not present

## 2022-05-08 DIAGNOSIS — M62830 Muscle spasm of back: Secondary | ICD-10-CM | POA: Diagnosis not present

## 2022-05-08 DIAGNOSIS — D649 Anemia, unspecified: Secondary | ICD-10-CM | POA: Diagnosis not present

## 2022-05-08 DIAGNOSIS — E041 Nontoxic single thyroid nodule: Secondary | ICD-10-CM | POA: Diagnosis not present

## 2022-05-08 DIAGNOSIS — E559 Vitamin D deficiency, unspecified: Secondary | ICD-10-CM

## 2022-05-08 DIAGNOSIS — R7303 Prediabetes: Secondary | ICD-10-CM

## 2022-05-08 DIAGNOSIS — E782 Mixed hyperlipidemia: Secondary | ICD-10-CM | POA: Diagnosis not present

## 2022-05-08 DIAGNOSIS — M5136 Other intervertebral disc degeneration, lumbar region: Secondary | ICD-10-CM | POA: Diagnosis not present

## 2022-05-08 DIAGNOSIS — M5416 Radiculopathy, lumbar region: Secondary | ICD-10-CM | POA: Diagnosis not present

## 2022-05-08 LAB — URINALYSIS, ROUTINE W REFLEX MICROSCOPIC
Bilirubin Urine: NEGATIVE
Hgb urine dipstick: NEGATIVE
Ketones, ur: NEGATIVE
Leukocytes,Ua: NEGATIVE
Nitrite: NEGATIVE
RBC / HPF: NONE SEEN (ref 0–?)
Specific Gravity, Urine: <=1.005 — AB (ref 1.000–1.030)
Total Protein, Urine: NEGATIVE
Urine Glucose: NEGATIVE
Urobilinogen, UA: 0.2 (ref 0.0–1.0)
pH: 6 (ref 5.0–8.0)

## 2022-05-08 LAB — CBC WITH DIFFERENTIAL/PLATELET
Basophils Absolute: 0 10*3/uL (ref 0.0–0.1)
Basophils Relative: 0.7 % (ref 0.0–3.0)
Eosinophils Absolute: 0.2 10*3/uL (ref 0.0–0.7)
Eosinophils Relative: 4.6 % (ref 0.0–5.0)
HCT: 39.6 % (ref 36.0–46.0)
Hemoglobin: 13.4 g/dL (ref 12.0–15.0)
Lymphocytes Relative: 41.4 % (ref 12.0–46.0)
Lymphs Abs: 1.6 10*3/uL (ref 0.7–4.0)
MCHC: 34 g/dL (ref 30.0–36.0)
MCV: 92 fl (ref 78.0–100.0)
Monocytes Absolute: 0.3 10*3/uL (ref 0.1–1.0)
Monocytes Relative: 7.4 % (ref 3.0–12.0)
Neutro Abs: 1.7 10*3/uL (ref 1.4–7.7)
Neutrophils Relative %: 45.9 % (ref 43.0–77.0)
Platelets: 195 10*3/uL (ref 150.0–400.0)
RBC: 4.3 Mil/uL (ref 3.87–5.11)
RDW: 13.6 % (ref 11.5–15.5)
WBC: 3.8 10*3/uL — ABNORMAL LOW (ref 4.0–10.5)

## 2022-05-08 LAB — LIPID PANEL
Cholesterol: 277 mg/dL — ABNORMAL HIGH (ref 0–200)
HDL: 59.3 mg/dL (ref >39.00)
NonHDL: 217.69
Total CHOL/HDL Ratio: 5
Triglycerides: 293 mg/dL — ABNORMAL HIGH (ref 0.0–149.0)
VLDL: 58.6 mg/dL — ABNORMAL HIGH (ref 0.0–40.0)

## 2022-05-08 LAB — VITAMIN D 25 HYDROXY (VIT D DEFICIENCY, FRACTURES): VITD: 32.52 ng/mL (ref 30.00–100.00)

## 2022-05-08 LAB — LDL CHOLESTEROL, DIRECT: Direct LDL: 156 mg/dL

## 2022-05-08 LAB — COMPREHENSIVE METABOLIC PANEL
ALT: 16 U/L (ref 0–35)
AST: 17 U/L (ref 0–37)
Albumin: 4.2 g/dL (ref 3.5–5.2)
Alkaline Phosphatase: 68 U/L (ref 39–117)
BUN: 13 mg/dL (ref 6–23)
CO2: 30 mEq/L (ref 19–32)
Calcium: 9.6 mg/dL (ref 8.4–10.5)
Chloride: 104 mEq/L (ref 96–112)
Creatinine, Ser: 0.76 mg/dL (ref 0.40–1.20)
GFR: 75.66 mL/min (ref >60.00)
Glucose, Bld: 99 mg/dL (ref 70–99)
Potassium: 3.8 mEq/L (ref 3.5–5.1)
Sodium: 140 mEq/L (ref 135–145)
Total Bilirubin: 0.7 mg/dL (ref 0.2–1.2)
Total Protein: 6.6 g/dL (ref 6.0–8.3)

## 2022-05-08 LAB — HEMOGLOBIN A1C: Hgb A1c MFr Bld: 5.9 % (ref 4.6–6.5)

## 2022-05-08 LAB — VITAMIN B12: Vitamin B-12: 375 pg/mL (ref 211–911)

## 2022-05-08 LAB — TSH: TSH: 2.2 u[IU]/mL (ref 0.35–5.50)

## 2022-05-09 ENCOUNTER — Encounter: Payer: Self-pay | Admitting: Family Medicine

## 2022-05-13 ENCOUNTER — Ambulatory Visit (INDEPENDENT_AMBULATORY_CARE_PROVIDER_SITE_OTHER): Payer: Medicare Other | Admitting: Family Medicine

## 2022-05-13 ENCOUNTER — Telehealth: Payer: Self-pay | Admitting: Family Medicine

## 2022-05-13 ENCOUNTER — Encounter: Payer: Self-pay | Admitting: Family Medicine

## 2022-05-13 VITALS — BP 120/80 | HR 96 | Temp 97.3°F | Ht 64.0 in | Wt 133.0 lb

## 2022-05-13 DIAGNOSIS — E041 Nontoxic single thyroid nodule: Secondary | ICD-10-CM | POA: Diagnosis not present

## 2022-05-13 DIAGNOSIS — E782 Mixed hyperlipidemia: Secondary | ICD-10-CM

## 2022-05-13 DIAGNOSIS — R7303 Prediabetes: Secondary | ICD-10-CM | POA: Diagnosis not present

## 2022-05-13 DIAGNOSIS — E559 Vitamin D deficiency, unspecified: Secondary | ICD-10-CM

## 2022-05-13 MED ORDER — TETANUS-DIPHTHERIA TOXOIDS TD 5-2 LFU IM INJ: 0.5000 mL | INJECTION | Freq: Once | INTRAMUSCULAR | 0 refills | Status: AC

## 2022-05-13 MED ORDER — ROSUVASTATIN CALCIUM 10 MG PO TABS: 10.0000 mg | ORAL_TABLET | Freq: Every day | ORAL | 3 refills | Status: DC

## 2022-05-13 NOTE — Progress Notes (Signed)
88 

## 2022-05-13 NOTE — Assessment & Plan Note (Signed)
Chronic issue.  We will start Crestor 10 mg daily.  She will monitor for myalgias and other side effects.  Discussed the potential that this could cause her liver enzymes to go up.  Discussed significant liver issues are rare with this kind of medication.  Advised if she starts having stomach pain or jaundice that she needs to let us know.  Will recheck her labs in 6 weeks.  If she is not able to tolerate the Crestor 10 mg she will let us know.

## 2022-05-13 NOTE — Progress Notes (Signed)
Tommi Rumps, MD Phone: [phone]  Stacey Munoz is a 77 y.o. female who presents today for follow-up.  Hyperlipidemia: Patient notes many years ago she was on a statin and took it for 6 months.  It made her feel poorly.  She is not sure which one it was.  Her son has CAD.  Her mother had hyperlipidemia and her father had a heart operation.  She does stick to a plant-based diet and walks 2 miles daily.  Prediabetes: Patient notes that she was not eating all that well over the holidays.  Vitamin D deficiency: Vitamin D level was acceptable.  She does take a supplement for this.  Thyroid nodules: Patient had ultrasound and biopsy of her left-sided thyroid nodules in 2019.  The 10-year ASCVD risk score (Arnett DK, et al., 2019) is: 17.8%   Values used to calculate the score:     Age: 57 years     Sex: Female     Is Non-Hispanic African American: No     Diabetic: No     Tobacco smoker: No     Systolic Blood Pressure: 426 mmHg     Is BP treated: No     HDL Cholesterol: 59.3 mg/dL     Total Cholesterol: 277 mg/dL   Social History   Tobacco Use  Smoking Status Never  Smokeless Tobacco Never  Tobacco Comments   smoking cessation materials not required    Current Outpatient Medications on File Prior to Visit  Medication Sig Dispense Refill   CALCIUM PO Take 1,000 mg by mouth daily at 12 noon.     Cholecalciferol (VITAMIN D3 PO) Take 20 mcg by mouth daily at 12 noon.     Flaxseed Oil OIL Take 30 mLs by mouth daily at 12 noon. 2 tablespoons = 30 mL     Misc Natural Products (ELDERBERRY ZINC/VIT C/IMMUNE MT) Use as directed in the mouth or throat daily at 12 noon.     Multiple Vitamins-Minerals (CENTRUM SILVER PO) Take by mouth daily at 12 noon.     No current facility-administered medications on file prior to visit.     ROS see history of present illness  Objective  Physical Exam Vitals:   05/13/22 0926 05/13/22 0931  BP: (!) 140/80 120/80  Pulse: 96   Temp: (!)  97.3 F (36.3 C)   SpO2: 98%     BP Readings from Last 3 Encounters:  05/13/22 120/80  02/19/22 124/78  11/06/21 120/70   Wt Readings from Last 3 Encounters:  05/13/22 133 lb (60.3 kg)  02/19/22 133 lb 9.6 oz (60.6 kg)  11/06/21 131 lb 12.8 oz (59.8 kg)    Physical Exam Constitutional:      General: She is not in acute distress.    Appearance: She is not diaphoretic.  Neck:     Comments: Possible right-sided thyroid nodule palpated Cardiovascular:     Rate and Rhythm: Normal rate and regular rhythm.     Heart sounds: Normal heart sounds.  Pulmonary:     Effort: Pulmonary effort is normal.     Breath sounds: Normal breath sounds.  Neurological:     Mental Status: She is alert.      Assessment/Plan: Please see individual problem list.  Thyroid nodule Assessment & Plan: Check thyroid ultrasound to evaluate for potential right-sided thyroid nodule.  Orders: -     US THYROID; Future  Mixed hyperlipidemia Assessment & Plan: Chronic issue.  We will start Crestor 10 mg daily.  She  will monitor for myalgias and other side effects.  Discussed the potential that this could cause her liver enzymes to go up.  Discussed significant liver issues are rare with this kind of medication.  Advised if she starts having stomach pain or jaundice that she needs to let us know.  Will recheck her labs in 6 weeks.  If she is not able to tolerate the Crestor 10 mg she will let us know.  Orders: -     Rosuvastatin Calcium; Take 1 tablet (10 mg total) by mouth daily.  Dispense: 90 tablet; Refill: 3 -     Hepatic function panel; Future -     LDL cholesterol, direct; Future  Vitamin D deficiency Assessment & Plan: Chronic issue.  Patient will continue her over-the-counter vitamin D supplement.   Prediabetes Assessment & Plan: Chronic issue.  Patient will continue healthy diet and exercise.  Will continue to monitor this periodically.   Other orders -     Tetanus-Diphtheria Toxoids Td;  Inject 0.5 mLs into the muscle once for 1 dose.  Dispense: 0.5 mL; Refill: 0     Health Maintenance: Patient will get her tetanus vaccine at the pharmacy.  MOST form was reviewed.  Return in about 6 weeks (around 06/24/2022) for Labs, follow-up with PCP 6 months.   Tommi Rumps, MD [place]

## 2022-05-13 NOTE — Assessment & Plan Note (Signed)
Chronic issue.  Patient will continue healthy diet and exercise.  Will continue to monitor this periodically.

## 2022-05-13 NOTE — Assessment & Plan Note (Signed)
Check thyroid ultrasound to evaluate for potential right-sided thyroid nodule.

## 2022-05-13 NOTE — Telephone Encounter (Signed)
Lft pt vm to call [phone] press option 3 and then 2 to sch . thanks

## 2022-05-13 NOTE — Patient Instructions (Signed)
Nice to see you. Please get your tetanus vaccine at the pharmacy. We are going to start Crestor 10 mg once daily.  If you have any side effects with this please let us know. We will recheck your labs in 6 weeks. Somebody will call you to schedule the ultrasound of your thyroid.

## 2022-05-13 NOTE — Assessment & Plan Note (Signed)
Chronic issue.  Patient will continue her over-the-counter vitamin D supplement.

## 2022-05-14 ENCOUNTER — Ambulatory Visit: Payer: Medicare HMO | Admitting: Dermatology

## 2022-05-14 DIAGNOSIS — M62830 Muscle spasm of back: Secondary | ICD-10-CM | POA: Diagnosis not present

## 2022-05-14 DIAGNOSIS — M9903 Segmental and somatic dysfunction of lumbar region: Secondary | ICD-10-CM | POA: Diagnosis not present

## 2022-05-14 DIAGNOSIS — M5136 Other intervertebral disc degeneration, lumbar region: Secondary | ICD-10-CM | POA: Diagnosis not present

## 2022-05-14 DIAGNOSIS — M5416 Radiculopathy, lumbar region: Secondary | ICD-10-CM | POA: Diagnosis not present

## 2022-05-18 ENCOUNTER — Ambulatory Visit: Payer: Medicare Other | Admitting: Dermatology

## 2022-05-18 VITALS — BP 117/66 | HR 71

## 2022-05-18 DIAGNOSIS — L209 Atopic dermatitis, unspecified: Secondary | ICD-10-CM | POA: Diagnosis not present

## 2022-05-18 DIAGNOSIS — L821 Other seborrheic keratosis: Secondary | ICD-10-CM | POA: Diagnosis not present

## 2022-05-18 DIAGNOSIS — D485 Neoplasm of uncertain behavior of skin: Secondary | ICD-10-CM

## 2022-05-18 DIAGNOSIS — L578 Other skin changes due to chronic exposure to nonionizing radiation: Secondary | ICD-10-CM

## 2022-05-18 DIAGNOSIS — T7840XA Allergy, unspecified, initial encounter: Secondary | ICD-10-CM

## 2022-05-18 DIAGNOSIS — L719 Rosacea, unspecified: Secondary | ICD-10-CM

## 2022-05-18 DIAGNOSIS — L308 Other specified dermatitis: Secondary | ICD-10-CM

## 2022-05-18 DIAGNOSIS — Z79899 Other long term (current) drug therapy: Secondary | ICD-10-CM

## 2022-05-18 DIAGNOSIS — L570 Actinic keratosis: Secondary | ICD-10-CM | POA: Diagnosis not present

## 2022-05-18 DIAGNOSIS — L814 Other melanin hyperpigmentation: Secondary | ICD-10-CM

## 2022-05-18 MED ORDER — OPZELURA 1.5 % EX CREA: TOPICAL_CREAM | CUTANEOUS | 0 refills | Status: DC

## 2022-05-18 MED ORDER — AZELAIC ACID 15 % EX GEL: CUTANEOUS | 1 refills | Status: AC

## 2022-05-18 NOTE — Patient Instructions (Addendum)
Start Opzelura cream to affected areas of scale on the face in the morning. Ok to use CeraVe cream moisturizer over top of Opzelura.   Start [facility] rosacea mix to rosacea areas on the face at night.   Instructions for [facility] Medications  One or more of your medications was sent to the [facility] mail order compounding pharmacy. You will receive an email from them and can purchase the medicine through that link. It will then be mailed to your home at the address you confirmed. If for any reason you do not receive an email from them, please check your spam folder. If you still do not find the email, please let us know. [facility] phone number is [phone].  Wound Care Instructions  Cleanse wound gently with soap and water once a day then pat dry with clean gauze. Apply a thin coat of Petrolatum (petroleum jelly, "Vaseline") over the wound (unless you have an allergy to this). We recommend that you use a new, sterile tube of Vaseline. Do not pick or remove scabs. Do not remove the yellow or white "healing tissue" from the base of the wound.  Cover the wound with fresh, clean, nonstick gauze and secure with paper tape. You may use Band-Aids in place of gauze and tape if the wound is small enough, but would recommend trimming much of the tape off as there is often too much. Sometimes Band-Aids can irritate the skin.  You should call the office for your biopsy report after 1 week if you have not already been contacted.  If you experience any problems, such as abnormal amounts of bleeding, swelling, significant bruising, significant pain, or evidence of infection, please call the office immediately.  FOR ADULT SURGERY PATIENTS: If you need something for pain relief you may take 1 extra strength Tylenol (acetaminophen) AND 2 Ibuprofen ('200mg'$  each) together every 4 hours as needed for pain. (do not take these if you are allergic to them or if you have a reason you should not  take them.) Typically, you may only need pain medication for 1 to 3 days.     Due to recent changes in healthcare laws, you may see results of your pathology and/or laboratory studies on MyChart before the doctors have had a chance to review them. We understand that in some cases there may be results that are confusing or concerning to you. Please understand that not all results are received at the same time and often the doctors may need to interpret multiple results in order to provide you with the best plan of care or course of treatment. Therefore, we ask that you please give [place] 2 business days to thoroughly review all your results before contacting the office for clarification. Should we see a critical lab result, you will be contacted sooner.   If You Need Anything After Your Visit  If you have any questions or concerns for your doctor, please call our main line at [phone] and press option 4 to reach your doctor's medical assistant. If no one answers, please leave a voicemail as directed and we will return your call as soon as possible. Messages left after 4 pm will be answered the following business day.   You may also send [place] a message via [place]. We typically respond to MyChart messages within 1-2 business days.  For prescription refills, please ask your pharmacy to contact our office. Our fax number is [phone].  If you have an urgent issue when the  clinic is closed that cannot wait until the next business day, you can page your doctor at the number below.    Please note that while we do our best to be available for urgent issues outside of office hours, we are not available 24/7.   If you have an urgent issue and are unable to reach [place], you may choose to seek medical care at your doctor's office, retail clinic, urgent care center, or emergency room.  If you have a medical emergency, please immediately call 911 or go to the emergency department.  Pager Numbers  - Dr.  Nehemiah Massed: [phone]  - Dr. Laurence Ferrari: [phone]  - Dr. Nicole Kindred: [phone]  In the event of inclement weather, please call our main line at [phone] for an update on the status of any delays or closures.  Dermatology Medication Tips: Please keep the boxes that topical medications come in in order to help keep track of the instructions about where and how to use these. Pharmacies typically print the medication instructions only on the boxes and not directly on the medication tubes.   If your medication is too expensive, please contact our office at [phone] option 4 or send [place] a message through [place].   We are unable to tell what your co-pay for medications will be in advance as this is different depending on your insurance coverage. However, we may be able to find a substitute medication at lower cost or fill out paperwork to get insurance to cover a needed medication.   If a prior authorization is required to get your medication covered by your insurance company, please allow [place] 1-2 business days to complete this process.  Drug prices often vary depending on where the prescription is filled and some pharmacies may offer cheaper prices.  The website www.goodrx.com contains coupons for medications through different pharmacies. The prices here do not account for what the cost may be with help from insurance (it may be cheaper with your insurance), but the website can give you the price if you did not use any insurance.  - You can print the associated coupon and take it with your prescription to the pharmacy.  - You may also stop by our office during regular business hours and pick up a GoodRx coupon card.  - If you need your prescription sent electronically to a different pharmacy, notify our office through [facility] or by phone at [phone] option 4.     Si Usted Necesita Algo Despus de Su Visita  Tambin puede enviarnos un mensaje a travs de Pharmacist, community. Por lo  general respondemos a los mensajes de MyChart en el transcurso de 1 a 2 das hbiles.  Para renovar recetas, por favor pida a su farmacia que se ponga en contacto con nuestra oficina. Harland Dingwall de fax es [place] [phone].  Si tiene un asunto urgente cuando la clnica est cerrada y que no puede esperar hasta el siguiente da hbil, puede llamar/localizar a su doctor(a) al nmero que aparece a continuacin.   Por favor, tenga en cuenta que aunque hacemos todo lo posible para estar disponibles para asuntos urgentes fuera del horario de [place], no estamos disponibles las 24 horas del da, los 7 das de la [place].   Si tiene un problema urgente y no puede comunicarse con nosotros, puede optar por buscar atencin mdica  en el consultorio de su doctor(a), en una clnica privada, en un centro de atencin urgente o en una sala de emergencias.  Si tiene [employer]  emergencia mdica, por favor llame inmediatamente al 911 o vaya a la sala de emergencias.  Nmeros de bper  - Dr. Nehemiah Massed: [phone]  - Dra. Moye: [phone]  - Dra. Nicole Kindred: [phone]  En caso de inclemencias del [place], por favor llame a Johnsie Kindred principal al [phone] para una actualizacin sobre el [place] de cualquier retraso o cierre.  Consejos para la medicacin en dermatologa: Por favor, guarde las cajas en las que vienen los medicamentos de uso tpico para ayudarle a seguir las instrucciones sobre dnde y cmo usarlos. Las farmacias generalmente imprimen las instrucciones del medicamento slo en las cajas y no directamente en los tubos del [place].   Si su medicamento es muy caro, por favor, pngase en contacto con Zigmund Daniel llamando al [phone] y presione la opcin 4 o envenos un mensaje a travs de Pharmacist, community.   No podemos decirle cul ser su copago por los medicamentos por adelantado ya que esto es diferente dependiendo de la cobertura de su seguro. Sin embargo, es posible que podamos encontrar un  medicamento sustituto a Electrical engineer un formulario para que el seguro cubra el medicamento que se considera necesario.   Si se requiere una autorizacin previa para que su compaa de seguros [place] su medicamento, por favor permtanos de 1 a 2 das hbiles para completar este proceso.  Los precios de los medicamentos varan con frecuencia dependiendo del Environmental consultant de dnde se surte la receta y alguna farmacias pueden ofrecer precios ms baratos.  El sitio web www.goodrx.com tiene cupones para medicamentos de Airline pilot. Los precios aqu no tienen en cuenta lo que podra costar con la ayuda del seguro (puede ser ms barato con su seguro), pero el sitio web puede darle el precio si no utiliz Research scientist (physical sciences).  - Puede imprimir el cupn correspondiente y llevarlo con su receta a la farmacia.  - Tambin puede pasar por nuestra oficina durante el horario de atencin regular y Charity fundraiser una tarjeta de cupones de GoodRx.  - Si necesita que su receta se enve electrnicamente a una farmacia diferente, informe a nuestra oficina a travs de MyChart de [place] o por telfono llamando al [phone] y presione la opcin 4.

## 2022-05-18 NOTE — Progress Notes (Signed)
New Patient Visit  Subjective  Stacey Munoz is a 77 y.o. female who presents for the following: Rosacea (Dx in 2002 but hasn't had issues until about two years ago. Pt c/o erythema and scale but no papules. She brought her skin care products with her today) and Itching (On the L back/flank, intense, comes and goes, "drives her nuts", thinks it may be an allergic reaction to goose down duvet, started about 9 mths ago).  The following portions of the chart were reviewed this encounter and updated as appropriate:   Tobacco  Allergies  Meds  Problems  Med Hx  Surg Hx  Fam Hx     Review of Systems:  No other skin or systemic complaints except as noted in HPI or Assessment and Plan.  Objective  Well appearing patient in no apparent distress; mood and affect are within normal limits.  A focused examination was performed including the face and back. Relevant physical exam findings are noted in the Assessment and Plan.  Face Erythema and scale.     L back sup Dermatographism, pink annular plaque. Inf pink boarder Sup center of lesion     L back inf Dermatographism, pink annular plaque.  Face Scaly patches.   Face Erythematous thin papules/macules with gritty scale.    Assessment & Plan  Rosacea Face Rosacea is a chronic progressive skin condition usually affecting the face of adults, causing redness and/or acne bumps. It is treatable but not curable. It sometimes affects the eyes (ocular rosacea) as well. It may respond to topical and/or systemic medication and can flare with stress, sun exposure, alcohol, exercise, topical steroids (including hydrocortisone/cortisone 10) and some foods.  Daily application of broad spectrum spf 30+ sunscreen to face is recommended to reduce flares.  Counseling for BBL / IPL / Laser and Coordination of Care Discussed the treatment option of Broad Band Light (BBL) Intense Pulsed Light (IPL) / Laser.  Typically we recommend at least 1-3  treatment sessions about 5-8 weeks apart for best results.  The patient's condition may require "maintenance treatments" in the future.  The fee for BBL / laser treatments is $350 per treatment session for the whole face.  A fee can be quoted for other parts of the body. Insurance typically does not pay for BBL/laser treatments and therefore the fee is an out-of-pocket cost.  Start [facility] mix BID.   Azelaic Acid 15 % gel - Face Apply to face QHS.  Neoplasm of uncertain behavior of skin (2) - RASH GA vs hypersensitivity reaction L back sup  Skin / nail biopsy Type of biopsy: punch   Informed consent: discussed and consent obtained   Timeout: patient name, date of birth, surgical site, and procedure verified   Procedure prep:  Patient was prepped and draped in usual sterile fashion (the patient was cleaned and prepped) Prep type:  Isopropyl alcohol Anesthesia: the lesion was anesthetized in a standard fashion   Anesthetic:  1% lidocaine w/ epinephrine 1-100,000 buffered w/ 8.4% NaHCO3 Punch size:  3 mm Suture size:  4-0 Suture type: nylon   Suture removal (days):  7 Hemostasis achieved with: suture, pressure and aluminum chloride   Outcome: patient tolerated procedure well   Post-procedure details: sterile dressing applied and wound care instructions given   Dressing type: bandage, petrolatum and pressure dressing    Specimen 1 - Surgical pathology Differential Diagnosis: D48.5 r/o granuloma annulare vs other Sup in center of lesion Check Margins: No  L back inf  Skin / nail biopsy Type of biopsy: punch   Informed consent: discussed and consent obtained   Timeout: patient name, date of birth, surgical site, and procedure verified   Procedure prep:  Patient was prepped and draped in usual sterile fashion (the patient was cleaned and prepped) Prep type:  Isopropyl alcohol Anesthesia: the lesion was anesthetized in a standard fashion   Anesthetic:  1% lidocaine w/  epinephrine 1-100,000 buffered w/ 8.4% NaHCO3 Punch size:  3 mm Suture size:  4-0 Suture type: nylon   Suture removal (days):  7 Hemostasis achieved with: suture, pressure and aluminum chloride   Outcome: patient tolerated procedure well   Post-procedure details: sterile dressing applied and wound care instructions given   Dressing type: bandage, petrolatum and pressure dressing    Specimen 2 - Surgical pathology Differential Diagnosis: D48.5 r/o granuloma annulare vs other Inf located at pink border Check Margins: No  Atopic dermatitis, Face Atopic dermatitis (eczema) is a chronic, relapsing, pruritic condition that can significantly affect quality of life. It is often associated with allergic rhinitis and/or asthma and can require treatment with topical medications, phototherapy, or in severe cases biologic injectable medication (Dupixent; Adbry) or Oral JAK inhibitors.  Start Opzelura to aa's face QAM Ok to use CeraVe moisturizer after applying medication. Samples given of Opzelura cream.   Ruxolitinib Phosphate (OPZELURA) 1.5 % CREA - Face Apply to aa's eczema QAM.  AK (actinic keratosis) Face Vs ISKs vs atopic dermatitis of the face -  Plan to recheck and treat if needed once inflammation of the calmed down.   Actinic Damage - chronic, secondary to cumulative UV radiation exposure/sun exposure over time - diffuse scaly erythematous macules with underlying dyspigmentation - Recommend daily broad spectrum sunscreen SPF 30+ to sun-exposed areas, reapply every 2 hours as needed.  - Recommend staying in the shade or wearing long sleeves, sun glasses (UVA+UVB protection) and wide brim hats (4-inch brim around the entire circumference of the hat). - Call for new or changing lesions.  Seborrheic Keratoses - Stuck-on, waxy, tan-brown papules and/or plaques  - Benign-appearing - Discussed benign etiology and prognosis. - Observe - Call for any changes  Lentigines - Scattered  tan macules - Due to sun exposure - Benign-appearing, observe - Recommend daily broad spectrum sunscreen SPF 30+ to sun-exposed areas, reapply every 2 hours as needed. - Call for any changes  Return in about 1 week (around 05/25/2022) for follow up and suture removal.  I, Rudell Cobb, CMA, am acting as scribe for Sarina Ser, MD . Documentation: I have reviewed the above documentation for accuracy and completeness, and I agree with the above.  Sarina Ser, MD

## 2022-05-22 ENCOUNTER — Ambulatory Visit
Admission: RE | Admit: 2022-05-22 | Discharge: 2022-05-22 | Disposition: A | Discharge status: Home / Self Care | Payer: Medicare Other | Source: Ambulatory Visit | Attending: Family Medicine | Admitting: Family Medicine

## 2022-05-22 DIAGNOSIS — E041 Nontoxic single thyroid nodule: Secondary | ICD-10-CM | POA: Insufficient documentation

## 2022-05-25 ENCOUNTER — Ambulatory Visit (INDEPENDENT_AMBULATORY_CARE_PROVIDER_SITE_OTHER): Payer: Medicare Other | Admitting: Dermatology

## 2022-05-25 ENCOUNTER — Encounter: Payer: Self-pay | Admitting: Dermatology

## 2022-05-25 VITALS — BP 128/64 | HR 75

## 2022-05-25 DIAGNOSIS — Z7189 Other specified counseling: Secondary | ICD-10-CM

## 2022-05-25 DIAGNOSIS — Z79899 Other long term (current) drug therapy: Secondary | ICD-10-CM | POA: Diagnosis not present

## 2022-05-25 DIAGNOSIS — R21 Rash and other nonspecific skin eruption: Secondary | ICD-10-CM | POA: Diagnosis not present

## 2022-05-25 MED ORDER — DOXYCYCLINE HYCLATE 100 MG PO CAPS: 100.0000 mg | ORAL_CAPSULE | Freq: Two times a day (BID) | ORAL | 0 refills | Status: AC

## 2022-05-25 NOTE — Progress Notes (Unsigned)
   Follow-Up Visit   Subjective  Stacey Munoz is a 77 y.o. female who presents for the following: Rash (Recheck back. Suture removal. Discuss pathology results.). The following portions of the chart were reviewed this encounter and updated as appropriate:  Tobacco  Allergies  Meds  Problems  Med Hx  Surg Hx  Fam Hx     Review of Systems: No other skin or systemic complaints except as noted in HPI or Assessment and Plan.  Objective  Well appearing patient in no apparent distress; mood and affect are within normal limits.  A focused examination was performed including face, back. Relevant physical exam findings are noted in the Assessment and Plan.  Left Upper Back Mild erythema   Assessment & Plan   Encounter for Removal of Sutures - Biopsy sites at the left back is clean, dry and intact - Wound cleansed, sutures removed, wound cleansed and steri strips applied.  - Discussed pathology results showing dermal hypersensitivity reaction.  - Scars remodel for a full year. - Ccan apply over-the-counter silicone scar cream each night to help with scar remodeling if desired. - Patient advised to call with any concerns or if they notice any new or changing lesions.   Rash Left Upper Back Dermal hypersensitivity reaction vs Lyme disease vs other Advised patient of the nonspecific nature of her pathology.  This can represent allergy;  bite reaction;  medication reaction or other things. Chronic and persistent condition with duration or expected duration over one year. Condition is symptomatic / bothersome to patient. Not to goal.  Recommend seeing allergist.  Start Doxycycline 100 mg twice daily with food for 1 month. #30, 0Rf for treatment of possibility of Lyme disease.  Discussed we can do Lyme Titers, but would not assess for other similar tick borne diseases and can just treat.  Doxycycline should be taken with food to prevent nausea. Do not lay down for 30 minutes after  taking. Be cautious with sun exposure and use good sun protection while on this medication. Pregnant women should not take this medication.   doxycycline (VIBRAMYCIN) 100 MG capsule - Left Upper Back Take 1 capsule (100 mg total) by mouth 2 (two) times daily. Take with food  Related Procedures Ambulatory referral to Allergy  Return in about 3 months (around 08/24/2022) for Rosacea Follow Up.  I, Emelia Salisbury, CMA, am acting as scribe for Sarina Ser, MD. Documentation: I have reviewed the above documentation for accuracy and completeness, and I agree with the above.  Sarina Ser, MD

## 2022-05-25 NOTE — Patient Instructions (Signed)
Start Doxycycline 100 mg twice daily with food for 1 month. #30, 0Rf  Doxycycline should be taken with food to prevent nausea. Do not lay down for 30 minutes after taking. Be cautious with sun exposure and use good sun protection while on this medication. Pregnant women should not take this medication.    Due to recent changes in healthcare laws, you may see results of your pathology and/or laboratory studies on MyChart before the doctors have had a chance to review them. We understand that in some cases there may be results that are confusing or concerning to you. Please understand that not all results are received at the same time and often the doctors may need to interpret multiple results in order to provide you with the best plan of care or course of treatment. Therefore, we ask that you please give [place] 2 business days to thoroughly review all your results before contacting the office for clarification. Should we see a critical lab result, you will be contacted sooner.   If You Need Anything After Your Visit  If you have any questions or concerns for your doctor, please call our main line at [phone] and press option 4 to reach your doctor's medical assistant. If no one answers, please leave a voicemail as directed and we will return your call as soon as possible. Messages left after 4 pm will be answered the following business day.   You may also send [place] a message via [place]. We typically respond to MyChart messages within 1-2 business days.  For prescription refills, please ask your pharmacy to contact our office. Our fax number is [phone].  If you have an urgent issue when the clinic is closed that cannot wait until the next business day, you can page your doctor at the number below.    Please note that while we do our best to be available for urgent issues outside of office hours, we are not available 24/7.   If you have an urgent issue and are unable to reach [place], you may choose  to seek medical care at your doctor's office, retail clinic, urgent care center, or emergency room.  If you have a medical emergency, please immediately call 911 or go to the emergency department.  Pager Numbers  - Dr. Nehemiah Massed: [phone]  - Dr. Laurence Ferrari: [phone]  - Dr. Nicole Kindred: [phone]  In the event of inclement weather, please call our main line at [phone] for an update on the status of any delays or closures.  Dermatology Medication Tips: Please keep the boxes that topical medications come in in order to help keep track of the instructions about where and how to use these. Pharmacies typically print the medication instructions only on the boxes and not directly on the medication tubes.   If your medication is too expensive, please contact our office at [phone] option 4 or send [place] a message through [place].   We are unable to tell what your co-pay for medications will be in advance as this is different depending on your insurance coverage. However, we may be able to find a substitute medication at lower cost or fill out paperwork to get insurance to cover a needed medication.   If a prior authorization is required to get your medication covered by your insurance company, please allow [place] 1-2 business days to complete this process.  Drug prices often vary depending on where the prescription is filled and some pharmacies may offer cheaper prices.  The website www.goodrx.com contains  coupons for medications through different pharmacies. The prices here do not account for what the cost may be with help from insurance (it may be cheaper with your insurance), but the website can give you the price if you did not use any insurance.  - You can print the associated coupon and take it with your prescription to the pharmacy.  - You may also stop by our office during regular business hours and pick up a GoodRx coupon card.  - If you need your prescription sent electronically to a  different pharmacy, notify our office through [facility] or by phone at [phone] option 4.     Si Usted Necesita Algo Despus de Su Visita  Tambin puede enviarnos un mensaje a travs de Pharmacist, community. Por lo general respondemos a los mensajes de MyChart en el transcurso de 1 a 2 das hbiles.  Para renovar recetas, por favor pida a su farmacia que se ponga en contacto con nuestra oficina. Harland Dingwall de fax es [place] [phone].  Si tiene un asunto urgente cuando la clnica est cerrada y que no puede esperar hasta el siguiente da hbil, puede llamar/localizar a su doctor(a) al nmero que aparece a continuacin.   Por favor, tenga en cuenta que aunque hacemos todo lo posible para estar disponibles para asuntos urgentes fuera del horario de [place], no estamos disponibles las 24 horas del da, los 7 das de la [place].   Si tiene un problema urgente y no puede comunicarse con nosotros, puede optar por buscar atencin mdica  en el consultorio de su doctor(a), en una clnica privada, en un centro de atencin urgente o en una sala de emergencias.  Si tiene Engineering geologist, por favor llame inmediatamente al 911 o vaya a la sala de emergencias.  Nmeros de bper  - Dr. Nehemiah Massed: [phone]  - Dra. Moye: [phone]  - Dra. Nicole Kindred: [phone]  En caso de inclemencias del [place], por favor llame a Johnsie Kindred principal al [phone] para una actualizacin sobre el [place] de cualquier retraso o cierre.  Consejos para la medicacin en dermatologa: Por favor, guarde las cajas en las que vienen los medicamentos de uso tpico para ayudarle a seguir las instrucciones sobre dnde y cmo usarlos. Las farmacias generalmente imprimen las instrucciones del medicamento slo en las cajas y no directamente en los tubos del [place].   Si su medicamento es muy caro, por favor, pngase en contacto con Zigmund Daniel llamando al [phone] y presione la opcin 4 o envenos un  mensaje a travs de Pharmacist, community.   No podemos decirle cul ser su copago por los medicamentos por adelantado ya que esto es diferente dependiendo de la cobertura de su seguro. Sin embargo, es posible que podamos encontrar un medicamento sustituto a Electrical engineer un formulario para que el seguro cubra el medicamento que se considera necesario.   Si se requiere una autorizacin previa para que su compaa de seguros [place] su medicamento, por favor permtanos de 1 a 2 das hbiles para completar este proceso.  Los precios de los medicamentos varan con frecuencia dependiendo del Environmental consultant de dnde se surte la receta y alguna farmacias pueden ofrecer precios ms baratos.  El sitio web www.goodrx.com tiene cupones para medicamentos de Airline pilot. Los precios aqu no tienen en cuenta lo que podra costar con la ayuda del seguro (puede ser ms barato con su seguro), pero el sitio web puede darle el precio si no utiliz Research scientist (physical sciences).  - Puede imprimir el cupn  correspondiente y llevarlo con su receta a la farmacia.  - Tambin puede pasar por nuestra oficina durante el horario de atencin regular y Charity fundraiser una tarjeta de cupones de GoodRx.  - Si necesita que su receta se enve electrnicamente a una farmacia diferente, informe a nuestra oficina a travs de MyChart de [place] o por telfono llamando al [phone] y presione la opcin 4.

## 2022-05-26 ENCOUNTER — Encounter: Payer: Medicare HMO | Admitting: Internal Medicine

## 2022-05-26 ENCOUNTER — Ambulatory Visit: Payer: Medicare HMO | Admitting: Family Medicine

## 2022-05-27 ENCOUNTER — Encounter: Payer: Self-pay | Admitting: Dermatology

## 2022-05-28 ENCOUNTER — Encounter: Payer: Medicare HMO | Admitting: Family Medicine

## 2022-05-28 DIAGNOSIS — M5136 Other intervertebral disc degeneration, lumbar region: Secondary | ICD-10-CM | POA: Diagnosis not present

## 2022-05-28 DIAGNOSIS — M62830 Muscle spasm of back: Secondary | ICD-10-CM | POA: Diagnosis not present

## 2022-05-28 DIAGNOSIS — M9903 Segmental and somatic dysfunction of lumbar region: Secondary | ICD-10-CM | POA: Diagnosis not present

## 2022-05-28 DIAGNOSIS — M5416 Radiculopathy, lumbar region: Secondary | ICD-10-CM | POA: Diagnosis not present

## 2022-05-29 ENCOUNTER — Encounter: Payer: Self-pay | Admitting: Dermatology

## 2022-06-04 DIAGNOSIS — R21 Rash and other nonspecific skin eruption: Secondary | ICD-10-CM | POA: Diagnosis not present

## 2022-06-04 DIAGNOSIS — J3089 Other allergic rhinitis: Secondary | ICD-10-CM | POA: Diagnosis not present

## 2022-06-04 DIAGNOSIS — H903 Sensorineural hearing loss, bilateral: Secondary | ICD-10-CM | POA: Diagnosis not present

## 2022-06-17 DIAGNOSIS — Z961 Presence of intraocular lens: Secondary | ICD-10-CM | POA: Diagnosis not present

## 2022-06-17 DIAGNOSIS — H43813 Vitreous degeneration, bilateral: Secondary | ICD-10-CM | POA: Diagnosis not present

## 2022-06-17 DIAGNOSIS — M3501 Sicca syndrome with keratoconjunctivitis: Secondary | ICD-10-CM | POA: Diagnosis not present

## 2022-06-17 DIAGNOSIS — H47323 Drusen of optic disc, bilateral: Secondary | ICD-10-CM | POA: Diagnosis not present

## 2022-06-24 DIAGNOSIS — M5416 Radiculopathy, lumbar region: Secondary | ICD-10-CM | POA: Diagnosis not present

## 2022-06-24 DIAGNOSIS — M9903 Segmental and somatic dysfunction of lumbar region: Secondary | ICD-10-CM | POA: Diagnosis not present

## 2022-06-24 DIAGNOSIS — M5136 Other intervertebral disc degeneration, lumbar region: Secondary | ICD-10-CM | POA: Diagnosis not present

## 2022-06-24 DIAGNOSIS — M62830 Muscle spasm of back: Secondary | ICD-10-CM | POA: Diagnosis not present

## 2022-06-25 ENCOUNTER — Other Ambulatory Visit (INDEPENDENT_AMBULATORY_CARE_PROVIDER_SITE_OTHER): Payer: Medicare Other

## 2022-06-25 DIAGNOSIS — E782 Mixed hyperlipidemia: Secondary | ICD-10-CM

## 2022-06-25 LAB — HEPATIC FUNCTION PANEL
ALT: 19 U/L (ref 0–35)
AST: 18 U/L (ref 0–37)
Albumin: 4.2 g/dL (ref 3.5–5.2)
Alkaline Phosphatase: 69 U/L (ref 39–117)
Bilirubin, Direct: 0.2 mg/dL (ref 0.0–0.3)
Total Bilirubin: 0.8 mg/dL (ref 0.2–1.2)
Total Protein: 6.4 g/dL (ref 6.0–8.3)

## 2022-06-25 LAB — LDL CHOLESTEROL, DIRECT: Direct LDL: 88 mg/dL

## 2022-06-30 ENCOUNTER — Encounter: Payer: Self-pay | Admitting: Family Medicine

## 2022-06-30 DIAGNOSIS — E782 Mixed hyperlipidemia: Secondary | ICD-10-CM

## 2022-07-01 NOTE — Telephone Encounter (Signed)
Pt called in to make an lab appt in 6 weeks. She's scheduled for 4/18 '@7'$ :45am.

## 2022-07-02 ENCOUNTER — Telehealth: Payer: Self-pay

## 2022-07-02 MED ORDER — DOXYCYCLINE HYCLATE 100 MG PO TABS: 100.0000 mg | ORAL_TABLET | Freq: Every day | ORAL | 0 refills | Status: DC

## 2022-07-02 NOTE — Telephone Encounter (Signed)
Patient called the office due to finishing antibiotic and her rash is starting to break out again across the back and asking to be seen by you ASAP. The rash is more red and itchy since being off Doxycycline. She also states her toes are now turning red and starting to itch.  She was seen by Dr. Donneta Romberg and tested for over 70 things. They did not test her soy and only milk and egg, instead of a full dairy panel.

## 2022-07-02 NOTE — Telephone Encounter (Signed)
Patient bringing records in for Dr. Nehemiah Massed to review. She would only like to try Doxycycline one more month. Patient advised if rash continues to get worse to call our office and we will try to work her in. aw

## 2022-07-06 ENCOUNTER — Encounter: Payer: Self-pay | Admitting: Dermatology

## 2022-07-06 ENCOUNTER — Ambulatory Visit (INDEPENDENT_AMBULATORY_CARE_PROVIDER_SITE_OTHER): Payer: Medicare Other | Admitting: Dermatology

## 2022-07-06 VITALS — BP 101/59 | HR 79

## 2022-07-06 DIAGNOSIS — L539 Erythematous condition, unspecified: Secondary | ICD-10-CM | POA: Diagnosis not present

## 2022-07-06 DIAGNOSIS — R21 Rash and other nonspecific skin eruption: Secondary | ICD-10-CM | POA: Diagnosis not present

## 2022-07-06 DIAGNOSIS — Z7189 Other specified counseling: Secondary | ICD-10-CM

## 2022-07-06 DIAGNOSIS — D229 Melanocytic nevi, unspecified: Secondary | ICD-10-CM

## 2022-07-06 NOTE — Patient Instructions (Addendum)
Recommend taking aspirin 81 mg tab take 1 by mouth daily for 1 month to see if makes a difference in pinkness at toes    Due to recent changes in healthcare laws, you may see results of your pathology and/or laboratory studies on MyChart before the doctors have had a chance to review them. We understand that in some cases there may be results that are confusing or concerning to you. Please understand that not all results are received at the same time and often the doctors may need to interpret multiple results in order to provide you with the best plan of care or course of treatment. Therefore, we ask that you please give [place] 2 business days to thoroughly review all your results before contacting the office for clarification. Should we see a critical lab result, you will be contacted sooner.   If You Need Anything After Your Visit  If you have any questions or concerns for your doctor, please call our main line at [phone] and press option 4 to reach your doctor's medical assistant. If no one answers, please leave a voicemail as directed and we will return your call as soon as possible. Messages left after 4 pm will be answered the following business day.   You may also send [place] a message via [place]. We typically respond to MyChart messages within 1-2 business days.  For prescription refills, please ask your pharmacy to contact our office. Our fax number is [phone].  If you have an urgent issue when the clinic is closed that cannot wait until the next business day, you can page your doctor at the number below.    Please note that while we do our best to be available for urgent issues outside of office hours, we are not available 24/7.   If you have an urgent issue and are unable to reach [place], you may choose to seek medical care at your doctor's office, retail clinic, urgent care center, or emergency room.  If you have a medical emergency, please immediately call 911 or go to the emergency  department.  Pager Numbers  - Dr. Nehemiah Massed: [phone]  - Dr. Laurence Ferrari: [phone]  - Dr. Nicole Kindred: [phone]  In the event of inclement weather, please call our main line at [phone] for an update on the status of any delays or closures.  Dermatology Medication Tips: Please keep the boxes that topical medications come in in order to help keep track of the instructions about where and how to use these. Pharmacies typically print the medication instructions only on the boxes and not directly on the medication tubes.   If your medication is too expensive, please contact our office at [phone] option 4 or send [place] a message through [place].   We are unable to tell what your co-pay for medications will be in advance as this is different depending on your insurance coverage. However, we may be able to find a substitute medication at lower cost or fill out paperwork to get insurance to cover a needed medication.   If a prior authorization is required to get your medication covered by your insurance company, please allow [place] 1-2 business days to complete this process.  Drug prices often vary depending on where the prescription is filled and some pharmacies may offer cheaper prices.  The website www.goodrx.com contains coupons for medications through different pharmacies. The prices here do not account for what the cost may be with help from insurance (it may be cheaper with your insurance),  but the website can give you the price if you did not use any insurance.  - You can print the associated coupon and take it with your prescription to the pharmacy.  - You may also stop by our office during regular business hours and pick up a GoodRx coupon card.  - If you need your prescription sent electronically to a different pharmacy, notify our office through [facility] or by phone at [phone] option 4.     Si Usted Necesita Algo Despus de Su Visita  Tambin puede enviarnos un  mensaje a travs de Pharmacist, community. Por lo general respondemos a los mensajes de MyChart en el transcurso de 1 a 2 das hbiles.  Para renovar recetas, por favor pida a su farmacia que se ponga en contacto con nuestra oficina. Harland Dingwall de fax es [place] [phone].  Si tiene un asunto urgente cuando la clnica est cerrada y que no puede esperar hasta el siguiente da hbil, puede llamar/localizar a su doctor(a) al nmero que aparece a continuacin.   Por favor, tenga en cuenta que aunque hacemos todo lo posible para estar disponibles para asuntos urgentes fuera del horario de [place], no estamos disponibles las 24 horas del da, los 7 das de la [place].   Si tiene un problema urgente y no puede comunicarse con nosotros, puede optar por buscar atencin mdica  en el consultorio de su doctor(a), en una clnica privada, en un centro de atencin urgente o en una sala de emergencias.  Si tiene Engineering geologist, por favor llame inmediatamente al 911 o vaya a la sala de emergencias.  Nmeros de bper  - Dr. Nehemiah Massed: [phone]  - Dra. Moye: [phone]  - Dra. Nicole Kindred: [phone]  En caso de inclemencias del [place], por favor llame a Johnsie Kindred principal al [phone] para una actualizacin sobre el [place] de cualquier retraso o cierre.  Consejos para la medicacin en dermatologa: Por favor, guarde las cajas en las que vienen los medicamentos de uso tpico para ayudarle a seguir las instrucciones sobre dnde y cmo usarlos. Las farmacias generalmente imprimen las instrucciones del medicamento slo en las cajas y no directamente en los tubos del [place].   Si su medicamento es muy caro, por favor, pngase en contacto con Zigmund Daniel llamando al [phone] y presione la opcin 4 o envenos un mensaje a travs de Pharmacist, community.   No podemos decirle cul ser su copago por los medicamentos por adelantado ya que esto es diferente dependiendo de la cobertura de su seguro. Sin embargo,  es posible que podamos encontrar un medicamento sustituto a Electrical engineer un formulario para que el seguro cubra el medicamento que se considera necesario.   Si se requiere una autorizacin previa para que su compaa de seguros [place] su medicamento, por favor permtanos de 1 a 2 das hbiles para completar este proceso.  Los precios de los medicamentos varan con frecuencia dependiendo del Environmental consultant de dnde se surte la receta y alguna farmacias pueden ofrecer precios ms baratos.  El sitio web www.goodrx.com tiene cupones para medicamentos de Airline pilot. Los precios aqu no tienen en cuenta lo que podra costar con la ayuda del seguro (puede ser ms barato con su seguro), pero el sitio web puede darle el precio si no utiliz Research scientist (physical sciences).  - Puede imprimir el cupn correspondiente y llevarlo con su receta a la farmacia.  - Tambin puede pasar por nuestra oficina durante el horario de atencin regular y Charity fundraiser una tarjeta de cupones  de GoodRx.  - Si necesita que su receta se enve electrnicamente a una farmacia diferente, informe a nuestra oficina a travs de MyChart de [place] o por telfono llamando al [phone] y presione la opcin 4.

## 2022-07-06 NOTE — Progress Notes (Unsigned)
   Follow-Up Visit   Subjective  Stacey Munoz is a 77 y.o. female who presents for the following: Rash (Patient is here today concerning rash like bumps at lower back. Reports feels may be insect bites. Was prescribed rx of doxycycline 100 mg and has been taking 1 pill twice daily. Also reports redness in toes. ). The patient has spots, moles and lesions to be evaluated, some may be new or changing and the patient has concerns that these could be cancer.  The following portions of the chart were reviewed this encounter and updated as appropriate:  Tobacco  Allergies  Meds  Problems  Med Hx  Surg Hx  Fam Hx     Review of Systems: No other skin or systemic complaints except as noted in HPI or Assessment and Plan.  Objective  Well appearing patient in no apparent distress; mood and affect are within normal limits.  A focused examination was performed including b/l toes, back. Relevant physical exam findings are noted in the Assessment and Plan.  lower back Clear at exam  b/l toes Slightly pink toes b/l    Assessment & Plan  1- Rash and other nonspecific skin eruption lower back; b/l toes Favor bite reaction of back today - 2 resolving spots.  I do not feel any treatment warranted. I do not feel this is same condition #2 biopsied below. If recurs or multiple lesions occur in future, return for re-evaluation.  2- Hx of Dermal hypersensitivity reaction vs Lyme disease vs other Advised patient of the nonspecific nature of her pathology.  This can represent allergy;  bite reaction;  medication reaction or other things. Pt took doxycyline 100 mg 1 po bid for 30 days.  It now seems resolved.  Patient has been seen by allergist. See test results.  Not significant. Clear at exam today, do not recommend treatment at this time.   3- Mild pinkness at toes Likely just vasoactive changes. No recommendations for treatment. Discussed Ddx : erythromelalgia vs raynaud's phenomenon - not  highly likely  Recommend trying Asa 81 mg tab - 1 po qd for 1 month to see if helpful.  May need further evaluation by rheumatologist if pt feels this is problematic or progresses.  4- Melanocytic Nevi - Tan-brown and/or pink-flesh-colored symmetric macules and papules - Benign appearing on exam today - Observation - Call clinic for new or changing moles - Recommend daily use of broad spectrum spf 30+ sunscreen to sun-exposed areas.   Return if symptoms worsen or fail to improve.  IRuthell Rummage, CMA, am acting as scribe for Sarina Ser, MD. Documentation: I have reviewed the above documentation for accuracy and completeness, and I agree with the above.  Sarina Ser, MD

## 2022-07-08 ENCOUNTER — Encounter: Payer: Self-pay | Admitting: Dermatology

## 2022-07-15 ENCOUNTER — Telehealth: Payer: Self-pay | Admitting: Family Medicine

## 2022-07-15 NOTE — Telephone Encounter (Signed)
Contacted Stacey Munoz to schedule their annual wellness visit. Appointment made for 07/27/2022.  Thank you,  [place] Direct dial  [phone]

## 2022-07-21 DIAGNOSIS — M62830 Muscle spasm of back: Secondary | ICD-10-CM | POA: Diagnosis not present

## 2022-07-21 DIAGNOSIS — M9903 Segmental and somatic dysfunction of lumbar region: Secondary | ICD-10-CM | POA: Diagnosis not present

## 2022-07-21 DIAGNOSIS — M5136 Other intervertebral disc degeneration, lumbar region: Secondary | ICD-10-CM | POA: Diagnosis not present

## 2022-07-21 DIAGNOSIS — M5416 Radiculopathy, lumbar region: Secondary | ICD-10-CM | POA: Diagnosis not present

## 2022-07-27 ENCOUNTER — Ambulatory Visit (INDEPENDENT_AMBULATORY_CARE_PROVIDER_SITE_OTHER): Payer: Medicare Other

## 2022-07-27 VITALS — Ht 64.0 in | Wt 133.0 lb

## 2022-07-27 DIAGNOSIS — Z0000 Encounter for general adult medical examination without abnormal findings: Secondary | ICD-10-CM | POA: Diagnosis not present

## 2022-07-27 NOTE — Progress Notes (Signed)
Subjective:   Stacey Munoz is a 77 y.o. female who presents for Medicare Annual (Subsequent) preventive examination.  Review of Systems    No ROS.  Medicare Wellness Virtual Visit.  Visual/audio telehealth visit, UTA vital signs.   See social history for additional risk factors.   Cardiac Risk Factors include: advanced age (>109men, >36 women)     Objective:    Today's Vitals   07/27/22 0837  Weight: 133 lb (60.3 kg)  Height: 5\' 4"  (1.626 m)   Body mass index is 22.83 kg/m.     07/27/2022    8:49 AM 09/30/2021   10:35 AM 09/16/2021    7:09 AM 05/20/2021   10:58 AM 05/19/2018   10:12 AM 05/18/2017    8:57 AM 11/05/2016    8:40 AM  Advanced Directives  Does Patient Have a Medical Advance Directive? Yes Yes Yes Yes Yes Yes No  Type of Paramedic of [place];Living will [place];Living will [place];Living will  [place];Living will [place];Living will   Does patient want to make changes to medical advance directive? No - Patient declined No - Patient declined No - Patient declined      Copy of [place] in Chart? Yes - validated most recent copy scanned in chart (See row information) Yes - validated most recent copy scanned in chart (See row information) Yes - validated most recent copy scanned in chart (See row information)  No - copy requested No - copy requested     Current Medications (verified) Outpatient Encounter Medications as of 07/27/2022  Medication Sig   Azelaic Acid 15 % gel Apply to face QHS.   CALCIUM PO Take 1,000 mg by mouth daily at 12 noon.   Cholecalciferol (VITAMIN D3 PO) Take 20 mcg by mouth daily at 12 noon.   Flaxseed Oil OIL Take 30 mLs by mouth daily at 12 noon. 2 tablespoons = 30 mL   Misc Natural Products (ELDERBERRY ZINC/VIT C/IMMUNE MT) Use as directed in the mouth or throat daily at 12 noon.   Multiple Vitamins-Minerals (CENTRUM  SILVER PO) Take by mouth daily at 12 noon.   rosuvastatin (CRESTOR) 10 MG tablet Take 1 tablet (10 mg total) by mouth daily.   Ruxolitinib Phosphate (OPZELURA) 1.5 % CREA Apply to aa's eczema QAM.   [DISCONTINUED] doxycycline (VIBRA-TABS) 100 MG tablet Take 1 tablet (100 mg total) by mouth daily.   No facility-administered encounter medications on file as of 07/27/2022.    Allergies (verified) Patient has no known allergies.   History: Past Medical History:  Diagnosis Date   COVID-19    11/08/20   Hyperlipidemia    Low serum vitamin D    Osteoporosis    Past Surgical History:  Procedure Laterality Date   CATARACT EXTRACTION W/PHACO Right 09/16/2021   Procedure: CATARACT EXTRACTION PHACO AND INTRAOCULAR LENS PLACEMENT (IOC) RIGHT 3.80 00:36.0;  Surgeon: Birder Robson, MD;  Location: [facility];  Service: Ophthalmology;  Laterality: Right;  needs to have an AM appointment   CATARACT EXTRACTION W/PHACO Left 09/30/2021   Procedure: CATARACT EXTRACTION PHACO AND INTRAOCULAR LENS PLACEMENT (IOC) LEFT 5.17 00:33.9;  Surgeon: Birder Robson, MD;  Location: [facility];  Service: Ophthalmology;  Laterality: Left;   COLONOSCOPY WITH PROPOFOL N/A 05/20/2021   Procedure: COLONOSCOPY WITH PROPOFOL;  Surgeon: Lucilla Lame, MD;  Location[facility]COPY;  Service: Endoscopy;  Laterality: N/A;   FACIAL COSMETIC SURGERY  FRACTURE SURGERY     KNEE ARTHROSCOPY Right 01/2011   torn meniscus   ROOT CANAL Left    04/2021 surgery schedule   TRIGGER FINGER RELEASE Right 08/2014   TUBAL LIGATION     WRIST FRACTURE SURGERY     left   Family History  Problem Relation Age of Onset   Cancer Mother        breast cancer   Breast cancer Mother 51   Hypertension Mother    Hyperlipidemia Mother    Diabetes Father    Cancer Father        lymphoma    Heart disease Brother    Clotting disorder Brother        on Coumadin    Kidney disease Maternal Grandmother        died young age  when her mother was 68 y.o    Cancer Maternal Grandfather    Heart attack Maternal Grandfather    Cancer Paternal Grandmother        ?breast cancer   Breast cancer Paternal Grandmother        PT think it was cancer; never got check    Other Paternal Grandfather        polio   CAD Son        x 2   Gout Son    CAD Son        cad age 25 y.o   Social History   Socioeconomic History   Marital status: Single    Spouse name: Not on file   Number of children: 3   Years of education: Not on file   Highest education level: Doctorate  Occupational History    Employer: RETIRED  Tobacco Use   Smoking status: Never   Smokeless tobacco: Never   Tobacco comments:    smoking cessation materials not required  Vaping Use   Vaping Use: Never used  Substance and Sexual Activity   Alcohol use: Yes    Alcohol/week: 1.0 standard drink of alcohol    Types: 1 Glasses of wine per week    Comment: rare   Drug use: No   Sexual activity: Not Currently  Other Topics Concern   Not on file  Social History Narrative   [employer] used to work    3 sons    DPR son Jeanmarie Hubert [phone]   7 grandchildren 1 boy and 6 girls    Used to horse back ride    Likes exercise silver sneakers, massage 1 x per month, yoga walks 2-3 miles daily and does weight lifting    Never smoker    Social Determinants of Health   Financial Resource Strain: Low Risk  (07/23/2022)   Overall Financial Resource Strain (CARDIA)    Difficulty of Paying Living Expenses: Not hard at all  Food Insecurity: No Food Insecurity (07/23/2022)   Hunger Vital Sign    Worried About Running Out of Food in the Last Year: Never true    [place] in the Last Year: Never true  Transportation Needs: No Transportation Needs (07/23/2022)   PRAPARE - Hydrologist (Medical): No    Lack of Transportation (Non-Medical): No  Physical Activity: Sufficiently Active (07/23/2022)   Exercise Vital Sign    Days  of Exercise per Week: 7 days    Minutes of Exercise per Session: 60 min  Stress: No Stress Concern Present (07/23/2022)   [place] -  Occupational Stress Questionnaire    Feeling of Stress : Only a little  Social Connections: Unknown (07/23/2022)   Social Connection and Isolation Panel [NHANES]    Frequency of Communication with Friends and Family: Three times a week    Frequency of Social Gatherings with Friends and Family: Three times a week    Attends Religious Services: Not on file    Active Member of Clubs or Organizations: Yes    Attends Archivist Meetings: More than 4 times per year    Marital Status: Divorced    Tobacco Counseling Counseling given: Not Answered Tobacco comments: smoking cessation materials not required   Clinical Intake:  Pre-visit preparation completed: Yes        Diabetes: No  How often do you need to have someone help you when you read instructions, pamphlets, or other written materials from your doctor or pharmacy?: 1 - Never    Interpreter Needed?: No      Activities of Daily Living    07/23/2022    9:05 PM 09/30/2021   10:37 AM  In your present state of health, do you have any difficulty performing the following activities:  Hearing? 0 0  Vision? 0 0  Difficulty concentrating or making decisions? 0 0  Walking or climbing stairs? 0   Dressing or bathing? 0 0  Doing errands, shopping? 0   Preparing Food and eating ? N   Using the Toilet? N   In the past six months, have you accidently leaked urine? N   Do you have problems with loss of bowel control? N   Managing your Medications? N   Managing your Finances? N   Housekeeping or managing your Housekeeping? N     Patient Care Team: Leone Haven, MD as PCP - General (Family Medicine) Birder Robson, MD as Referring Physician (Ophthalmology)  Indicate any recent Medical Services you may have received from other than [facility] providers in  the past year (date may be approximate).     Assessment:   This is a routine wellness examination for Xia.  Patient Medicare AWV questionnaire was completed by the patient on 07/23/22, I have confirmed that all information answered by patient is correct and no changes since this date.   I connected with  Billie Lade on 07/27/22 by a audio enabled telemedicine application and verified that I am speaking with the correct person using two identifiers.  Patient Location: Home  Provider Location: Office/Clinic  I discussed the limitations of evaluation and management by telemedicine. The patient expressed understanding and agreed to proceed.     Hearing/Vision screen Hearing Screening - Comments:: Pt wears hearing aids maintained by [facility] Vision Screening - Comments:: Vision screenings done by Dr. Michelene Heady  Dietary issues and exercise activities discussed: Current Exercise Habits: Home exercise routine, Type of exercise: walking;yoga;stretching;calisthenics (dancing, post physical therapy), Frequency (Times/Week): 7, Intensity: Moderate   Goals Addressed               This Visit's Progress     Patient Stated     Weight (lb) < 133 lb (60.3 kg) (pt-stated)   133 lb (60.3 kg)     Weight goal 125lb Healthy diet Stay active       Depression Screen    07/27/2022    8:42 AM 05/13/2022    9:30 AM 02/19/2022   10:25 AM 11/06/2021    2:05 PM 12/27/2020    3:22 PM 04/02/2020    8:11 AM 03/22/2019  9:45 AM  PHQ 2/9 Scores  PHQ - 2 Score 0 0 0 0 0 0 0    Fall Risk    07/23/2022    9:05 PM 05/13/2022    9:30 AM 02/19/2022   10:25 AM 11/06/2021    2:05 PM 12/27/2020    3:22 PM  Fall Risk   Falls in the past year? 0 0 0 0 0  Number falls in past yr: 0 0 0 0 0  Injury with Fall? 0 0 0 0   Risk for fall due to :  No Fall Risks No Fall Risks No Fall Risks   Follow up Falls evaluation completed;Falls prevention discussed Falls evaluation completed Falls evaluation  completed Falls evaluation completed Falls evaluation completed    FALL RISK PREVENTION PERTAINING TO THE HOME: Home free of loose throw rugs in walkways, pet beds, electrical cords, etc? Yes  Adequate lighting in your home to reduce risk of falls? Yes   ASSISTIVE DEVICES UTILIZED TO PREVENT FALLS: Life alert? No  Use of a cane, walker or w/c? No  Grab bars in the bathroom? No  Shower chair or bench in shower? No  Elevated toilet seat or a handicapped toilet? No   TIMED UP AND GO: Was the test performed? No .   Cognitive Function:        07/27/2022    9:01 AM 05/19/2018   10:20 AM 05/11/2016    9:45 AM  6CIT Screen  What Year? 0 points 0 points 0 points  What month? 0 points 0 points 0 points  What time? 0 points 0 points 0 points  Count back from 20 0 points 0 points 0 points  Months in reverse 0 points 0 points 0 points  Repeat phrase 0 points 0 points 0 points  Total Score 0 points 0 points 0 points    Immunizations Immunization History  Administered Date(s) Administered   Encephalitis 05/14/2009, 05/20/2009, 06/10/2009   Fluad Quad(high Dose 65+) 01/22/2021, 02/19/2022   Hepatitis A 05/10/2009, 10/23/2009   Hepatitis A, Adult 11/05/2016, 05/31/2017   Hepatitis B 05/10/2009, 06/17/2009, 10/23/2009   IPV 09/09/2007   Influenza, High Dose Seasonal PF 04/03/2016, 02/08/2017, 02/03/2018, 12/27/2019   Influenza,inj,Quad PF,6+ Mos 01/17/2019   Influenza-Unspecified 06/03/2009, 07/22/2010, 01/25/2014   MMR 09/09/2007   Moderna SARS-COV2 Booster Vaccination 02/23/2020, 08/30/2020   Moderna Sars-Covid-2 Vaccination 05/31/2019, 06/30/2019   PNEUMOCOCCAL CONJUGATE-20 02/19/2022   Pfizer Covid-19 Vaccine Bivalent Booster 29yrs & up 02/03/2021   Pfizer Fall 2023 Covid-19 Vaccine 25yrs thru 19yrs. 01/17/2022   Pneumococcal Conjugate-13 05/11/2016   Pneumococcal Polysaccharide-23 01/16/2013   RSV,unspecified 02/19/2022   Rabies, IM 05/08/2009, 05/14/2009, 05/29/2009   Td  06/03/2011   Tdap 06/02/2009   Typhoid Parenteral, AKD ([place]) 05/20/2009   Zoster Recombinat (Shingrix) 06/19/2018, 07/11/2018, 11/03/2018   TDAP status: Due, Education has been provided regarding the importance of this vaccine. Advised may receive this vaccine at local pharmacy or Health Dept. Aware to provide a copy of the vaccination record if obtained from local pharmacy or Health Dept. Verbalized acceptance and understanding.  Covid-19 vaccine status: Completed vaccines  Screening Tests Health Maintenance  Topic Date Due   DTaP/Tdap/Td (3 - Td or Tdap) 06/02/2021   COVID-19 Vaccine (4 - 2023-24 season) 08/12/2022 (Originally 03/14/2022)   MAMMOGRAM  10/14/2022   INFLUENZA VACCINE  11/26/2022   Medicare Annual Wellness (AWV)  07/27/2023   Pneumonia Vaccine 38+ Years old  Completed   DEXA SCAN  Completed   Hepatitis  C Screening  Completed   Zoster Vaccines- Shingrix  Completed   HPV VACCINES  Aged Out   COLONOSCOPY (Pts 45-51yrs Insurance coverage will need to be confirmed)  Discontinued    Health Maintenance Health Maintenance Due  Topic Date Due   DTaP/Tdap/Td (3 - Td or Tdap) 06/02/2021   Mammogram- deferred due to patient preference. Prefers exam every 2 years.   Lung Cancer Screening: (Low Dose CT Chest recommended if Age 68-80 years, 30 pack-year currently smoking OR have quit w/in 15years.) does not qualify.   Vision Screening: Recommended annual ophthalmology exams for early detection of glaucoma and other disorders of the eye.  Dental Screening: Recommended annual dental exams for proper oral hygiene  Community Resource Referral / Chronic Care Management: CRR required this visit?  No   CCM required this visit?  No      Plan:     I have personally reviewed and noted the following in the patient's chart:   Medical and social history Use of alcohol, tobacco or illicit drugs  Current medications and supplements including opioid prescriptions. Patient  is not currently taking opioid prescriptions. Functional ability and status Nutritional status Physical activity Advanced directives List of other physicians Hospitalizations, surgeries, and ER visits in previous 12 months Vitals Screenings to include cognitive, depression, and falls Referrals and appointments  In addition, I have reviewed and discussed with patient certain preventive protocols, quality metrics, and best practice recommendations. A written personalized care plan for preventive services as well as general preventive health recommendations were provided to patient.     Leta Jungling, LPN   [id number]

## 2022-07-27 NOTE — Patient Instructions (Addendum)
Stacey Munoz , Thank you for taking time to come for your Medicare Wellness Visit. I appreciate your ongoing commitment to your health goals. Please review the following plan we discussed and let me know if I can assist you in the future.   These are the goals we discussed:  Goals Addressed               This Visit's Progress     Patient Stated     Weight (lb) < 133 lb (60.3 kg) (pt-stated)   133 lb (60.3 kg)     Weight goal 125lb Healthy diet Stay active         This is a list of the screening recommended for you and due dates:  Health Maintenance  Topic Date Due   DTaP/Tdap/Td vaccine (3 - Td or Tdap) 06/02/2021   COVID-19 Vaccine (4 - 2023-24 season) 08/12/2022*   Mammogram  10/14/2022   Flu Shot  11/26/2022   Medicare Annual Wellness Visit  07/27/2023   Pneumonia Vaccine  Completed   DEXA scan (bone density measurement)  Completed   Hepatitis C Screening: USPSTF Recommendation to screen - Ages 74-79 yo.  Completed   Zoster (Shingles) Vaccine  Completed   HPV Vaccine  Aged Out   Colon Cancer Screening  Discontinued  *Topic was postponed. The date shown is not the original due date.    Advanced directives: on file  Conditions/risks identified: none new  Next appointment: Follow up in one year for your annual wellness visit    Preventive Care 65 Years and Older, Female Preventive care refers to lifestyle choices and visits with your health care provider that can promote health and wellness. What does preventive care include? A yearly physical exam. This is also called an annual well check. Dental exams once or twice a year. Routine eye exams. Ask your health care provider how often you should have your eyes checked. Personal lifestyle choices, including: Daily care of your teeth and gums. Regular physical activity. Eating a healthy diet. Avoiding tobacco and drug use. Limiting alcohol use. Practicing safe sex. Taking low-dose aspirin every day. Taking  vitamin and mineral supplements as recommended by your health care provider. What happens during an annual well check? The services and screenings done by your health care provider during your annual well check will depend on your age, overall health, lifestyle risk factors, and family history of disease. Counseling  Your health care provider may ask you questions about your: Alcohol use. Tobacco use. Drug use. Emotional well-being. Home and relationship well-being. Sexual activity. Eating habits. History of falls. Memory and ability to understand (cognition). Work and work Statistician. Reproductive health. Screening  You may have the following tests or measurements: Height, weight, and BMI. Blood pressure. Lipid and cholesterol levels. These may be checked every 5 years, or more frequently if you are over 23 years old. Skin check. Lung cancer screening. You may have this screening every year starting at age 64 if you have a 30-pack-year history of smoking and currently smoke or have quit within the past 15 years. Fecal occult blood test (FOBT) of the stool. You may have this test every year starting at age 44. Flexible sigmoidoscopy or colonoscopy. You may have a sigmoidoscopy every 5 years or a colonoscopy every 10 years starting at age 22. Hepatitis C blood test. Hepatitis B blood test. Sexually transmitted disease (STD) testing. Diabetes screening. This is done by checking your blood sugar (glucose) after you have not eaten for  a while (fasting). You may have this done every 1-3 years. Bone density scan. This is done to screen for osteoporosis. You may have this done starting at age 11. Mammogram. This may be done every 1-2 years. Talk to your health care provider about how often you should have regular mammograms. Talk with your health care provider about your test results, treatment options, and if necessary, the need for more tests. Vaccines  Your health care provider may  recommend certain vaccines, such as: Influenza vaccine. This is recommended every year. Tetanus, diphtheria, and acellular pertussis (Tdap, Td) vaccine. You may need a Td booster every 10 years. Zoster vaccine. You may need this after age 101. Pneumococcal 13-valent conjugate (PCV13) vaccine. One dose is recommended after age 41. Pneumococcal polysaccharide (PPSV23) vaccine. One dose is recommended after age 93. Talk to your health care provider about which screenings and vaccines you need and how often you need them. This information is not intended to replace advice given to you by your health care provider. Make sure you discuss any questions you have with your health care provider. Document Released: 05/10/2015 Document Revised: 01/01/2016 Document Reviewed: 02/12/2015 Elsevier Interactive Patient Education  2017 [place] Prevention in the Home Falls can cause injuries. They can happen to people of all ages. There are many things you can do to make your home safe and to help prevent falls. What can I do on the outside of my home? Regularly fix the edges of walkways and driveways and fix any cracks. Remove anything that might make you trip as you walk through a door, such as a raised step or threshold. Trim any bushes or trees on the path to your home. Use bright outdoor lighting. Clear any walking paths of anything that might make someone trip, such as rocks or tools. Regularly check to see if handrails are loose or broken. Make sure that both sides of any steps have handrails. Any raised decks and porches should have guardrails on the edges. Have any leaves, snow, or ice cleared regularly. Use sand or salt on walking paths during winter. Clean up any spills in your garage right away. This includes oil or grease spills. What can I do in the bathroom? Use night lights. Install grab bars by the toilet and in the tub and shower. Do not use towel bars as grab bars. Use non-skid  mats or decals in the tub or shower. If you need to sit down in the shower, use a plastic, non-slip stool. Keep the floor dry. Clean up any water that spills on the floor as soon as it happens. Remove soap buildup in the tub or shower regularly. Attach bath mats securely with double-sided non-slip rug tape. Do not have throw rugs and other things on the floor that can make you trip. What can I do in the bedroom? Use night lights. Make sure that you have a light by your bed that is easy to reach. Do not use any sheets or blankets that are too big for your bed. They should not hang down onto the floor. Have a firm chair that has side arms. You can use this for support while you get dressed. Do not have throw rugs and other things on the floor that can make you trip. What can I do in the kitchen? Clean up any spills right away. Avoid walking on wet floors. Keep items that you use a lot in easy-to-reach places. If you need to reach something above  you, use a strong step stool that has a grab bar. Keep electrical cords out of the way. Do not use floor polish or wax that makes floors slippery. If you must use wax, use non-skid floor wax. Do not have throw rugs and other things on the floor that can make you trip. What can I do with my stairs? Do not leave any items on the stairs. Make sure that there are handrails on both sides of the stairs and use them. Fix handrails that are broken or loose. Make sure that handrails are as long as the stairways. Check any carpeting to make sure that it is firmly attached to the stairs. Fix any carpet that is loose or worn. Avoid having throw rugs at the top or bottom of the stairs. If you do have throw rugs, attach them to the floor with carpet tape. Make sure that you have a light switch at the top of the stairs and the bottom of the stairs. If you do not have them, ask someone to add them for you. What else can I do to help prevent falls? Wear shoes  that: Do not have high heels. Have rubber bottoms. Are comfortable and fit you well. Are closed at the toe. Do not wear sandals. If you use a stepladder: Make sure that it is fully opened. Do not climb a closed stepladder. Make sure that both sides of the stepladder are locked into place. Ask someone to hold it for you, if possible. Clearly mark and make sure that you can see: Any grab bars or handrails. First and last steps. Where the edge of each step is. Use tools that help you move around (mobility aids) if they are needed. These include: Canes. Walkers. Scooters. Crutches. Turn on the lights when you go into a dark area. Replace any light bulbs as soon as they burn out. Set up your furniture so you have a clear path. Avoid moving your furniture around. If any of your floors are uneven, fix them. If there are any pets around you, be aware of where they are. Review your medicines with your doctor. Some medicines can make you feel dizzy. This can increase your chance of falling. Ask your doctor what other things that you can do to help prevent falls. This information is not intended to replace advice given to you by your health care provider. Make sure you discuss any questions you have with your health care provider. Document Released: 02/07/2009 Document Revised: 09/19/2015 Document Reviewed: 05/18/2014 Elsevier Interactive Patient Education  2017 [employer].

## 2022-08-09 ENCOUNTER — Encounter: Payer: Self-pay | Admitting: Family Medicine

## 2022-08-10 ENCOUNTER — Other Ambulatory Visit: Payer: Self-pay

## 2022-08-10 DIAGNOSIS — E782 Mixed hyperlipidemia: Secondary | ICD-10-CM

## 2022-08-13 ENCOUNTER — Other Ambulatory Visit (INDEPENDENT_AMBULATORY_CARE_PROVIDER_SITE_OTHER): Payer: Medicare Other

## 2022-08-13 DIAGNOSIS — E782 Mixed hyperlipidemia: Secondary | ICD-10-CM | POA: Diagnosis not present

## 2022-08-13 LAB — LIPID PANEL
Cholesterol: 189 mg/dL (ref 0–200)
HDL: 53.9 mg/dL (ref >39.00)
NonHDL: 135.02
Total CHOL/HDL Ratio: 4
Triglycerides: 263 mg/dL — ABNORMAL HIGH (ref 0.0–149.0)
VLDL: 52.6 mg/dL — ABNORMAL HIGH (ref 0.0–40.0)

## 2022-08-13 LAB — LDL CHOLESTEROL, DIRECT: Direct LDL: 110 mg/dL

## 2022-08-14 ENCOUNTER — Encounter: Payer: Self-pay | Admitting: Family Medicine

## 2022-08-18 DIAGNOSIS — M9903 Segmental and somatic dysfunction of lumbar region: Secondary | ICD-10-CM | POA: Diagnosis not present

## 2022-08-18 DIAGNOSIS — M5136 Other intervertebral disc degeneration, lumbar region: Secondary | ICD-10-CM | POA: Diagnosis not present

## 2022-08-18 DIAGNOSIS — M5416 Radiculopathy, lumbar region: Secondary | ICD-10-CM | POA: Diagnosis not present

## 2022-08-18 DIAGNOSIS — M62830 Muscle spasm of back: Secondary | ICD-10-CM | POA: Diagnosis not present

## 2022-08-19 DIAGNOSIS — M62830 Muscle spasm of back: Secondary | ICD-10-CM | POA: Diagnosis not present

## 2022-08-19 DIAGNOSIS — M5416 Radiculopathy, lumbar region: Secondary | ICD-10-CM | POA: Diagnosis not present

## 2022-08-19 DIAGNOSIS — M9903 Segmental and somatic dysfunction of lumbar region: Secondary | ICD-10-CM | POA: Diagnosis not present

## 2022-08-19 DIAGNOSIS — M5136 Other intervertebral disc degeneration, lumbar region: Secondary | ICD-10-CM | POA: Diagnosis not present

## 2022-08-20 ENCOUNTER — Other Ambulatory Visit: Payer: Self-pay

## 2022-08-20 DIAGNOSIS — E782 Mixed hyperlipidemia: Secondary | ICD-10-CM

## 2022-08-20 MED ORDER — ROSUVASTATIN CALCIUM 5 MG PO TABS: 5.0000 mg | ORAL_TABLET | Freq: Every day | ORAL | 3 refills | Status: DC

## 2022-08-20 NOTE — Telephone Encounter (Signed)
Prescription sent to Pharmacy.  

## 2022-08-25 DIAGNOSIS — M62830 Muscle spasm of back: Secondary | ICD-10-CM | POA: Diagnosis not present

## 2022-08-25 DIAGNOSIS — M5416 Radiculopathy, lumbar region: Secondary | ICD-10-CM | POA: Diagnosis not present

## 2022-08-25 DIAGNOSIS — M9903 Segmental and somatic dysfunction of lumbar region: Secondary | ICD-10-CM | POA: Diagnosis not present

## 2022-08-25 DIAGNOSIS — M5136 Other intervertebral disc degeneration, lumbar region: Secondary | ICD-10-CM | POA: Diagnosis not present

## 2022-08-26 ENCOUNTER — Ambulatory Visit: Payer: Medicare Other | Admitting: Dermatology

## 2022-08-26 VITALS — BP 124/64

## 2022-08-26 DIAGNOSIS — L719 Rosacea, unspecified: Secondary | ICD-10-CM | POA: Diagnosis not present

## 2022-08-26 DIAGNOSIS — L820 Inflamed seborrheic keratosis: Secondary | ICD-10-CM | POA: Diagnosis not present

## 2022-08-26 DIAGNOSIS — R21 Rash and other nonspecific skin eruption: Secondary | ICD-10-CM | POA: Diagnosis not present

## 2022-08-26 DIAGNOSIS — Z79899 Other long term (current) drug therapy: Secondary | ICD-10-CM | POA: Diagnosis not present

## 2022-08-26 NOTE — Patient Instructions (Signed)
Cryotherapy Aftercare  Wash gently with soap and water everyday.   Apply Vaseline and Band-Aid daily until healed.     Due to recent changes in healthcare laws, you may see results of your pathology and/or laboratory studies on MyChart before the doctors have had a chance to review them. We understand that in some cases there may be results that are confusing or concerning to you. Please understand that not all results are received at the same time and often the doctors may need to interpret multiple results in order to provide you with the best plan of care or course of treatment. Therefore, we ask that you please give us 2 business days to thoroughly review all your results before contacting the office for clarification. Should we see a critical lab result, you will be contacted sooner.   If You Need Anything After Your Visit  If you have any questions or concerns for your doctor, please call our main line at [phone] and press option 4 to reach your doctor's medical assistant. If no one answers, please leave a voicemail as directed and we will return your call as soon as possible. Messages left after 4 pm will be answered the following business day.   You may also send us a message via MyChart. We typically respond to MyChart messages within 1-2 business days.  For prescription refills, please ask your pharmacy to contact our office. Our fax number is [phone].  If you have an urgent issue when the clinic is closed that cannot wait until the next business day, you can page your doctor at the number below.    Please note that while we do our best to be available for urgent issues outside of office hours, we are not available 24/7.   If you have an urgent issue and are unable to reach us, you may choose to seek medical care at your doctor's office, retail clinic, urgent care center, or emergency room.  If you have a medical emergency, please immediately call 911 or go to the  emergency department.  Pager Numbers  - Dr. Kowalski: [phone]  - Dr. Moye: [phone]  - Dr. Stewart: [phone]  In the event of inclement weather, please call our main line at [phone] for an update on the status of any delays or closures.  Dermatology Medication Tips: Please keep the boxes that topical medications come in in order to help keep track of the instructions about where and how to use these. Pharmacies typically print the medication instructions only on the boxes and not directly on the medication tubes.   If your medication is too expensive, please contact our office at [phone] option 4 or send us a message through MyChart.   We are unable to tell what your co-pay for medications will be in advance as this is different depending on your insurance coverage. However, we may be able to find a substitute medication at lower cost or fill out paperwork to get insurance to cover a needed medication.   If a prior authorization is required to get your medication covered by your insurance company, please allow us 1-2 business days to complete this process.  Drug prices often vary depending on where the prescription is filled and some pharmacies may offer cheaper prices.  The website www.goodrx.com contains coupons for medications through different pharmacies. The prices here do not account for what the cost may be with help from insurance (it may be cheaper with your insurance), but the website can   give you the price if you did not use any insurance.  - You can print the associated coupon and take it with your prescription to the pharmacy.  - You may also stop by our office during regular business hours and pick up a GoodRx coupon card.  - If you need your prescription sent electronically to a different pharmacy, notify our office through [facility] MyChart or by phone at [phone] option 4.     Si Usted Necesita Algo Despus de Su Visita  Tambin puede  enviarnos un mensaje a travs de MyChart. Por lo general respondemos a los mensajes de MyChart en el transcurso de 1 a 2 das hbiles.  Para renovar recetas, por favor pida a su farmacia que se ponga en contacto con nuestra oficina. Nuestro nmero de fax es el [phone].  Si tiene un asunto urgente cuando la clnica est cerrada y que no puede esperar hasta el siguiente da hbil, puede llamar/localizar a su doctor(a) al nmero que aparece a continuacin.   Por favor, tenga en cuenta que aunque hacemos todo lo posible para estar disponibles para asuntos urgentes fuera del horario de oficina, no estamos disponibles las 24 horas del da, los 7 das de la semana.   Si tiene un problema urgente y no puede comunicarse con nosotros, puede optar por buscar atencin mdica  en el consultorio de su doctor(a), en una clnica privada, en un centro de atencin urgente o en una sala de emergencias.  Si tiene una emergencia mdica, por favor llame inmediatamente al 911 o vaya a la sala de emergencias.  Nmeros de bper  - Dr. Kowalski: [phone]  - Dra. Moye: [phone]  - Dra. Stewart: [phone]  En caso de inclemencias del tiempo, por favor llame a nuestra lnea principal al [phone] para una actualizacin sobre el estado de cualquier retraso o cierre.  Consejos para la medicacin en dermatologa: Por favor, guarde las cajas en las que vienen los medicamentos de uso tpico para ayudarle a seguir las instrucciones sobre dnde y cmo usarlos. Las farmacias generalmente imprimen las instrucciones del medicamento slo en las cajas y no directamente en los tubos del medicamento.   Si su medicamento es muy caro, por favor, pngase en contacto con nuestra oficina llamando al [phone] y presione la opcin 4 o envenos un mensaje a travs de MyChart.   No podemos decirle cul ser su copago por los medicamentos por adelantado ya que esto es diferente dependiendo de la cobertura de su seguro.  Sin embargo, es posible que podamos encontrar un medicamento sustituto a menor costo o llenar un formulario para que el seguro cubra el medicamento que se considera necesario.   Si se requiere una autorizacin previa para que su compaa de seguros cubra su medicamento, por favor permtanos de 1 a 2 das hbiles para completar este proceso.  Los precios de los medicamentos varan con frecuencia dependiendo del lugar de dnde se surte la receta y alguna farmacias pueden ofrecer precios ms baratos.  El sitio web www.goodrx.com tiene cupones para medicamentos de diferentes farmacias. Los precios aqu no tienen en cuenta lo que podra costar con la ayuda del seguro (puede ser ms barato con su seguro), pero el sitio web puede darle el precio si no utiliz ningn seguro.  - Puede imprimir el cupn correspondiente y llevarlo con su receta a la farmacia.  - Tambin puede pasar por nuestra oficina durante el horario de atencin regular y recoger una tarjeta de cupones de GoodRx.  -   Si necesita que su receta se enve electrnicamente a una farmacia diferente, informe a nuestra oficina a travs de MyChart de [facility] o por telfono llamando al [phone] y presione la opcin 4.  

## 2022-08-26 NOTE — Progress Notes (Signed)
   Follow-Up Visit   Subjective  Stacey Munoz is a 77 y.o. female who presents for the following: Rosacea follow up - stable. She uses Azelaic acid gel qhs. Also hx of dermal hypersensitivity reaction of back that has improved since stopping vitamin with niacin. The patient has spots, moles and lesions to be evaluated, some may be new or changing and the patient may have concern these could be cancer.  The following portions of the chart were reviewed this encounter and updated as appropriate: medications, allergies, medical history  Review of Systems:  No other skin or systemic complaints except as noted in HPI or Assessment and Plan.  Objective  Well appearing patient in no apparent distress; mood and affect are within normal limits. A focused examination was performed of the following areas:  Relevant exam findings are noted in the Assessment and Plan.  Left Lower Back Erythematous stuck-on, waxy papule or plaque   Assessment & Plan   ROSACEA Exam Mid face erythema with telangiectasias of face Chronic and persistent condition with duration or expected duration over one year. Condition is symptomatic / bothersome to patient. Not to goal. Better controlled   Rosacea is a chronic progressive skin condition usually affecting the face of adults, causing redness and/or acne bumps. It is treatable but not curable. It sometimes affects the eyes (ocular rosacea) as well. It may respond to topical and/or systemic medication and can flare with stress, sun exposure, alcohol, exercise, topical steroids (including hydrocortisone/cortisone 10) and some foods.  Daily application of broad spectrum spf 30+ sunscreen to face is recommended to reduce flares.  Treatment Plan Continue Azelaic acid/Metronidazole/Ivermectin qhs Discussed Rhofade - may consider in the future  Inflamed seborrheic keratosis Left Lower Back  Destruction of lesion - Left Lower Back Complexity: simple   Destruction  method: cryotherapy   Informed consent: discussed and consent obtained   Timeout:  patient name, date of birth, surgical site, and procedure verified Lesion destroyed using liquid nitrogen: Yes   Region frozen until ice ball extended beyond lesion: Yes   Outcome: patient tolerated procedure well with no complications   Post-procedure details: wound care instructions given    Rosacea  Related Medications Azelaic Acid 15 % gel Apply to face QHS.  Medication management  Rash  History of Dermal Hypersensitivity - resolved Exam: Clear today  Return in about 1 year (around 08/26/2023) for Rosacea follow up.  I, Joanie Coddington, CMA, am acting as scribe for Armida Sans, MD .  Documentation: I have reviewed the above documentation for accuracy and completeness, and I agree with the above.  Armida Sans, MD

## 2022-08-27 DIAGNOSIS — M5136 Other intervertebral disc degeneration, lumbar region: Secondary | ICD-10-CM | POA: Diagnosis not present

## 2022-08-27 DIAGNOSIS — M62830 Muscle spasm of back: Secondary | ICD-10-CM | POA: Diagnosis not present

## 2022-08-27 DIAGNOSIS — M5416 Radiculopathy, lumbar region: Secondary | ICD-10-CM | POA: Diagnosis not present

## 2022-08-27 DIAGNOSIS — M9903 Segmental and somatic dysfunction of lumbar region: Secondary | ICD-10-CM | POA: Diagnosis not present

## 2022-09-01 DIAGNOSIS — M5416 Radiculopathy, lumbar region: Secondary | ICD-10-CM | POA: Diagnosis not present

## 2022-09-01 DIAGNOSIS — M9903 Segmental and somatic dysfunction of lumbar region: Secondary | ICD-10-CM | POA: Diagnosis not present

## 2022-09-01 DIAGNOSIS — M5136 Other intervertebral disc degeneration, lumbar region: Secondary | ICD-10-CM | POA: Diagnosis not present

## 2022-09-01 DIAGNOSIS — M62830 Muscle spasm of back: Secondary | ICD-10-CM | POA: Diagnosis not present

## 2022-09-02 ENCOUNTER — Encounter: Payer: Self-pay | Admitting: Dermatology

## 2022-09-15 DIAGNOSIS — M5136 Other intervertebral disc degeneration, lumbar region: Secondary | ICD-10-CM | POA: Diagnosis not present

## 2022-09-15 DIAGNOSIS — M62830 Muscle spasm of back: Secondary | ICD-10-CM | POA: Diagnosis not present

## 2022-09-15 DIAGNOSIS — M9903 Segmental and somatic dysfunction of lumbar region: Secondary | ICD-10-CM | POA: Diagnosis not present

## 2022-09-15 DIAGNOSIS — M5416 Radiculopathy, lumbar region: Secondary | ICD-10-CM | POA: Diagnosis not present

## 2022-09-30 DIAGNOSIS — M9903 Segmental and somatic dysfunction of lumbar region: Secondary | ICD-10-CM | POA: Diagnosis not present

## 2022-09-30 DIAGNOSIS — M5416 Radiculopathy, lumbar region: Secondary | ICD-10-CM | POA: Diagnosis not present

## 2022-09-30 DIAGNOSIS — M62830 Muscle spasm of back: Secondary | ICD-10-CM | POA: Diagnosis not present

## 2022-09-30 DIAGNOSIS — M5136 Other intervertebral disc degeneration, lumbar region: Secondary | ICD-10-CM | POA: Diagnosis not present

## 2022-11-11 ENCOUNTER — Ambulatory Visit: Payer: Medicare Other | Admitting: Family Medicine

## 2022-11-11 ENCOUNTER — Telehealth: Payer: Self-pay

## 2022-11-11 ENCOUNTER — Encounter: Payer: Self-pay | Admitting: Family Medicine

## 2022-11-11 VITALS — BP 118/76 | HR 67 | Temp 97.4°F | Ht 64.0 in | Wt 131.6 lb

## 2022-11-11 DIAGNOSIS — E559 Vitamin D deficiency, unspecified: Secondary | ICD-10-CM | POA: Diagnosis not present

## 2022-11-11 DIAGNOSIS — E782 Mixed hyperlipidemia: Secondary | ICD-10-CM

## 2022-11-11 DIAGNOSIS — M810 Age-related osteoporosis without current pathological fracture: Secondary | ICD-10-CM | POA: Diagnosis not present

## 2022-11-11 DIAGNOSIS — M25552 Pain in left hip: Secondary | ICD-10-CM | POA: Diagnosis not present

## 2022-11-11 DIAGNOSIS — R7303 Prediabetes: Secondary | ICD-10-CM | POA: Diagnosis not present

## 2022-11-11 LAB — LIPID PANEL
Cholesterol: 209 mg/dL — ABNORMAL HIGH (ref 0–200)
HDL: 66.4 mg/dL (ref >39.00)
LDL Cholesterol: 118 mg/dL — ABNORMAL HIGH (ref 0–99)
NonHDL: 142.99
Total CHOL/HDL Ratio: 3
Triglycerides: 127 mg/dL (ref 0.0–149.0)
VLDL: 25.4 mg/dL (ref 0.0–40.0)

## 2022-11-11 LAB — HEMOGLOBIN A1C: Hgb A1c MFr Bld: 6 % (ref 4.6–6.5)

## 2022-11-11 LAB — VITAMIN D 25 HYDROXY (VIT D DEFICIENCY, FRACTURES): VITD: 39.5 ng/mL (ref 30.00–100.00)

## 2022-11-11 NOTE — Assessment & Plan Note (Signed)
Chronic issue.  Continue vitamin D and calcium supplementation.  She will call to schedule her DEXA scan.

## 2022-11-11 NOTE — Telephone Encounter (Signed)
Patient states she is calling to let Dr. Marikay Alar know that she is taking 10 mcg of D3 daily and 600 mg of Calcium daily.  Patient states Dr. Birdie Sons requested she call us back with this information.

## 2022-11-11 NOTE — Assessment & Plan Note (Signed)
Chronic issue.  Check vitamin D today.  Continue vitamin D supplementation.

## 2022-11-11 NOTE — Assessment & Plan Note (Signed)
Chronic issue.  Check A1c.  Continue healthy diet and exercise.

## 2022-11-11 NOTE — Assessment & Plan Note (Signed)
Possibly radiating from her back.  Patient declines any additional medications for this.  She notes she is okay doing what she is been doing with the chiropractor.  Discussed that we could consider physical therapy if not improving.
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
## 2022-11-11 NOTE — Assessment & Plan Note (Signed)
Chronic issue.  Check lipid panel.  Continue Crestor 5 mg daily.  Continue healthy diet and exercise.

## 2022-11-11 NOTE — Patient Instructions (Signed)
Nice to see you. Please call [phone] to schedule your bone density scan. 

## 2022-11-11 NOTE — Telephone Encounter (Signed)
Noted. We will see what her vitamin d level is on her labs.

## 2022-11-12 ENCOUNTER — Telehealth: Payer: Self-pay | Admitting: Family Medicine

## 2022-11-12 NOTE — Telephone Encounter (Signed)
Paperwork ready for your signature and placed in your needs to sign basket.

## 2022-11-12 NOTE — Telephone Encounter (Signed)
Patient dropped off document letter and a form to complete, to be filled out by provider. Patient requested to send it back via Mail within 5-days. Document is located in providers tray at front office.Please advise at [facility] [phone]

## 2022-11-13 NOTE — Telephone Encounter (Signed)
Signed.

## 2022-11-13 NOTE — Telephone Encounter (Signed)
Put in envelope and put in out going mail to be mailed to [facility]

## 2022-12-09 ENCOUNTER — Ambulatory Visit
Admission: RE | Admit: 2022-12-09 | Discharge: 2022-12-09 | Disposition: A | Discharge status: Home / Self Care | Payer: Medicare Other | Source: Ambulatory Visit | Attending: Family Medicine | Admitting: Family Medicine

## 2022-12-09 DIAGNOSIS — M810 Age-related osteoporosis without current pathological fracture: Secondary | ICD-10-CM | POA: Insufficient documentation

## 2022-12-30 DIAGNOSIS — M5136 Other intervertebral disc degeneration, lumbar region: Secondary | ICD-10-CM | POA: Diagnosis not present

## 2022-12-30 DIAGNOSIS — M62830 Muscle spasm of back: Secondary | ICD-10-CM | POA: Diagnosis not present

## 2022-12-30 DIAGNOSIS — M5416 Radiculopathy, lumbar region: Secondary | ICD-10-CM | POA: Diagnosis not present

## 2022-12-30 DIAGNOSIS — M9903 Segmental and somatic dysfunction of lumbar region: Secondary | ICD-10-CM | POA: Diagnosis not present

## 2023-01-07 ENCOUNTER — Encounter: Payer: Self-pay | Admitting: Nurse Practitioner

## 2023-01-07 ENCOUNTER — Ambulatory Visit (INDEPENDENT_AMBULATORY_CARE_PROVIDER_SITE_OTHER): Payer: Medicare Other

## 2023-01-07 ENCOUNTER — Ambulatory Visit (INDEPENDENT_AMBULATORY_CARE_PROVIDER_SITE_OTHER): Payer: Medicare Other | Admitting: Nurse Practitioner

## 2023-01-07 VITALS — BP 116/74 | HR 73 | Temp 97.8°F | Ht 64.0 in | Wt 132.0 lb

## 2023-01-07 DIAGNOSIS — S6991XA Unspecified injury of right wrist, hand and finger(s), initial encounter: Secondary | ICD-10-CM

## 2023-01-07 DIAGNOSIS — M19041 Primary osteoarthritis, right hand: Secondary | ICD-10-CM | POA: Diagnosis not present

## 2023-01-07 NOTE — Progress Notes (Signed)
Established Patient Office Visit  Subjective:  Patient ID: Stacey Munoz, female    DOB: 1945/06/23  Age: 77 y.o. MRN: [id number]  CC:  Chief Complaint  Patient presents with   Acute Visit    Right pointer finger shut in trunk lid around 10/21/22 a chunk was gone out of the front of the finger but the Patient covered it in Vaseline and wrapped it now she unwrapped it is swollen with a lump and painful    HPI  Stacey Munoz presents for bump on the R index finger. In the end of June the trunk lid accidentally closed on her index finger.  The pressure from the lid caused the damage to the finger. Since then she  wrapped her finger and was using Vaseline   on it.  Now when she unwrapped the finger she saw a lump on the finger.   Patient states that she is not able to bear weight on the finger. Yesterday she was riping paper using the index finger and she reports pain in the finger. She is able to bend and move the finger without pain.  HPI   Past Medical History:  Diagnosis Date   COVID-19    11/08/20   Hyperlipidemia    Low serum vitamin D    Osteoporosis     Past Surgical History:  Procedure Laterality Date   CATARACT EXTRACTION W/PHACO Right 09/16/2021   Procedure: CATARACT EXTRACTION PHACO AND INTRAOCULAR LENS PLACEMENT (IOC) RIGHT 3.80 00:36.0;  Surgeon: Galen Manila, MD;  Location: [facility];  Service: Ophthalmology;  Laterality: Right;  needs to have an AM appointment   CATARACT EXTRACTION W/PHACO Left 09/30/2021   Procedure: CATARACT EXTRACTION PHACO AND INTRAOCULAR LENS PLACEMENT (IOC) LEFT 5.17 00:33.9;  Surgeon: Galen Manila, MD;  Location: [facility];  Service: Ophthalmology;  Laterality: Left;   COLONOSCOPY WITH PROPOFOL N/A 05/20/2021   Procedure: COLONOSCOPY WITH PROPOFOL;  Surgeon: Midge Minium, MD;  Location: [facility];  Service: Endoscopy;  Laterality: N/A;   FACIAL COSMETIC SURGERY     FRACTURE SURGERY     KNEE ARTHROSCOPY Right  01/2011   torn meniscus   ROOT CANAL Left    04/2021 surgery schedule   TRIGGER FINGER RELEASE Right 08/2014   TUBAL LIGATION     WRIST FRACTURE SURGERY     left    Family History  Problem Relation Age of Onset   Cancer Mother        breast cancer   Breast cancer Mother 67   Hypertension Mother    Hyperlipidemia Mother    Diabetes Father    Cancer Father        lymphoma    Heart disease Brother    Clotting disorder Brother        on Coumadin    Kidney disease Maternal Grandmother        died young age when her mother was 43 y.o    Cancer Maternal Grandfather    Heart attack Maternal Grandfather    Cancer Paternal Grandmother        ?breast cancer   Breast cancer Paternal Grandmother        PT think it was cancer; never got check    Other Paternal Grandfather        polio   CAD Son        x 2   Gout Son    CAD Son        cad age 59 y.o  Social History   Socioeconomic History   Marital status: Single    Spouse name: Not on file   Number of children: 3   Years of education: Not on file   Highest education level: Doctorate  Occupational History    Employer: RETIRED  Tobacco Use   Smoking status: Never   Smokeless tobacco: Never   Tobacco comments:    smoking cessation materials not required  Vaping Use   Vaping status: Never Used  Substance and Sexual Activity   Alcohol use: Yes    Alcohol/week: 1.0 standard drink of alcohol    Types: 1 Glasses of wine per week    Comment: rare   Drug use: No   Sexual activity: Not Currently  Other Topics Concern   Not on file  Social History Narrative   [employer] used to work    3 sons    DPR son Halford Chessman [phone]   7 grandchildren 1 boy and 6 girls    Used to horse back ride    Likes exercise silver sneakers, massage 1 x per month, yoga walks 2-3 miles daily and does weight lifting    Never smoker    Social Determinants of Health   Financial Resource Strain: Low Risk  (01/06/2023)   Overall  Financial Resource Strain (CARDIA)    Difficulty of Paying Living Expenses: Not hard at all  Food Insecurity: No Food Insecurity (01/06/2023)   Hunger Vital Sign    Worried About Running Out of Food in the Last Year: Never true    Ran Out of Food in the Last Year: Never true  Transportation Needs: No Transportation Needs (01/06/2023)   PRAPARE - Administrator, Civil Service (Medical): No    Lack of Transportation (Non-Medical): No  Physical Activity: Sufficiently Active (01/06/2023)   Exercise Vital Sign    Days of Exercise per Week: 7 days    Minutes of Exercise per Session: 50 min  Stress: No Stress Concern Present (01/06/2023)   [employer] of Occupational Health - Occupational Stress Questionnaire    Feeling of Stress : Only a little  Social Connections: Unknown (01/06/2023)   Social Connection and Isolation Panel [NHANES]    Frequency of Communication with Friends and Family: Once a week    Frequency of Social Gatherings with Friends and Family: Twice a week    Attends Religious Services: More than 4 times per year    Active Member of [employer] or Organizations: Yes    Attends Banker Meetings: More than 4 times per year    Marital Status: Patient declined  Intimate Partner Violence: Not At Risk (07/27/2022)   Humiliation, Afraid, Rape, and Kick questionnaire    Fear of Current or Ex-Partner: No    Emotionally Abused: No    Physically Abused: No    Sexually Abused: No     Outpatient Medications Prior to Visit  Medication Sig Dispense Refill   Azelaic Acid 15 % gel Apply to face QHS. 45 g 1   CALCIUM PO Take 1,000 mg by mouth daily at 12 noon.     Cholecalciferol (VITAMIN D3 PO) Take 20 mcg by mouth daily at 12 noon.     Flaxseed Oil OIL Take 30 mLs by mouth daily at 12 noon. 2 tablespoons = 30 mL     rosuvastatin (CRESTOR) 5 MG tablet Take 1 tablet (5 mg total) by mouth daily. 90 tablet 3   Misc Natural Products (ELDERBERRY ZINC/VIT  C/IMMUNE MT) Use  as directed in the mouth or throat daily at 12 noon.     No facility-administered medications prior to visit.    No Known Allergies  ROS Review of Systems Negative unless indicated in HPI.    Objective:    Physical Exam Constitutional:      Appearance: Normal appearance.  HENT:     Head: Normocephalic.  Cardiovascular:     Rate and Rhythm: Normal rate and regular rhythm.     Pulses: Normal pulses.     Heart sounds: Normal heart sounds.  Pulmonary:     Effort: Pulmonary effort is normal.     Breath sounds: Normal breath sounds.  Musculoskeletal:     Comments: No tenderness with movement of the R index finger. No sign of infectioin.   Skin:    General: Skin is warm.  Neurological:     General: No focal deficit present.     Mental Status: She is alert and oriented to person, place, and time.  Psychiatric:        Mood and Affect: Mood normal.        Behavior: Behavior normal.        Thought Content: Thought content normal.     BP 116/74   Pulse 73   Temp 97.8 F (36.6 C)   Ht 5\' 4"  (1.626 m)   Wt 132 lb (59.9 kg)   SpO2 97%   BMI 22.66 kg/m  Wt Readings from Last 3 Encounters:  01/07/23 132 lb (59.9 kg)  11/11/22 131 lb 9.6 oz (59.7 kg)  07/27/22 133 lb (60.3 kg)     Health Maintenance  Topic Date Due   COVID-19 Vaccine (4 - 2023-24 season) 01/23/2023 (Originally 12/27/2022)   INFLUENZA VACCINE  07/26/2023 (Originally 11/26/2022)   Medicare Annual Wellness (AWV)  07/27/2023   MAMMOGRAM  10/14/2023   DTaP/Tdap/Td (5 - Td or Tdap) 05/14/2032   Pneumonia Vaccine 52+ Years old  Completed   DEXA SCAN  Completed   Hepatitis C Screening  Completed   Zoster Vaccines- Shingrix  Completed   HPV VACCINES  Aged Out   Colonoscopy  Discontinued    There are no preventive care reminders to display for this patient.  Lab Results  Component Value Date   TSH 2.20 05/08/2022   Lab Results  Component Value Date   WBC 3.8 (L) 05/08/2022   HGB 13.4 05/08/2022   HCT  39.6 05/08/2022   MCV 92.0 05/08/2022   PLT 195.0 05/08/2022   Lab Results  Component Value Date   NA 140 05/08/2022   K 3.8 05/08/2022   CO2 30 05/08/2022   GLUCOSE 99 05/08/2022   BUN 13 05/08/2022   CREATININE 0.76 05/08/2022   BILITOT 0.8 06/25/2022   ALKPHOS 69 06/25/2022   AST 18 06/25/2022   ALT 19 06/25/2022   PROT 6.4 06/25/2022   ALBUMIN 4.2 06/25/2022   CALCIUM 9.6 05/08/2022   GFR 75.66 05/08/2022   Lab Results  Component Value Date   CHOL 209 (H) 11/11/2022   Lab Results  Component Value Date   HDL 66.40 11/11/2022   Lab Results  Component Value Date   LDLCALC 118 (H) 11/11/2022   Lab Results  Component Value Date   TRIG 127.0 11/11/2022   Lab Results  Component Value Date   CHOLHDL 3 11/11/2022   Lab Results  Component Value Date   HGBA1C 6.0 11/11/2022      Assessment & Plan:  Finger injury, right,  initial encounter Assessment & Plan: No tenderness with movement. X ray pending .  Advise patient to perform finger flexion and extension exercises.    Orders: -     DG Finger Index Right    Follow-up: Return if symptoms worsen or fail to improve.   Kara Dies, NP

## 2023-01-07 NOTE — Patient Instructions (Addendum)
Please go to the lab for Xray.

## 2023-01-10 ENCOUNTER — Encounter: Payer: Self-pay | Admitting: Nurse Practitioner

## 2023-01-10 DIAGNOSIS — S6991XA Unspecified injury of right wrist, hand and finger(s), initial encounter: Secondary | ICD-10-CM | POA: Insufficient documentation

## 2023-01-10 NOTE — Assessment & Plan Note (Addendum)
No tenderness with movement. X ray pending .  Advise patient to perform finger flexion and extension exercises.

## 2023-01-15 ENCOUNTER — Encounter: Payer: Self-pay | Admitting: Nurse Practitioner

## 2023-01-15 ENCOUNTER — Telehealth: Payer: Self-pay | Admitting: *Deleted

## 2023-01-15 NOTE — Telephone Encounter (Signed)
Pt walked into looking for x-ray results from finger done on 01/07/23.  I have called the reading room & had them move images up to the front of the line.  Pt also made aware. Results should be read today, but no later than Monday

## 2023-01-17 NOTE — Telephone Encounter (Signed)
Please call patient to discuss the x-ray result of the finger.  No acute or healing fracture.  Osteoarthritis of the finger has been noticed. If her symptoms are not improving we can refer her for physical therapy.

## 2023-01-18 NOTE — Telephone Encounter (Signed)
Spoke with pt and she gave a verbal understanding.

## 2023-01-20 ENCOUNTER — Telehealth: Payer: Self-pay

## 2023-01-20 NOTE — Telephone Encounter (Signed)
Pt saw Evelene Croon NP on 01/07/23, Xray was done and pt was informed of results. Please advise

## 2023-01-20 NOTE — Telephone Encounter (Signed)
Noted. I looked at the x-ray report and Kaur's note. If her finger is still bothering her the best thing to do would be to [facility] to have an orthopedic specialist evaluate this for her to determine exactly what is going on and what type of x-ray would be recommended if it was felt she needed another x-ray. If needed I can refer her to orthopedics as well.

## 2023-01-20 NOTE — Telephone Encounter (Signed)
Patient came into the office regarding Rosacea triple mix cream from [facility]. She does not want to pay $50 out of pocket anymore and wants to pick up locally. I did try explaining to the patient she was getting multiple products into one and the three copays together from a [facility] would cost just as much. Patient still declined and asked for something generic that she can pick up in town.

## 2023-01-20 NOTE — Telephone Encounter (Signed)
Left message for patient to return my call. aw 

## 2023-01-21 ENCOUNTER — Telehealth: Payer: Self-pay

## 2023-01-21 MED ORDER — METRONIDAZOLE 0.75 % EX CREA: TOPICAL_CREAM | CUTANEOUS | 6 refills | Status: AC

## 2023-01-21 NOTE — Telephone Encounter (Signed)
Called pt discussed treatment option, pt would like 1 cream treatment, we will call in Metronidazole gel qd-bid  To [facility] [address]

## 2023-02-11 ENCOUNTER — Ambulatory Visit
Admission: EM | Admit: 2023-02-11 | Discharge: 2023-02-11 | Disposition: A | Discharge status: Home / Self Care | Payer: Medicare Other | Attending: Emergency Medicine | Admitting: Emergency Medicine

## 2023-02-11 DIAGNOSIS — S61216A Laceration without foreign body of right little finger without damage to nail, initial encounter: Secondary | ICD-10-CM

## 2023-02-11 NOTE — ED Provider Notes (Signed)
Renaldo Fiddler    CSN: [id number] Arrival date & time: 02/11/23  0802      History   Chief Complaint Chief Complaint  Patient presents with   Laceration    HPI Stacey Munoz is a 77 y.o. female.   Patient presents for relation of a laceration to the right hand that occurred this dishes, cut on glass.  Able to control bleeding.  Last tetanus 2023.  Has full movement of all fingers and hand.  Denies numbness and tingling.  Past Medical History:  Diagnosis Date   COVID-19    11/08/20   Hyperlipidemia    Low serum vitamin D    Osteoporosis     Patient Active Problem List   Diagnosis Date Noted   Finger injury, right, initial encounter 01/10/2023   Left hip pain 11/11/2022   DDD (degenerative disc disease), lumbosacral 11/06/2021   Lumbar radiculopathy 11/06/2021   Uterine fibroid 11/06/2021   Hip arthritis 11/06/2021   History of colonic polyps    Polyp of ascending colon    Positive colorectal cancer screening using Cologuard test 05/12/2021   Patient travels 12/27/2020   Arthritis of lumbar spine 10/01/2020   Left lumbar radiculopathy 04/02/2020   Prediabetes 04/03/2019   Thyroid nodule 03/22/2019   Osteoporosis 08/24/2017   DNR (do not resuscitate) discussion 05/23/2015   Diminished hearing 01/22/2015   Vitamin D deficiency 10/15/2014   Hyperlipidemia 10/15/2014   Retained orthopedic hardware 04/14/2013   Triggering of digit 02/03/2013   Closed fracture of lower end of left radius with malunion 02/03/2013    Past Surgical History:  Procedure Laterality Date   CATARACT EXTRACTION W/PHACO Right 09/16/2021   Procedure: CATARACT EXTRACTION PHACO AND INTRAOCULAR LENS PLACEMENT (IOC) RIGHT 3.80 00:36.0;  Surgeon: Galen Manila, MD;  Location: [facility];  Service: Ophthalmology;  Laterality: Right;  needs to have an AM appointment   CATARACT EXTRACTION W/PHACO Left 09/30/2021   Procedure: CATARACT EXTRACTION PHACO AND INTRAOCULAR LENS PLACEMENT  (IOC) LEFT 5.17 00:33.9;  Surgeon: Galen Manila, MD;  Location: [facility];  Service: Ophthalmology;  Laterality: Left;   COLONOSCOPY WITH PROPOFOL N/A 05/20/2021   Procedure: COLONOSCOPY WITH PROPOFOL;  Surgeon: Midge Minium, MD;  Location: [facility];  Service: Endoscopy;  Laterality: N/A;   FACIAL COSMETIC SURGERY     FRACTURE SURGERY     KNEE ARTHROSCOPY Right 01/2011   torn meniscus   ROOT CANAL Left    04/2021 surgery schedule   TRIGGER FINGER RELEASE Right 08/2014   TUBAL LIGATION     WRIST FRACTURE SURGERY     left    OB History   No obstetric history on file.      Home Medications    Prior to Admission medications   Medication Sig Start Date End Date Taking? Authorizing Provider  Azelaic Acid 15 % gel Apply to face QHS. 05/18/22   Deirdre Evener, MD  CALCIUM PO Take 1,000 mg by mouth daily at 12 noon.    [provider]  Cholecalciferol (VITAMIN D3 PO) Take 20 mcg by mouth daily at 12 noon.    [provider]  Flaxseed Oil OIL Take 30 mLs by mouth daily at 12 noon. 2 tablespoons = 30 mL    [provider]  metroNIDAZOLE (METROCREAM) 0.75 % cream Apply to affected skin qd-bid 01/21/23   Deirdre Evener, MD  rosuvastatin (CRESTOR) 5 MG tablet Take 1 tablet (5 mg total) by mouth daily. 08/20/22   Marikay Alar  G, MD    Family History Family History  Problem Relation Age of Onset   Cancer Mother        breast cancer   Breast cancer Mother 42   Hypertension Mother    Hyperlipidemia Mother    Diabetes Father    Cancer Father        lymphoma    Heart disease Brother    Clotting disorder Brother        on Coumadin    Kidney disease Maternal Grandmother        died young age when her mother was 31 y.o    Cancer Maternal Grandfather    Heart attack Maternal Grandfather    Cancer Paternal Grandmother        ?breast cancer   Breast cancer Paternal Grandmother        PT think it was cancer; never got check    Other  Paternal Grandfather        polio   CAD Son        x 2   Gout Son    CAD Son        cad age 71 y.o    Social History Social History   Tobacco Use   Smoking status: Never   Smokeless tobacco: Never   Tobacco comments:    smoking cessation materials not required  Vaping Use   Vaping status: Never Used  Substance Use Topics   Alcohol use: Yes    Alcohol/week: 1.0 standard drink of alcohol    Types: 1 Glasses of wine per week    Comment: rare   Drug use: No     Allergies   Patient has no known allergies.   Review of Systems Review of Systems   Physical Exam Triage Vital Signs ED Triage Vitals  Encounter Vitals Group     BP 02/11/23 0820 138/88     Systolic BP Percentile --      Diastolic BP Percentile --      Pulse Rate 02/11/23 0820 74     Resp 02/11/23 0820 18     Temp 02/11/23 0820 97.9 F (36.6 C)     Temp src --      SpO2 02/11/23 0820 98 %     Weight --      Height --      Head Circumference --      Peak Flow --      Pain Score 02/11/23 0811 7     Pain Loc --      Pain Education --      Exclude from Growth Chart --    No data found.  Updated Vital Signs BP 138/88   Pulse 74   Temp 97.9 F (36.6 C)   Resp 18   SpO2 98%   Visual Acuity Right Eye Distance:   Left Eye Distance:   Bilateral Distance:    Right Eye Near:   Left Eye Near:    Bilateral Near:     Physical Exam Constitutional:      Appearance: Normal appearance.  Eyes:     Extraocular Movements: Extraocular movements intact.  Pulmonary:     Effort: Pulmonary effort is normal.  Skin:    Comments: 1 cm laceration on the aspect of the dorsum of the distal phalanx of the right hand, has full range of motion, sensation intact, capillary refill less than 3, 2+ radial pulse  Neurological:     Mental Status: She is alert  and oriented to person, place, and time. Mental status is at baseline.      UC Treatments / Results  Labs (all labs ordered are listed, but only abnormal  results are displayed) Labs Reviewed - No data to display  EKG   Radiology No results found.  Procedures Laceration Repair  Date/Time: 02/11/2023 8:45 AM  Performed by: Valinda Hoar, NP Authorized by: Valinda Hoar, NP   Consent:    Consent obtained:  Verbal   Consent given by:  Patient   Risks, benefits, and alternatives were discussed: yes     Risks discussed:  Infection and pain Universal protocol:    Procedure explained and questions answered to patient or proxy's satisfaction: yes     Patient identity confirmed:  Verbally with patient Anesthesia:    Anesthesia method:  None Laceration details:    Location:  Finger   Finger location:  R small finger   Length (cm):  1   Depth (mm):  0.5 Pre-procedure details:    Preparation:  Patient was prepped and draped in usual sterile fashion Exploration:    Limited defect created (wound extended): no     Wound exploration: entire depth of wound visualized   Treatment:    Area cleansed with:  Chlorhexidine Skin repair:    Repair method:  Tissue adhesive Approximation:    Approximation:  Close Repair type:    Repair type:  Simple Post-procedure details:    Dressing:  Non-adherent dressing   Procedure completion:  Tolerated  (including critical care time)  Medications Ordered in UC Medications - No data to display  Initial Impression / Assessment and Plan / UC Course  I have reviewed the triage vital signs and the nursing notes.  Pertinent labs & imaging results that were available during my care of the patient were reviewed by me and considered in my medical decision making (see chart for details).  Laceration of right little finger without foreign body without damage to nail, initial encounter  Able to read here with skin adhesive, tolerated well, last tetanus 2023, up-to-date also offending agent is a kitchen utensil, advised daily cleansing with soap and water, may leave open to air or cover with a  nonadhesive dressing if at risk for contamination, may use over-the-counter analgesics as needed for pain advised to monitor for any signs of infection and to return if this occurs or for any concerns regarding healing Final Clinical Impressions(s) / UC Diagnoses   Final diagnoses:  Laceration of right little finger without foreign body without damage to nail, initial encounter     Discharge Instructions      Your evaluated for the laceration to the right pinky finger/hand  Able to read here with skin adhesive, leave pressure dressing in place until your dinner this evening then you may remove, this helps to stop and control bleeding  Skin adhesive will fall off naturally, do not remove, this allows for time for the skin to reattach which helps it to heal quicker  Cleanse daily with soap and water, pat do not rub  You may leave area open to air, please cover with a nonstick Band-Aid if at risk for becoming dirty  May use Tylenol and/or Motrin as needed for pain  Watch for signs of infection including redness, swelling, increased pain or pus, if this occurs at any point please follow-up with urgent care or your primary doctor as you will need initiation of antibiotics for treatment  You may return at any  point for any concerns regarding healing   ED Prescriptions   None    PDMP not reviewed this encounter.   Valinda Hoar, [place] 02/11/23 [phone]

## 2023-02-11 NOTE — ED Triage Notes (Signed)
Patient to Urgent Care with complaints of a laceration present to her right hand. Reports that she cut her hand on a piece of broken glass this morning while doing dishes.   Bleeding well controlled at this time with dressing in place.  Tdap 05/12/2021

## 2023-02-11 NOTE — Discharge Instructions (Signed)
Your evaluated for the laceration to the right pinky finger/hand  Able to read here with skin adhesive, leave pressure dressing in place until your dinner this evening then you may remove, this helps to stop and control bleeding  Skin adhesive will fall off naturally, do not remove, this allows for time for the skin to reattach which helps it to heal quicker  Cleanse daily with soap and water, pat do not rub  You may leave area open to air, please cover with a nonstick Band-Aid if at risk for becoming dirty  May use Tylenol and/or Motrin as needed for pain  Watch for signs of infection including redness, swelling, increased pain or pus, if this occurs at any point please follow-up with urgent care or your primary doctor as you will need initiation of antibiotics for treatment  You may return at any point for any concerns regarding healing

## 2023-02-18 DIAGNOSIS — M5416 Radiculopathy, lumbar region: Secondary | ICD-10-CM | POA: Diagnosis not present

## 2023-02-18 DIAGNOSIS — M62830 Muscle spasm of back: Secondary | ICD-10-CM | POA: Diagnosis not present

## 2023-02-18 DIAGNOSIS — M9903 Segmental and somatic dysfunction of lumbar region: Secondary | ICD-10-CM | POA: Diagnosis not present

## 2023-02-18 DIAGNOSIS — M5136 Other intervertebral disc degeneration, lumbar region with discogenic back pain only: Secondary | ICD-10-CM | POA: Diagnosis not present

## 2023-02-24 IMAGING — MG MM DIGITAL SCREENING BILAT W/ TOMO AND CAD
8 series · 8 of 24 positions shown · non-contrast
Comparison: Previous exam(s).

CLINICAL DATA: Screening.

EXAM:
DIGITAL SCREENING BILATERAL MAMMOGRAM WITH TOMOSYNTHESIS AND CAD
TECHNIQUE: Bilateral screening digital craniocaudal and mediolateral oblique
mammograms were obtained. Bilateral screening digital breast
tomosynthesis was performed. The images were evaluated with
computer-aided detection.

[R CC synth-2D]
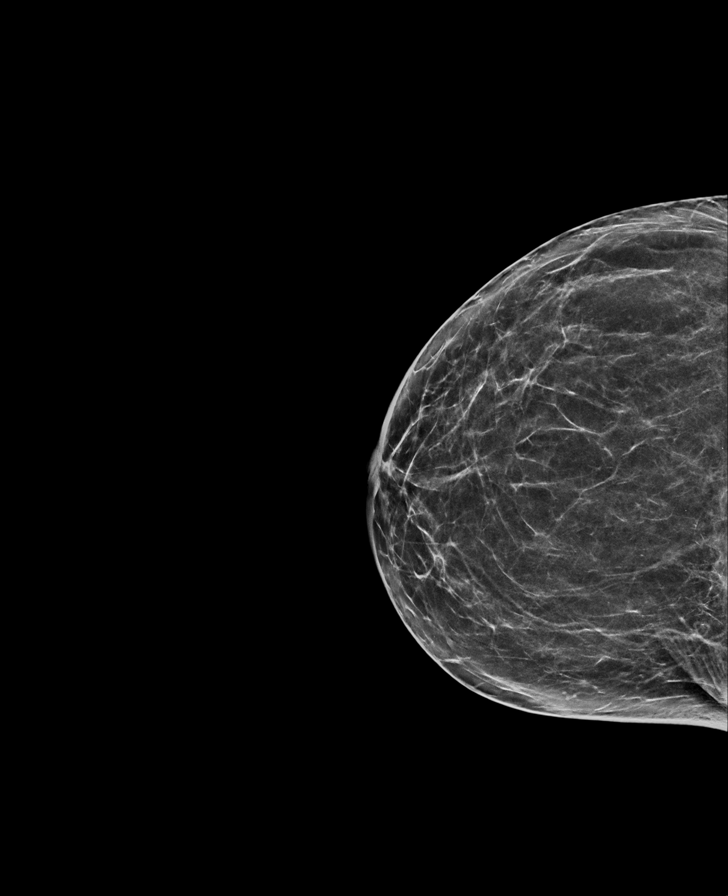

[R MLO synth-2D]
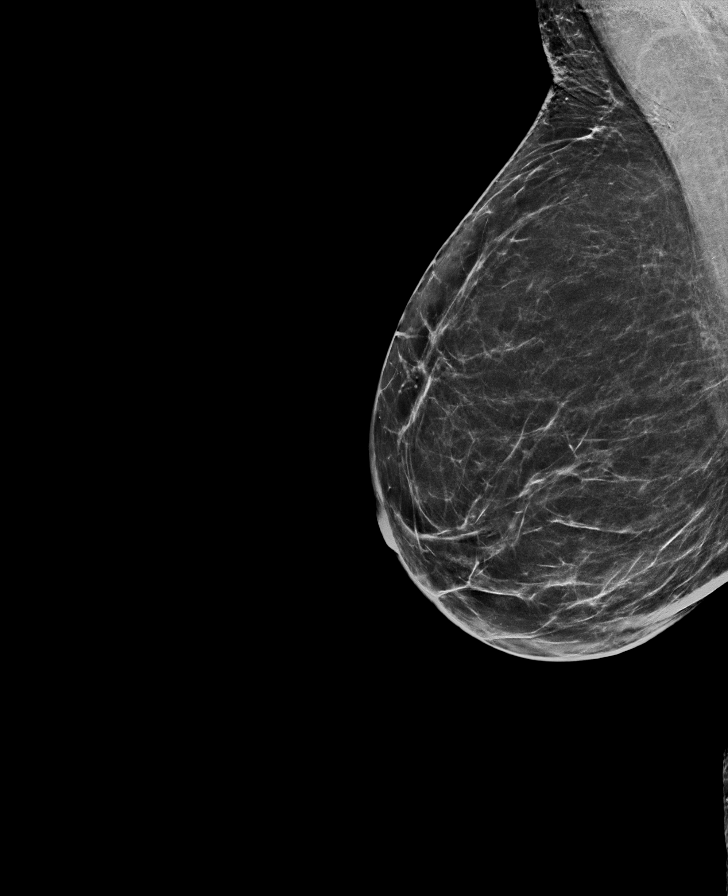

[L CC synth-2D]
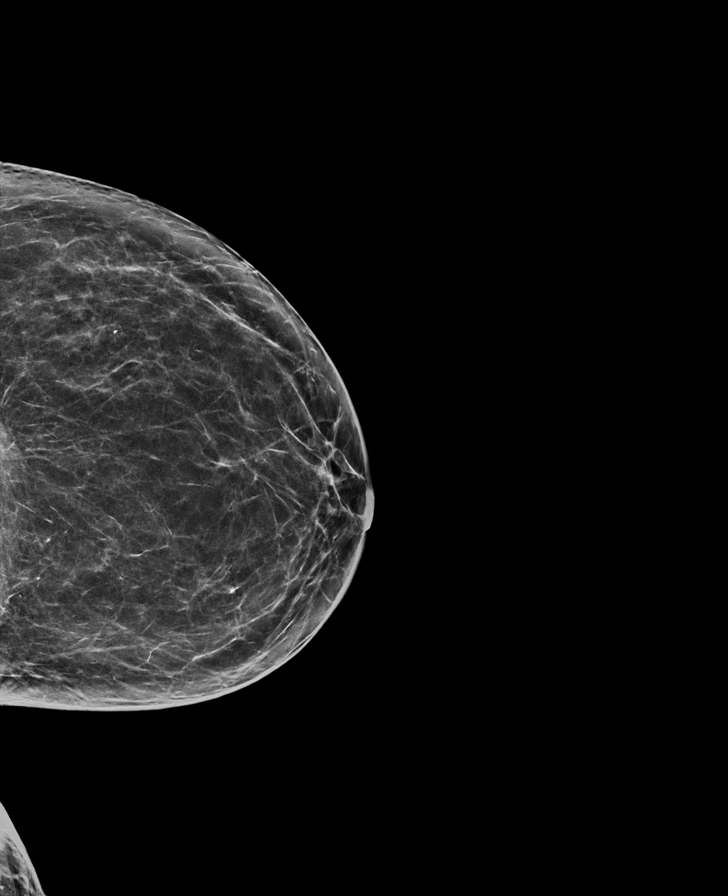

[L MLO synth-2D]
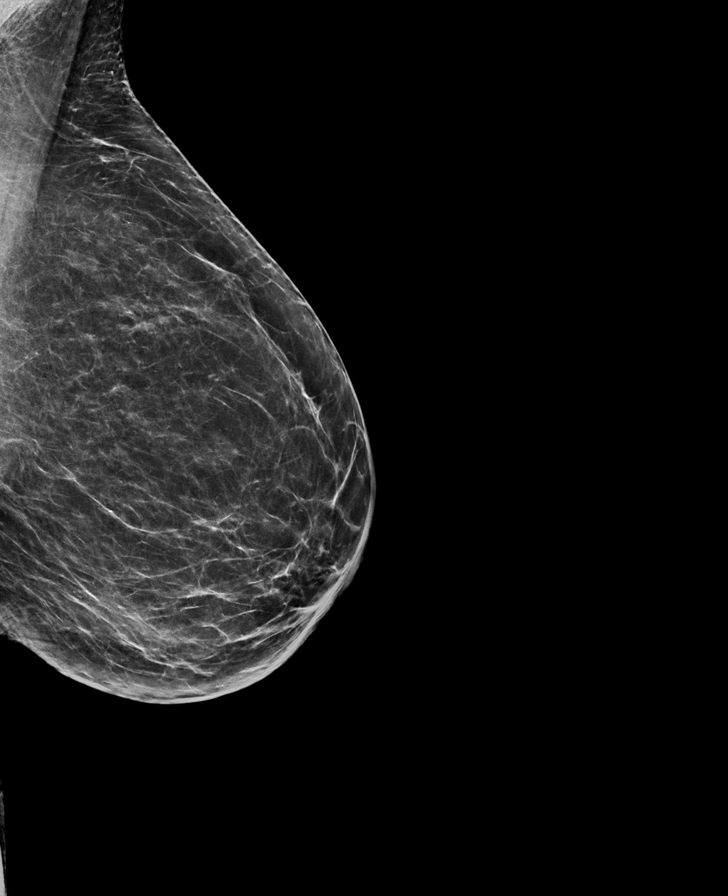

[L CC tomo · tomo slice 33/64.0]
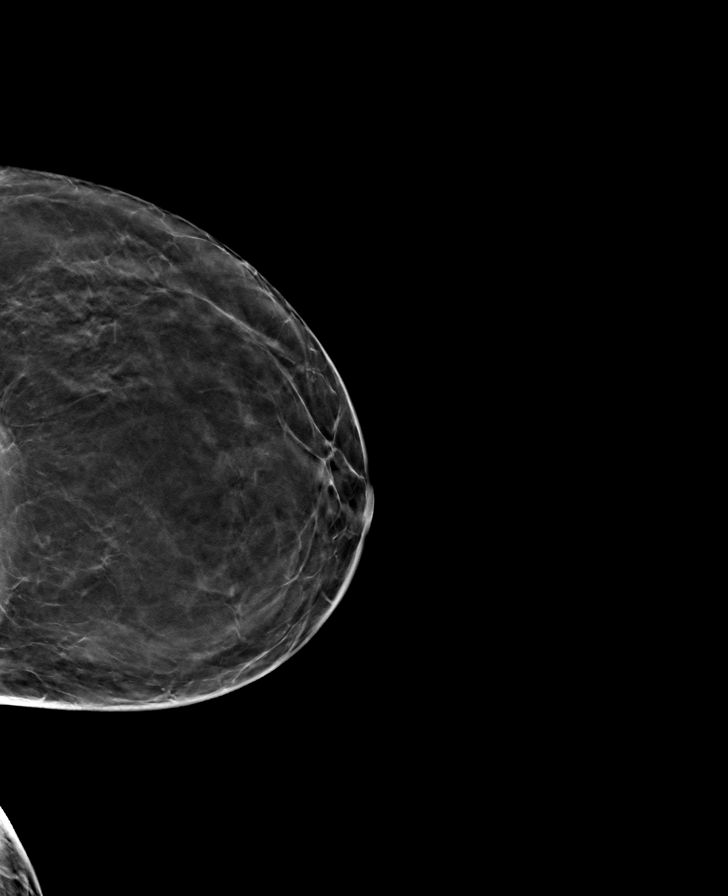

[L MLO tomo · tomo slice 33/66.0]
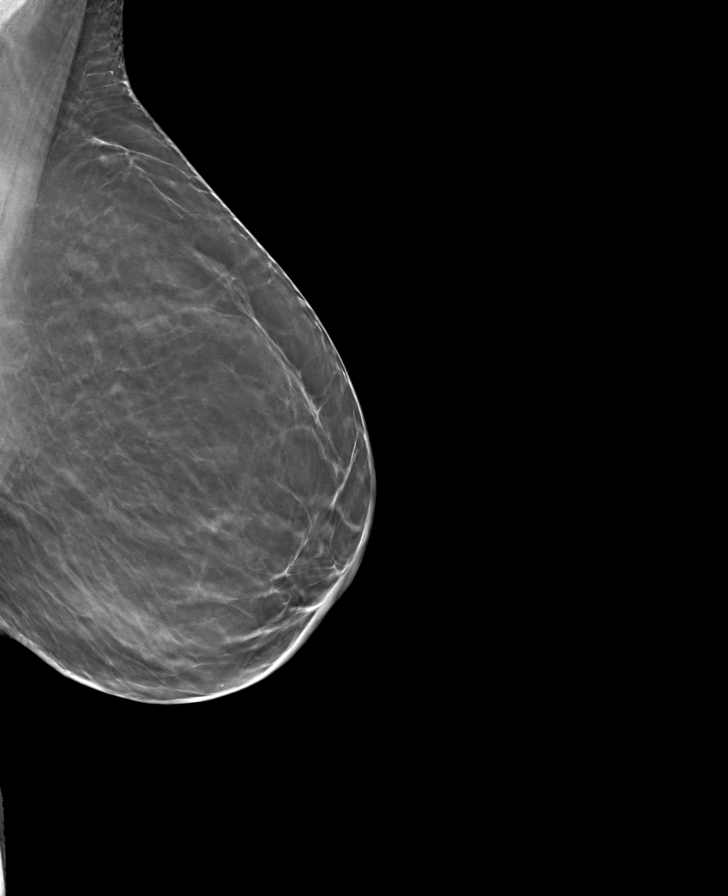

[R MLO tomo · tomo slice 34/67.0]
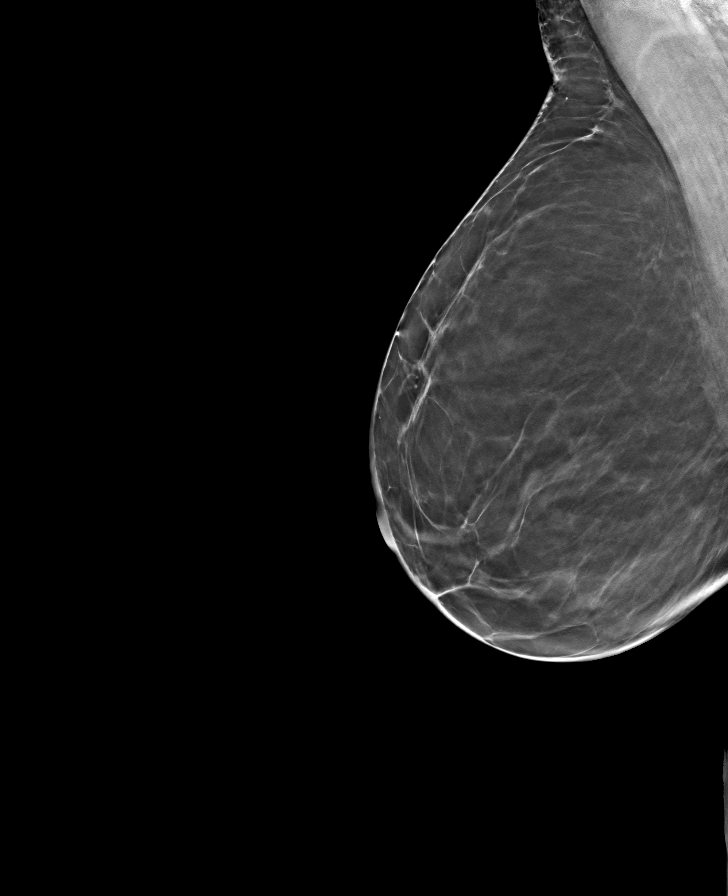

[R CC tomo · tomo slice 33/64.0]
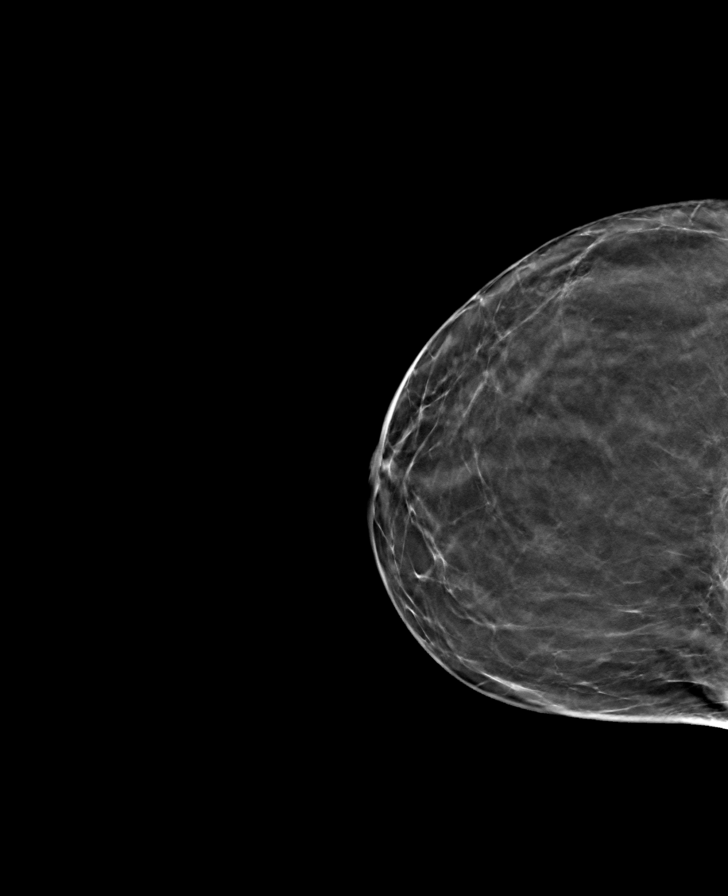

[8 of 24 positions shown; findings below may reference images not displayed]

ACR Breast Density Category b: There are scattered areas of
fibroglandular density.
FINDINGS: There are no findings suspicious for malignancy. The images were
evaluated with computer-aided detection.
IMPRESSION: No mammographic evidence of malignancy. A result letter of this
screening mammogram will be mailed directly to the patient.

RECOMMENDATION:
Screening mammogram in one year. (Code:WJ-I-BG6)

BI-RADS CATEGORY  1: Negative.

## 2023-03-16 DIAGNOSIS — M5416 Radiculopathy, lumbar region: Secondary | ICD-10-CM | POA: Diagnosis not present

## 2023-03-16 DIAGNOSIS — M9903 Segmental and somatic dysfunction of lumbar region: Secondary | ICD-10-CM | POA: Diagnosis not present

## 2023-03-16 DIAGNOSIS — M62830 Muscle spasm of back: Secondary | ICD-10-CM | POA: Diagnosis not present

## 2023-03-16 DIAGNOSIS — M5136 Other intervertebral disc degeneration, lumbar region with discogenic back pain only: Secondary | ICD-10-CM | POA: Diagnosis not present

## 2023-04-02 ENCOUNTER — Encounter: Payer: Self-pay | Admitting: Family Medicine

## 2023-04-06 DIAGNOSIS — M62830 Muscle spasm of back: Secondary | ICD-10-CM | POA: Diagnosis not present

## 2023-04-06 DIAGNOSIS — M5136 Other intervertebral disc degeneration, lumbar region with discogenic back pain only: Secondary | ICD-10-CM | POA: Diagnosis not present

## 2023-04-06 DIAGNOSIS — M9903 Segmental and somatic dysfunction of lumbar region: Secondary | ICD-10-CM | POA: Diagnosis not present

## 2023-04-06 DIAGNOSIS — M5416 Radiculopathy, lumbar region: Secondary | ICD-10-CM | POA: Diagnosis not present

## 2023-05-11 DIAGNOSIS — M9903 Segmental and somatic dysfunction of lumbar region: Secondary | ICD-10-CM | POA: Diagnosis not present

## 2023-05-11 DIAGNOSIS — M5136 Other intervertebral disc degeneration, lumbar region with discogenic back pain only: Secondary | ICD-10-CM | POA: Diagnosis not present

## 2023-05-11 DIAGNOSIS — M62830 Muscle spasm of back: Secondary | ICD-10-CM | POA: Diagnosis not present

## 2023-05-11 DIAGNOSIS — M5416 Radiculopathy, lumbar region: Secondary | ICD-10-CM | POA: Diagnosis not present

## 2023-05-12 ENCOUNTER — Encounter: Payer: Self-pay | Admitting: Family Medicine

## 2023-05-12 ENCOUNTER — Ambulatory Visit (INDEPENDENT_AMBULATORY_CARE_PROVIDER_SITE_OTHER): Payer: Medicare Other | Admitting: Family Medicine

## 2023-05-12 VITALS — BP 128/78 | HR 81 | Temp 97.6°F | Resp 17 | Ht 64.0 in | Wt 135.5 lb

## 2023-05-12 DIAGNOSIS — S61411D Laceration without foreign body of right hand, subsequent encounter: Secondary | ICD-10-CM

## 2023-05-12 DIAGNOSIS — F419 Anxiety disorder, unspecified: Secondary | ICD-10-CM | POA: Insufficient documentation

## 2023-05-12 DIAGNOSIS — E782 Mixed hyperlipidemia: Secondary | ICD-10-CM

## 2023-05-12 DIAGNOSIS — J309 Allergic rhinitis, unspecified: Secondary | ICD-10-CM | POA: Diagnosis not present

## 2023-05-12 DIAGNOSIS — R7303 Prediabetes: Secondary | ICD-10-CM

## 2023-05-12 DIAGNOSIS — F32A Depression, unspecified: Secondary | ICD-10-CM | POA: Insufficient documentation

## 2023-05-12 DIAGNOSIS — S61419A Laceration without foreign body of unspecified hand, initial encounter: Secondary | ICD-10-CM | POA: Insufficient documentation

## 2023-05-12 LAB — HEMOGLOBIN A1C: Hgb A1c MFr Bld: 6 % (ref 4.6–6.5)

## 2023-05-12 LAB — HEPATIC FUNCTION PANEL
ALT: 20 U/L (ref 0–35)
AST: 18 U/L (ref 0–37)
Albumin: 4.6 g/dL (ref 3.5–5.2)
Alkaline Phosphatase: 77 U/L (ref 39–117)
Bilirubin, Direct: 0.1 mg/dL (ref 0.0–0.3)
Total Bilirubin: 0.7 mg/dL (ref 0.2–1.2)
Total Protein: 7.1 g/dL (ref 6.0–8.3)

## 2023-05-12 LAB — LDL CHOLESTEROL, DIRECT: Direct LDL: 101 mg/dL

## 2023-05-12 NOTE — Assessment & Plan Note (Signed)
 This area has healed well.  Discussed for anything like laceration she would need to go to urgent care as we do not stock the materials needed to do sutures.  Discussed in general we can see people in the office when they are ill as long as we have available appointments.  Discussed use of urgent care if she is ill with an acute illness and we are not able to get her in within a reasonable amount of time.

## 2023-05-12 NOTE — Assessment & Plan Note (Signed)
 Suspect patient's symptoms are related to allergies.  She will continue the over-the-counter allergy pill.  She will add Flonase 2 sprays each nostril twice daily.  If not improving she will let us  know.

## 2023-05-12 NOTE — Assessment & Plan Note (Signed)
 Chronic issue.  Check LDL and hepatic function panel.  She will continue Crestor  5 mg daily.

## 2023-05-12 NOTE — Assessment & Plan Note (Signed)
Chronic issue.  Check A1c.  Continue healthy diet and exercise.
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
## 2023-05-12 NOTE — Assessment & Plan Note (Signed)
 Patient with significant stress.  Anxiety and depression appear to be relatively mild based on PHQ-9 and GAD-7 scores.  Patient is dealing with the stressor that is contributing to this through her church and has a plan in place to involve the police if needed.  She will let us  know if anything progresses to where she feels as though she needs medication or therapy.

## 2023-06-01 DIAGNOSIS — M5416 Radiculopathy, lumbar region: Secondary | ICD-10-CM | POA: Diagnosis not present

## 2023-06-01 DIAGNOSIS — M5136 Other intervertebral disc degeneration, lumbar region with discogenic back pain only: Secondary | ICD-10-CM | POA: Diagnosis not present

## 2023-06-01 DIAGNOSIS — M62830 Muscle spasm of back: Secondary | ICD-10-CM | POA: Diagnosis not present

## 2023-06-01 DIAGNOSIS — M9903 Segmental and somatic dysfunction of lumbar region: Secondary | ICD-10-CM | POA: Diagnosis not present

## 2023-06-15 DIAGNOSIS — H9202 Otalgia, left ear: Secondary | ICD-10-CM | POA: Diagnosis not present

## 2023-06-15 DIAGNOSIS — M26622 Arthralgia of left temporomandibular joint: Secondary | ICD-10-CM | POA: Diagnosis not present

## 2023-07-05 DIAGNOSIS — M9903 Segmental and somatic dysfunction of lumbar region: Secondary | ICD-10-CM | POA: Diagnosis not present

## 2023-07-05 DIAGNOSIS — M5416 Radiculopathy, lumbar region: Secondary | ICD-10-CM | POA: Diagnosis not present

## 2023-07-05 DIAGNOSIS — M62830 Muscle spasm of back: Secondary | ICD-10-CM | POA: Diagnosis not present

## 2023-07-05 DIAGNOSIS — M5136 Other intervertebral disc degeneration, lumbar region with discogenic back pain only: Secondary | ICD-10-CM | POA: Diagnosis not present

## 2023-07-28 ENCOUNTER — Ambulatory Visit (INDEPENDENT_AMBULATORY_CARE_PROVIDER_SITE_OTHER): Payer: Medicare Other | Admitting: *Deleted

## 2023-07-28 VITALS — Ht 64.0 in | Wt 132.0 lb

## 2023-07-28 DIAGNOSIS — Z0000 Encounter for general adult medical examination without abnormal findings: Secondary | ICD-10-CM

## 2023-07-28 NOTE — Progress Notes (Signed)
 Subjective:   Stacey Munoz is a 78 y.o. who presents for a Medicare Wellness preventive visit.  Visit Complete: Virtual I connected with  Alla Feeling on 07/28/23 by a audio enabled telemedicine application and verified that I am speaking with the correct person using two identifiers.  Patient Location: Home  Provider Location: Office/Clinic  I discussed the limitations of evaluation and management by telemedicine. The patient expressed understanding and agreed to proceed.  Vital Signs: Because this visit was a virtual/telehealth visit, some criteria may be missing or patient reported. Any vitals not documented were not able to be obtained and vitals that have been documented are patient reported.  VideoDeclined- This patient declined Librarian, academic. Therefore the visit was completed with audio only.  Persons Participating in Visit: Patient.  AWV Questionnaire: Yes: Patient Medicare AWV questionnaire was completed by the patient on 07/26/23; I have confirmed that all information answered by patient is correct and no changes since this date.  Cardiac Risk Factors include: advanced age (>60men, >41 women);dyslipidemia     Objective:    Today's Vitals   07/28/23 0813  Weight: 132 lb (59.9 kg)  Height: 5\' 4"  (1.626 m)   Body mass index is 22.66 kg/m.     07/28/2023    8:30 AM 02/11/2023    8:10 AM 02/11/2023    8:06 AM 07/27/2022    8:49 AM 09/30/2021   10:35 AM 09/16/2021    7:09 AM 05/20/2021   10:58 AM  Advanced Directives  Does Patient Have a Medical Advance Directive? Yes Yes No Yes Yes Yes Yes  Type of Estate agent of [place];Living will Healthcare Power of [place];Living will  Healthcare Power of [place];Living will Healthcare Power of [place];Living will Healthcare Power of [place];Living will   Does patient want to make changes to medical advance directive? No - Patient declined   No - Patient declined No -  Patient declined No - Patient declined   Copy of Healthcare Power of Attorney in Chart? Yes - validated most recent copy scanned in chart (See row information)   Yes - validated most recent copy scanned in chart (See row information) Yes - validated most recent copy scanned in chart (See row information) Yes - validated most recent copy scanned in chart (See row information)     Current Medications (verified) Outpatient Encounter Medications as of 07/28/2023  Medication Sig   Azelaic Acid 15 % gel Apply to face QHS.   CALCIUM PO Take 1,000 mg by mouth daily at 12 noon.   Cholecalciferol (VITAMIN D3 PO) Take 20 mcg by mouth daily at 12 noon.   Flaxseed Oil OIL Take 30 mLs by mouth daily at 12 noon. 2 tablespoons = 30 mL   fluticasone (FLONASE) 50 MCG/ACT nasal spray Place 1 spray into both nostrils daily.   metroNIDAZOLE (METROCREAM) 0.75 % cream Apply to affected skin qd-bid   rosuvastatin (CRESTOR) 5 MG tablet Take 1 tablet (5 mg total) by mouth daily.   No facility-administered encounter medications on file as of 07/28/2023.    Allergies (verified) Patient has no known allergies.   History: Past Medical History:  Diagnosis Date   COVID-19    11/08/20   Hyperlipidemia    Low serum vitamin D    Osteoporosis    Past Surgical History:  Procedure Laterality Date   CATARACT EXTRACTION W/PHACO Right 09/16/2021   Procedure: CATARACT EXTRACTION PHACO AND INTRAOCULAR LENS PLACEMENT (IOC) RIGHT 3.80 00:36.0;  Surgeon: Galen Manila,  MD;  Location: [facility];  Service: Ophthalmology;  Laterality: Right;  needs to have an AM appointment   CATARACT EXTRACTION W/PHACO Left 09/30/2021   Procedure: CATARACT EXTRACTION PHACO AND INTRAOCULAR LENS PLACEMENT (IOC) LEFT 5.17 00:33.9;  Surgeon: Galen Manila, MD;  Location: [facility];  Service: Ophthalmology;  Laterality: Left;   COLONOSCOPY WITH PROPOFOL N/A 05/20/2021   Procedure: COLONOSCOPY WITH PROPOFOL;  Surgeon: Midge Minium, MD;  Location: [facility];  Service: Endoscopy;  Laterality: N/A;   FACIAL COSMETIC SURGERY     FRACTURE SURGERY     KNEE ARTHROSCOPY Right 01/2011   torn meniscus   ROOT CANAL Left    04/2021 surgery schedule   TRIGGER FINGER RELEASE Right 08/2014   TUBAL LIGATION     WRIST FRACTURE SURGERY     left   Family History  Problem Relation Age of Onset   Cancer Mother        breast cancer   Breast cancer Mother 40   Hypertension Mother    Hyperlipidemia Mother    Diabetes Father    Cancer Father        lymphoma    Heart disease Brother    Clotting disorder Brother        on Coumadin    Kidney disease Maternal Grandmother        died young age when her mother was 69 y.o    Cancer Maternal Grandfather    Heart attack Maternal Grandfather    Cancer Paternal Grandmother        ?breast cancer   Breast cancer Paternal Grandmother        PT think it was cancer; never got check    Other Paternal Grandfather        polio   CAD Son        x 2   Gout Son    CAD Son        cad age 79 y.o   Social History   Socioeconomic History   Marital status: Single    Spouse name: Not on file   Number of children: 3   Years of education: Not on file   Highest education level: Doctorate  Occupational History    Employer: RETIRED  Tobacco Use   Smoking status: Never   Smokeless tobacco: Never   Tobacco comments:    smoking cessation materials not required  Vaping Use   Vaping status: Never Used  Substance and Sexual Activity   Alcohol use: Yes    Alcohol/week: 1.0 standard drink of alcohol    Types: 1 Glasses of wine per week    Comment: rare   Drug use: No   Sexual activity: Not Currently  Other Topics Concern   Not on file  Social History Narrative   [employer] used to work    3 sons    DPR son Halford Chessman [phone]   7 grandchildren 1 boy and 6 girls    Used to horse back ride    Likes exercise silver sneakers, massage 1 x per month, yoga walks 2-3 miles  daily and does weight lifting    Never smoker    Social Drivers of Corporate investment banker Strain: Low Risk  (07/28/2023)   Overall Financial Resource Strain (CARDIA)    Difficulty of Paying Living Expenses: Not hard at all  Food Insecurity: No Food Insecurity (07/28/2023)   Hunger Vital Sign    Worried About Running Out of  Food in the Last Year: Never true    Ran Out of Food in the Last Year: Never true  Transportation Needs: No Transportation Needs (07/28/2023)   PRAPARE - Administrator, Civil Service (Medical): No    Lack of Transportation (Non-Medical): No  Physical Activity: Sufficiently Active (07/28/2023)   Exercise Vital Sign    Days of Exercise per Week: 7 days    Minutes of Exercise per Session: 60 min  Stress: No Stress Concern Present (07/28/2023)   [employer] of Occupational Health - Occupational Stress Questionnaire    Feeling of Stress : Only a little  Social Connections: Moderately Integrated (07/28/2023)   Social Connection and Isolation Panel [NHANES]    Frequency of Communication with Friends and Family: More than three times a week    Frequency of Social Gatherings with Friends and Family: More than three times a week    Attends Religious Services: More than 4 times per year    Active Member of [employer] or Organizations: Yes    Attends Engineer, structural: More than 4 times per year    Marital Status: Divorced    Tobacco Counseling Counseling given: Not Answered Tobacco comments: smoking cessation materials not required    Clinical Intake:  Pre-visit preparation completed: Yes  Pain : No/denies pain     BMI - recorded: 22.66 Nutritional Status: BMI of 19-24  Normal Nutritional Risks: None Diabetes: No  Lab Results  Component Value Date   HGBA1C 6.0 05/12/2023   HGBA1C 6.0 11/11/2022   HGBA1C 5.9 05/08/2022     How often do you need to have someone help you when you read instructions, pamphlets, or other written materials  from your doctor or pharmacy?: 1 - Never  Interpreter Needed?: No  Information entered by :: R. Tyisha Cressy LPN   Activities of Daily Living     07/26/2023    8:03 PM  In your present state of health, do you have any difficulty performing the following activities:  Hearing? 1  Comment wears aids  Vision? 0  Difficulty concentrating or making decisions? 0  Walking or climbing stairs? 0  Dressing or bathing? 0  Doing errands, shopping? 0  Preparing Food and eating ? N  Using the Toilet? N  In the past six months, have you accidently leaked urine? N  Do you have problems with loss of bowel control? N  Managing your Medications? N  Managing your Finances? N  Housekeeping or managing your Housekeeping? N    Patient Care Team: Glori Luis, MD (Inactive) as PCP - General (Family Medicine) Galen Manila, MD as Referring Physician (Ophthalmology)  Indicate any recent Medical Services you may have received from other than [facility] providers in the past year (date may be approximate).     Assessment:   This is a routine wellness examination for Juanelle.  Hearing/Vision screen Hearing Screening - Comments:: Wears aids Vision Screening - Comments:: readers   Goals Addressed             This Visit's Progress    Patient Stated       Patient Stated       Wants to eat healthy and frequent exercise       Depression Screen     07/28/2023    8:21 AM 05/12/2023    9:43 AM 01/07/2023    9:20 AM 07/27/2022    8:42 AM 05/13/2022    9:30 AM 02/19/2022   10:25 AM 11/06/2021  2:05 PM  PHQ 2/9 Scores  PHQ - 2 Score 0 1 0 0 0 0 0  PHQ- 9 Score 1 3 0        Fall Risk     07/26/2023    8:03 PM 05/12/2023    9:43 AM 01/07/2023    9:20 AM 07/23/2022    9:05 PM 05/13/2022    9:30 AM  Fall Risk   Falls in the past year? 0 0 0 0 0  Number falls in past yr: 0 0 0 0 0  Injury with Fall? 0 0 0 0 0  Risk for fall due to : No Fall Risks No Fall Risks No Fall Risks  No Fall Risks  Follow  up Falls prevention discussed;Falls evaluation completed  Falls evaluation completed Falls evaluation completed;Falls prevention discussed Falls evaluation completed    MEDICARE RISK AT HOME:  Medicare Risk at Home Any stairs in or around the home?: (Patient-Rptd) No If so, are there any without handrails?: No Home free of loose throw rugs in walkways, pet beds, electrical cords, etc?: (Patient-Rptd) Yes Adequate lighting in your home to reduce risk of falls?: (Patient-Rptd) Yes Life alert?: (Patient-Rptd) No Use of a cane, walker or w/c?: (Patient-Rptd) No Grab bars in the bathroom?: (Patient-Rptd) Yes Shower chair or bench in shower?: (Patient-Rptd) Yes Elevated toilet seat or a handicapped toilet?: (Patient-Rptd) No  TIMED UP AND GO:  Was the test performed?  No  Cognitive Function: 6CIT completed        07/28/2023    8:30 AM 07/27/2022    9:01 AM 05/19/2018   10:20 AM 05/11/2016    9:45 AM  6CIT Screen  What Year? 0 points 0 points 0 points 0 points  What month? 0 points 0 points 0 points 0 points  What time? 0 points 0 points 0 points 0 points  Count back from 20 0 points 0 points 0 points 0 points  Months in reverse 0 points 0 points 0 points 0 points  Repeat phrase 0 points 0 points 0 points 0 points  Total Score 0 points 0 points 0 points 0 points    Immunizations Immunization History  Administered Date(s) Administered   Encephalitis 05/14/2009, 05/20/2009, 06/10/2009   Fluad Quad(high Dose 65+) 01/22/2021, 02/19/2022   Fluad Trivalent(High Dose 65+) 03/12/2023   Hepatitis A 05/10/2009, 10/23/2009   Hepatitis A, Adult 11/05/2016, 05/31/2017   Hepatitis B 05/10/2009, 06/17/2009, 10/23/2009   IPV 09/09/2007   Influenza, High Dose Seasonal PF 04/03/2016, 02/08/2017, 02/03/2018, 12/27/2019, 01/22/2021   Influenza,inj,Quad PF,6+ Mos 01/17/2019   Influenza-Unspecified 06/03/2009, 07/22/2010, 01/25/2014   MMR 09/09/2007   Moderna SARS-COV2 Booster Vaccination  02/23/2020, 08/30/2020   Moderna Sars-Covid-2 Vaccination 05/31/2019, 06/30/2019, 01/08/2023   PNEUMOCOCCAL CONJUGATE-20 02/19/2022   Pfizer Covid-19 Vaccine Bivalent Booster 6yrs & up 02/03/2021   Pfizer Fall 2024 Covid-19 Vaccine 67yrs thru 63yrs. 01/17/2022   Pneumococcal Conjugate-13 05/11/2016   Pneumococcal Polysaccharide-23 01/16/2013   RSV,unspecified 02/19/2022   Rabies, IM 05/08/2009, 05/14/2009, 05/29/2009   Respiratory Syncytial Virus Vaccine,Recomb Aduvanted(Arexvy) 03/24/2022   Td 06/03/2011, 05/14/2022   Tdap 06/02/2009, 05/12/2021   Typhoid Parenteral, AKD ([place] Military) 05/20/2009   Zoster Recombinant(Shingrix) 06/19/2018, 07/11/2018, 11/03/2018    Screening Tests Health Maintenance  Topic Date Due   COVID-19 Vaccine (5 - 2024-25 season) 03/05/2023   MAMMOGRAM  10/14/2023   INFLUENZA VACCINE  11/26/2023   Medicare Annual Wellness (AWV)  07/27/2024   DTaP/Tdap/Td (5 - Td or Tdap) 05/14/2032   Pneumonia Vaccine  24+ Years old  Completed   DEXA SCAN  Completed   Hepatitis C Screening  Completed   Zoster Vaccines- Shingrix  Completed   HPV VACCINES  Aged Out   Colonoscopy  Discontinued    Health Maintenance  Health Maintenance Due  Topic Date Due   COVID-19 Vaccine (5 - 2024-25 season) 03/05/2023   Health Maintenance Items Addressed: Patient prefers to discontinue mammograms due to age.   Additional Screening:  Vision Screening: Recommended annual ophthalmology exams for early detection of glaucoma and other disorders of the eye. Up to date [facility]  Dental Screening: Recommended annual dental exams for proper oral hygiene  Community Resource Referral / Chronic Care Management: CRR required this visit?  No   CCM required this visit?  No     Plan:     I have personally reviewed and noted the following in the patient's chart:   Medical and social history Use of alcohol, tobacco or illicit drugs  Current medications and supplements including  opioid prescriptions. Patient is not currently taking opioid prescriptions. Functional ability and status Nutritional status Physical activity Advanced directives List of other physicians Hospitalizations, surgeries, and ER visits in previous 12 months Vitals Screenings to include cognitive, depression, and falls Referrals and appointments  In addition, I have reviewed and discussed with patient certain preventive protocols, quality metrics, and best practice recommendations. A written personalized care plan for preventive services as well as general preventive health recommendations were provided to patient.     Sydell Axon, LPN   05/02/1094   After Visit Summary: (MyChart) Due to this being a telephonic visit, the after visit summary with patients personalized plan was offered to patient via MyChart   Notes: Nothing significant to report at this time.

## 2023-07-28 NOTE — Patient Instructions (Signed)
 Stacey Munoz , Thank you for taking time to come for your Medicare Wellness Visit. I appreciate your ongoing commitment to your health goals. Please review the following plan we discussed and let me know if I can assist you in the future.   Referrals/Orders/Follow-Ups/Clinician Recommendations: None  This is a list of the screening recommended for you and due dates:  Health Maintenance  Topic Date Due   COVID-19 Vaccine (5 - 2024-25 season) 03/05/2023   Mammogram  10/14/2023   Flu Shot  11/26/2023   Medicare Annual Wellness Visit  07/27/2024   DTaP/Tdap/Td vaccine (5 - Td or Tdap) 05/14/2032   Pneumonia Vaccine  Completed   DEXA scan (bone density measurement)  Completed   Hepatitis C Screening  Completed   Zoster (Shingles) Vaccine  Completed   HPV Vaccine  Aged Out   Colon Cancer Screening  Discontinued    Advanced directives: (In Chart) A copy of your advanced directives are scanned into your chart should your provider ever need it.  Next Medicare Annual Wellness Visit scheduled for next year: Yes 08/02/24 @ 8:50

## 2023-08-03 DIAGNOSIS — M5416 Radiculopathy, lumbar region: Secondary | ICD-10-CM | POA: Diagnosis not present

## 2023-08-03 DIAGNOSIS — M9903 Segmental and somatic dysfunction of lumbar region: Secondary | ICD-10-CM | POA: Diagnosis not present

## 2023-08-03 DIAGNOSIS — M5136 Other intervertebral disc degeneration, lumbar region with discogenic back pain only: Secondary | ICD-10-CM | POA: Diagnosis not present

## 2023-08-03 DIAGNOSIS — M62830 Muscle spasm of back: Secondary | ICD-10-CM | POA: Diagnosis not present

## 2023-08-06 ENCOUNTER — Telehealth: Payer: Self-pay | Admitting: Family Medicine

## 2023-08-06 ENCOUNTER — Other Ambulatory Visit: Payer: Self-pay

## 2023-08-06 MED ORDER — ROSUVASTATIN CALCIUM 5 MG PO TABS: 5.0000 mg | ORAL_TABLET | Freq: Every day | ORAL | 3 refills | Status: AC

## 2023-08-06 NOTE — Telephone Encounter (Signed)
 Refill has been sent to local pharmacy for Rosuvastatin prescription to get patient to scheduled appointment in July with Rennie Plowman.

## 2023-08-06 NOTE — Telephone Encounter (Signed)
 Prescription Request  08/06/2023  LOV: 05/12/2023  What is the name of the medication or equipment?  rosuvastatin (CRESTOR) 5 MG tablet   Have you contacted your pharmacy to request a refill? No   Which pharmacy would you like this sent to?  [id number] [address] [place] [address] Phone: [phone] Fax: [phone]  [facility] - [place], [place] - [address] [place] [address] Phone: [phone] Fax: [phone]  [facility] - [place], [place] - [place] [address] [place] [address] Phone: [phone] Fax: [phone]    Patient notified that their request is being sent to the clinical staff for review and that they should receive a response within 2 business days.   Please advise at Mobile [phone] (mobile)   Pt has about a week and a half of pills left

## 2023-08-31 DIAGNOSIS — M9903 Segmental and somatic dysfunction of lumbar region: Secondary | ICD-10-CM | POA: Diagnosis not present

## 2023-08-31 DIAGNOSIS — M62830 Muscle spasm of back: Secondary | ICD-10-CM | POA: Diagnosis not present

## 2023-08-31 DIAGNOSIS — M5136 Other intervertebral disc degeneration, lumbar region with discogenic back pain only: Secondary | ICD-10-CM | POA: Diagnosis not present

## 2023-08-31 DIAGNOSIS — M5416 Radiculopathy, lumbar region: Secondary | ICD-10-CM | POA: Diagnosis not present

## 2023-09-01 ENCOUNTER — Ambulatory Visit: Payer: Medicare Other | Admitting: Dermatology

## 2023-09-01 ENCOUNTER — Encounter: Payer: Self-pay | Admitting: Dermatology

## 2023-09-01 DIAGNOSIS — L578 Other skin changes due to chronic exposure to nonionizing radiation: Secondary | ICD-10-CM

## 2023-09-01 DIAGNOSIS — Z5111 Encounter for antineoplastic chemotherapy: Secondary | ICD-10-CM

## 2023-09-01 DIAGNOSIS — L821 Other seborrheic keratosis: Secondary | ICD-10-CM

## 2023-09-01 DIAGNOSIS — W908XXA Exposure to other nonionizing radiation, initial encounter: Secondary | ICD-10-CM

## 2023-09-01 DIAGNOSIS — Z7189 Other specified counseling: Secondary | ICD-10-CM

## 2023-09-01 DIAGNOSIS — L233 Allergic contact dermatitis due to drugs in contact with skin: Secondary | ICD-10-CM

## 2023-09-01 DIAGNOSIS — L570 Actinic keratosis: Secondary | ICD-10-CM | POA: Diagnosis not present

## 2023-09-01 DIAGNOSIS — Z872 Personal history of diseases of the skin and subcutaneous tissue: Secondary | ICD-10-CM

## 2023-09-01 DIAGNOSIS — L719 Rosacea, unspecified: Secondary | ICD-10-CM

## 2023-09-01 DIAGNOSIS — Z79899 Other long term (current) drug therapy: Secondary | ICD-10-CM

## 2023-09-01 MED ORDER — FLUOROURACIL 5 % EX CREA: TOPICAL_CREAM | Freq: Two times a day (BID) | CUTANEOUS | 1 refills | Status: AC

## 2023-09-01 NOTE — Progress Notes (Signed)
 Follow-Up Visit   Subjective  Stacey Munoz is a 78 y.o. female who presents for the following: Rosacea face, 1 yr f/u, pt using Olay products, she thinks using Cerave and Cetaphil products in past is what caused flares, used Cetaphil lotion on arms yesterday and arms started itching and she had to wash arms 3 times, Itching on back prn thinks may be related to bra The patient has spots, moles and lesions to be evaluated, some may be new or changing and the patient may have concern these could be cancer.  The following portions of the chart were reviewed this encounter and updated as appropriate: medications, allergies, medical history  Review of Systems:  No other skin or systemic complaints except as noted in HPI or Assessment and Plan.  Objective  Well appearing patient in no apparent distress; mood and affect are within normal limits.   A focused examination was performed of the following areas: face  Relevant exam findings are noted in the Assessment and Plan.  R chest x 2, R cheek x 1, R nose x 1 (4) Pink scaly macules   Assessment & Plan   ROSACEA face Exam Mid face erythema with telangiectasias  Chronic condition with duration or expected duration over one year. Currently well-controlled. Rosacea is a chronic progressive skin condition usually affecting the face of adults, causing redness and/or acne bumps. It is treatable but not curable. It sometimes affects the eyes (ocular rosacea) as well. It may respond to topical and/or systemic medication and can flare with stress, sun exposure, alcohol, exercise, topical steroids (including hydrocortisone/cortisone 10) and some foods.  Daily application of broad spectrum spf 30+ sunscreen to face is recommended to reduce flares.  Patient denies grittiness of the eyes  Treatment Plan No treatment at this time, controlled since d/c Cetaphil/Cerave products  SEBORRHEIC KERATOSIS Back  - Stuck-on, waxy, tan-brown papules  and/or plaques  - Benign-appearing - Discussed benign etiology and prognosis. - Observe - Call for any changes  HISTORY OF DERMAL HYPERSENSITIVITY Bx proven 05/18/22 Could this be Contact Dermatitis to Cetaphil and CeraVe moisturizers? Back Progress note from Dr. Almeda Jacobs 06/04/2022 viewed, all allergens tested were negative on both epicutaneous and intradermal level, all foods tested were negative Exam: back clear today Treatment Plan: Recommend TRUE PATCH TEST 36, will schedule patient  AK (ACTINIC KERATOSIS) (4) R chest x 2, R cheek x 1, R nose x 1 (4) Actinic keratoses are precancerous spots that appear secondary to cumulative UV radiation exposure/sun exposure over time. They are chronic with expected duration over 1 year. A portion of actinic keratoses will progress to squamous cell carcinoma of the skin. It is not possible to reliably predict which spots will progress to skin cancer and so treatment is recommended to prevent development of skin cancer.  Recommend daily broad spectrum sunscreen SPF 30+ to sun-exposed areas, reapply every 2 hours as needed.  Recommend staying in the shade or wearing long sleeves, sun glasses (UVA+UVB protection) and wide brim hats (4-inch brim around the entire circumference of the hat). Call for new or changing lesions. Destruction of lesion - R chest x 2, R cheek x 1, R nose x 1 (4) Complexity: simple   Destruction method: cryotherapy   Informed consent: discussed and consent obtained   Timeout:  patient name, date of birth, surgical site, and procedure verified Lesion destroyed using liquid nitrogen: Yes   Region frozen until ice ball extended beyond lesion: Yes   Outcome: patient tolerated procedure well  with no complications   Post-procedure details: wound care instructions given    ACTINIC DAMAGE WITH PRECANCEROUS ACTINIC KERATOSES Counseling for Topical Chemotherapy Management: Patient exhibits: - Severe, confluent actinic changes with  pre-cancerous actinic keratoses that is secondary to cumulative UV radiation exposure over time - Condition that is severe; chronic, not at goal. - diffuse scaly erythematous macules and papules with underlying dyspigmentation - Discussed Prescription "Field Treatment" topical Chemotherapy for Severe, Chronic Confluent Actinic Changes with Pre-Cancerous Actinic Keratoses Field treatment involves treatment of an entire area of skin that has confluent Actinic Changes (Sun/ Ultraviolet light damage) and PreCancerous Actinic Keratoses by method of PhotoDynamic Therapy (PDT) and/or prescription Topical Chemotherapy agents such as 5-fluorouracil, 5-fluorouracil/calcipotriene, and/or imiquimod.  The purpose is to decrease the number of clinically evident and subclinical PreCancerous lesions to prevent progression to development of skin cancer by chemically destroying early precancer changes that may or may not be visible.  It has been shown to reduce the risk of developing skin cancer in the treated area. As a result of treatment, redness, scaling, crusting, and open sores may occur during treatment course. One or more than one of these methods may be used and may have to be used several times to control, suppress and eliminate the PreCancerous changes. Discussed treatment course, expected reaction, and possible side effects. - Recommend daily broad spectrum sunscreen SPF 30+ to sun-exposed areas, reapply every 2 hours as needed.  - Staying in the shade or wearing long sleeves, sun glasses (UVA+UVB protection) and wide brim hats (4-inch brim around the entire circumference of the hat) are also recommended. - Call for new or changing lesions.  - In 4-6 weeks - Start 5-fluorouracil/calcipotriene cream twice a day for 10 days to affected areas including right nose, right cheek, right chest. Prescription sent to [facility]. Patient advised they will receive an email to purchase the medication  online and have it sent to their home. Patient provided with handout reviewing treatment course and side effects and advised to call or message us  on MyChart with any concerns.  Reviewed course of treatment and expected reaction.  Patient advised to expect inflammation and crusting and advised that erosions are possible.  Patient advised to be diligent with sun protection during and after treatment. Counseled to keep medication out of reach of children and pets.  Return for schedule patch test 2 visits with nurse and 1 wk f/u with Dr. Bary Likes.  I, Rollie Clipper, RMA, am acting as scribe for Celine Collard, MD .   Documentation: I have reviewed the above documentation for accuracy and completeness, and I agree with the above.  Celine Collard, MD

## 2023-09-01 NOTE — Patient Instructions (Addendum)
 - In 4-6 weeks - Start 5-fluorouracil/calcipotriene cream twice a day for 10 days to affected areas including right nose, right cheek, right chest. Prescription sent to [facility]. Patient advised they will receive an email to purchase the medication online and have it sent to their home. Patient provided with handout reviewing treatment course and side effects and advised to call or message us  on MyChart with any concerns.  Instructions for [facility]  One or more of your medications was sent to the [facility]. You will receive an email from them and can purchase the medicine through that link. It will then be mailed to your home at the address you confirmed. If for any reason you do not receive an email from them, please check your spam folder. If you still do not find the email, please let us  know. [facility] phone number is [phone].   5-Fluorouracil/Calcipotriene Patient Education   Actinic keratoses are the dry, red scaly spots on the skin caused by sun damage. A portion of these spots can turn into skin cancer with time, and treating them can help prevent development of skin cancer.   Treatment of these spots requires removal of the defective skin cells. There are various ways to remove actinic keratoses, including freezing with liquid nitrogen, treatment with creams, or treatment with a blue light procedure in the office.   5-fluorouracil cream is a topical cream used to treat actinic keratoses. It works by interfering with the growth of abnormal fast-growing skin cells, such as actinic keratoses. These cells peel off and are replaced by healthy ones.   5-fluorouracil/calcipotriene is a combination of the 5-fluorouracil cream with a vitamin D  analog cream called calcipotriene. The calcipotriene alone does not treat actinic keratoses. However, when it is combined with 5-fluorouracil, it helps the  5-fluorouracil treat the actinic keratoses much faster so that the same results can be achieved with a much shorter treatment time.  INSTRUCTIONS FOR 5-FLUOROURACIL/CALCIPOTRIENE CREAM:   5-fluorouracil/calcipotriene cream typically only needs to be used for 4-7 days. A thin layer should be applied twice a day to the treatment areas recommended by your physician.   If your physician prescribed you separate tubes of 5-fluourouracil and calcipotriene, apply a thin layer of 5-fluorouracil followed by a thin layer of calcipotriene.   Avoid contact with your eyes, nostrils, and mouth. Do not use 5-fluorouracil/calcipotriene cream on infected or open wounds.   You will develop redness, irritation and some crusting at areas where you have pre-cancer damage/actinic keratoses. IF YOU DEVELOP PAIN, BLEEDING, OR SIGNIFICANT CRUSTING, STOP THE TREATMENT EARLY - you have already gotten a good response and the actinic keratoses should clear up well.  Wash your hands after applying 5-fluorouracil 5% cream on your skin.   A moisturizer or sunscreen with a minimum SPF 30 should be applied each morning.   Once you have finished the treatment, you can apply a thin layer of Vaseline twice a day to irritated areas to soothe and calm the areas more quickly. If you experience significant discomfort, contact your physician.  For some patients it is necessary to repeat the treatment for best results.  SIDE EFFECTS: When using 5-fluorouracil/calcipotriene cream, you may have mild irritation, such as redness, dryness, swelling, or a mild burning sensation. This usually resolves within 2 weeks. The more actinic keratoses you have, the more redness and inflammation you can expect during treatment. Eye irritation has been reported rarely. If this occurs, please let  us  know.  If you have any trouble using this cream, please call the office. If you have any other questions about this information, please do not hesitate to ask  me before you leave the office.  Cryotherapy Aftercare  Wash gently with soap and water everyday.   Apply Vaseline and Band-Aid daily until healed.      Due to recent changes in healthcare laws, you may see results of your pathology and/or laboratory studies on MyChart before the doctors have had a chance to review them. We understand that in some cases there may be results that are confusing or concerning to you. Please understand that not all results are received at the same time and often the doctors may need to interpret multiple results in order to provide you with the best plan of care or course of treatment. Therefore, we ask that you please give us  2 business days to thoroughly review all your results before contacting the office for clarification. Should we see a critical lab result, you will be contacted sooner.   If You Need Anything After Your Visit  If you have any questions or concerns for your doctor, please call our main line at [phone] and press option 4 to reach your doctor's medical assistant. If no one answers, please leave a voicemail as directed and we will return your call as soon as possible. Messages left after 4 pm will be answered the following business day.   You may also send us  a message via MyChart. We typically respond to MyChart messages within 1-2 business days.  For prescription refills, please ask your pharmacy to contact our office. Our fax number is [phone].  If you have an urgent issue when the clinic is closed that cannot wait until the next business day, you can page your doctor at the number below.    Please note that while we do our best to be available for urgent issues outside of office hours, we are not available 24/7.   If you have an urgent issue and are unable to reach us , you may choose to seek medical care at your doctor's office, retail clinic, urgent care center, or emergency room.  If you have a medical emergency, please  immediately call 911 or go to the emergency department.  Pager Numbers  - Dr. Bary Likes: [phone]  - Dr. Annette Barters: [phone]  - Dr. Felipe Horton: [phone]   In the event of inclement weather, please call our main line at [phone] for an update on the status of any delays or closures.  Dermatology Medication Tips: Please keep the boxes that topical medications come in in order to help keep track of the instructions about where and how to use these. Pharmacies typically print the medication instructions only on the boxes and not directly on the medication tubes.   If your medication is too expensive, please contact our office at [phone] option 4 or send us  a message through MyChart.   We are unable to tell what your co-pay for medications will be in advance as this is different depending on your insurance coverage. However, we may be able to find a substitute medication at lower cost or fill out paperwork to get insurance to cover a needed medication.   If a prior authorization is required to get your medication covered by your insurance company, please allow us  1-2 business days to complete this process.  Drug prices often vary depending on where the prescription is filled and some pharmacies may offer  cheaper prices.  The website www.goodrx.com contains coupons for medications through different pharmacies. The prices here do not account for what the cost may be with help from insurance (it may be cheaper with your insurance), but the website can give you the price if you did not use any insurance.  - You can print the associated coupon and take it with your prescription to the pharmacy.  - You may also stop by our office during regular business hours and pick up a GoodRx coupon card.  - If you need your prescription sent electronically to a different pharmacy, notify our office through [facility] or by phone at [phone] option 4.     Si Usted Necesita Algo  Despus de Su Visita  Tambin puede enviarnos un mensaje a travs de Clinical cytogeneticist. Por lo general respondemos a los mensajes de MyChart en el transcurso de 1 a 2 das hbiles.  Para renovar recetas, por favor pida a su farmacia que se ponga en contacto con nuestra oficina. Franz Jacks de fax es [place] [phone].  Si tiene un asunto urgente cuando la clnica est cerrada y que no puede esperar hasta el siguiente da hbil, puede llamar/localizar a su doctor(a) al nmero que aparece a continuacin.   Por favor, tenga en cuenta que aunque hacemos todo lo posible para estar disponibles para asuntos urgentes fuera del horario de [place], no estamos disponibles las 24 horas del da, los 7 [address]  [address] de la [place].   Si tiene un problema urgente y no puede comunicarse con nosotros, puede optar por buscar atencin mdica  en el consultorio de su doctor(a), en una clnica privada, en un centro de atencin urgente o en una sala de emergencias.  Si tiene Engineer, drilling, por favor llame inmediatamente al 911 o vaya a la sala de emergencias.  Nmeros de bper  - Dr. Bary Likes: [phone]  - Dra. Annette Barters: [phone]  - Dr. Felipe Horton: [phone]   En caso de inclemencias del tiempo, por favor llame a Lajuan Pila principal al [phone] para una actualizacin sobre el [place] de cualquier retraso o cierre.  Consejos para la medicacin en dermatologa: Por favor, guarde las cajas en las que vienen los medicamentos de uso tpico para ayudarle a seguir las instrucciones sobre dnde y cmo usarlos. Las farmacias generalmente imprimen las instrucciones del medicamento slo en las cajas y no directamente en los tubos del [place].   Si su medicamento es muy caro, por favor, pngase en contacto con Bettyjane Brunet llamando al [phone] y presione la opcin 4 o envenos un mensaje a travs de Clinical cytogeneticist.   No podemos decirle cul ser su copago por los medicamentos por adelantado ya que esto es diferente  dependiendo de la cobertura de su seguro. Sin embargo, es posible que podamos encontrar un medicamento sustituto a Audiological scientist un formulario para que el seguro cubra el medicamento que se considera necesario.   Si se requiere una autorizacin previa para que su compaa de seguros [place] su medicamento, por favor permtanos de 1 a 2 das hbiles para completar este proceso.  Los precios de los medicamentos varan con frecuencia dependiendo del Environmental consultant de dnde se surte la receta y alguna farmacias pueden ofrecer precios ms baratos.  El sitio web www.goodrx.com tiene cupones para medicamentos de Health and safety inspector. Los precios aqu no tienen en cuenta lo que podra costar con la ayuda del seguro (puede ser ms barato con su seguro), pero el sitio web puede darle el precio si no Dance movement psychotherapist  ningn seguro.  - Puede imprimir el cupn correspondiente y llevarlo con su receta a la farmacia.  - Tambin puede pasar por nuestra oficina durante el horario de atencin regular y Education officer, museum una tarjeta de cupones de GoodRx.  - Si necesita que su receta se enve electrnicamente a una farmacia diferente, informe a nuestra oficina a travs de MyChart de [place] o por telfono llamando al [phone] y presione la opcin 4.

## 2023-09-21 ENCOUNTER — Ambulatory Visit

## 2023-09-21 DIAGNOSIS — R21 Rash and other nonspecific skin eruption: Secondary | ICD-10-CM | POA: Diagnosis not present

## 2023-09-21 NOTE — Progress Notes (Signed)
 Patch testing was performed on the back using standard technique. True test x 36 applied to back at visit today. Pt advised to keep panels dry until removal in 2 days for first read. Mara Seminole CMA, AAMA

## 2023-09-23 ENCOUNTER — Ambulatory Visit

## 2023-09-23 DIAGNOSIS — R21 Rash and other nonspecific skin eruption: Secondary | ICD-10-CM

## 2023-09-23 NOTE — Progress Notes (Signed)
 Stacey Munoz here today for day 3 patch test reading. Stacey Munoz did have a reaction on site #14. The area was not red but could fell swelling. Photos taken. Stacey Munoz advised do not soak nor scrub testing site. She was shown how to read panels over the weekend until follow up with Dr. Bary Likes.  All questions answered.   Lisbeth Rides, RMA

## 2023-09-28 ENCOUNTER — Ambulatory Visit: Admitting: Dermatology

## 2023-09-28 ENCOUNTER — Encounter: Payer: Self-pay | Admitting: Dermatology

## 2023-09-28 DIAGNOSIS — L570 Actinic keratosis: Secondary | ICD-10-CM

## 2023-09-28 DIAGNOSIS — W908XXA Exposure to other nonionizing radiation, initial encounter: Secondary | ICD-10-CM

## 2023-09-28 DIAGNOSIS — L578 Other skin changes due to chronic exposure to nonionizing radiation: Secondary | ICD-10-CM

## 2023-09-28 DIAGNOSIS — L233 Allergic contact dermatitis due to drugs in contact with skin: Secondary | ICD-10-CM

## 2023-09-28 DIAGNOSIS — Z7189 Other specified counseling: Secondary | ICD-10-CM

## 2023-09-28 DIAGNOSIS — Z79899 Other long term (current) drug therapy: Secondary | ICD-10-CM

## 2023-09-28 DIAGNOSIS — Z5111 Encounter for antineoplastic chemotherapy: Secondary | ICD-10-CM

## 2023-09-28 DIAGNOSIS — L231 Allergic contact dermatitis due to adhesives: Secondary | ICD-10-CM

## 2023-09-28 DIAGNOSIS — M62830 Muscle spasm of back: Secondary | ICD-10-CM | POA: Diagnosis not present

## 2023-09-28 DIAGNOSIS — M9903 Segmental and somatic dysfunction of lumbar region: Secondary | ICD-10-CM | POA: Diagnosis not present

## 2023-09-28 DIAGNOSIS — M5136 Other intervertebral disc degeneration, lumbar region with discogenic back pain only: Secondary | ICD-10-CM | POA: Diagnosis not present

## 2023-09-28 DIAGNOSIS — M5416 Radiculopathy, lumbar region: Secondary | ICD-10-CM | POA: Diagnosis not present

## 2023-09-28 DIAGNOSIS — Z872 Personal history of diseases of the skin and subcutaneous tissue: Secondary | ICD-10-CM

## 2023-09-28 NOTE — Progress Notes (Signed)
 Follow-Up Visit   Subjective  Stacey Munoz is a 78 y.o. female who presents for the following: 1 wk f/u patch testing at 3 day f/u pt had reaction to #14 Epoxy Resin, pt noticed reaction to #7, 14, 13, pt has had dental implants ~1.5 yrs ago The patient has spots, moles and lesions to be evaluated, some may be new or changing and the patient may have concern these could be cancer.  The following portions of the chart were reviewed this encounter and updated as appropriate: medications, allergies, medical history  Review of Systems:  No other skin or systemic complaints except as noted in HPI or Assessment and Plan.  Objective  Well appearing patient in no apparent distress; mood and affect are within normal limits.  A focused examination was performed of the following areas: back  Relevant exam findings are noted in the Assessment and Plan.   Assessment & Plan   HISTORY OF DERMAL HYPERSENSITIVITY Bx proven 05/18/22 Could this be Contact Dermatitis to Cetaphil and CeraVe moisturizers? Back Progress note from Dr. Almeda Jacobs 06/04/2022 viewed, all allergens tested were negative on both epicutaneous and intradermal level, all foods tested were negative Exam:   T.R.U.E. Test - 09/28/23 0900       Test Information   Manufacturer Other    Location Back    Number of Test 36    Reading Interval Day 8      Panel 1   1. Nickel Sulfate 0    2. Wool Alcohols 0    3. Neomycin Sulfate 0    4. Potassium Dichromate 0    5. Caine Mix 0    6. Fragrance Mix 0    7. Colophony 0    8. Paraben Mix 0    9. Negative Control 0    10. Balsam of [place] 0    11. Ethylenediamine Dihydrochloride 0    12. Cobalt Dichloride 0      Panel 2   13. p-tert Butylphenol Formaldehyde Resin 0    14. Epoxy Resin 2    15. Carba Mix 0    16.  Black Rubber Mix 0    17. Cl+ Me-Isothiazolinone 0    18. Quaternium-15 0    19. Methyldibromo Glutaronitrile 0    20. p-Phenylenediamine 0    21. Formaldehyde 0     22. Mercapto Mix 0    23. Thimerosal 0    24. Thiuram Mix 0      Panel 3   25. Diazolidinyl Urea 0    26. Quinoline Mix 0    27. Tixocortol-21-Pivalate 0    28. Gold Sodium Thiosulfate 0    29. Imidazolidinyl Urea 0    30. Budesonide 0    31. Hydrocortisone-17-Butyrate 0    32. Mercaptobenzothiazole 0    33. Bacitracin 0    34. Parthenolide 0    35. Disperse Blue 106 0    36. 2-Bromo-2-Nitropropane-1,3-diol 0            Treatment: Information on Epoxy Resin given to patient and discussed with her Discussed expanded patch testing at [facility] with Dr Sueanne Emerald, may consider referral in future  ACTINIC DAMAGE WITH PRECANCEROUS ACTINIC KERATOSES Some pinkness at R cheek, R nose and R chest Counseling for Topical Chemotherapy Management: Patient exhibits: - Severe, confluent actinic changes with pre-cancerous actinic keratoses that is secondary to cumulative UV radiation exposure over time - Condition that is severe; chronic, not at goal. - diffuse scaly erythematous  macules and papules with underlying dyspigmentation - Discussed Prescription "Field Treatment" topical Chemotherapy for Severe, Chronic Confluent Actinic Changes with Pre-Cancerous Actinic Keratoses Field treatment involves treatment of an entire area of skin that has confluent Actinic Changes (Sun/ Ultraviolet light damage) and PreCancerous Actinic Keratoses by method of PhotoDynamic Therapy (PDT) and/or prescription Topical Chemotherapy agents such as 5-fluorouracil , 5-fluorouracil /calcipotriene, and/or imiquimod.  The purpose is to decrease the number of clinically evident and subclinical PreCancerous lesions to prevent progression to development of skin cancer by chemically destroying early precancer changes that may or may not be visible.  It has been shown to reduce the risk of developing skin cancer in the treated area. As a result of treatment, redness, scaling, crusting, and open sores may occur during treatment course.  One or more than one of these methods may be used and may have to be used several times to control, suppress and eliminate the PreCancerous changes. Discussed treatment course, expected reaction, and possible side effects. - Recommend daily broad spectrum sunscreen SPF 30+ to sun-exposed areas, reapply every 2 hours as needed.  - Staying in the shade or wearing long sleeves, sun glasses (UVA+UVB protection) and wide brim hats (4-inch brim around the entire circumference of the hat) are also recommended. - Call for new or changing lesions.  - Continue  5-fluorouracil /calcipotriene cream twice a day for total of 10 days to affected areas including right nose, right cheek, right chest   ALLERGIC CONTACT DERMATITIS DUE TO DRUGS IN CONTACT WITH SKIN   Related Procedures Patch Test  Return in about 9 months (around 06/27/2024) for TBSE, AK f/u.  I, Rollie Clipper, RMA, am acting as scribe for Celine Collard, MD .   Documentation: I have reviewed the above documentation for accuracy and completeness, and I agree with the above.  Celine Collard, MD

## 2023-09-28 NOTE — Patient Instructions (Signed)

## 2023-11-09 ENCOUNTER — Ambulatory Visit (INDEPENDENT_AMBULATORY_CARE_PROVIDER_SITE_OTHER): Payer: Medicare Other | Admitting: Family

## 2023-11-09 ENCOUNTER — Encounter: Payer: Self-pay | Admitting: Family

## 2023-11-09 ENCOUNTER — Ambulatory Visit (INDEPENDENT_AMBULATORY_CARE_PROVIDER_SITE_OTHER)

## 2023-11-09 VITALS — BP 126/72 | HR 84 | Temp 97.6°F | Ht 64.0 in | Wt 133.4 lb

## 2023-11-09 DIAGNOSIS — E782 Mixed hyperlipidemia: Secondary | ICD-10-CM | POA: Diagnosis not present

## 2023-11-09 DIAGNOSIS — M25552 Pain in left hip: Secondary | ICD-10-CM | POA: Diagnosis not present

## 2023-11-09 DIAGNOSIS — M545 Low back pain, unspecified: Secondary | ICD-10-CM | POA: Diagnosis not present

## 2023-11-09 DIAGNOSIS — M47816 Spondylosis without myelopathy or radiculopathy, lumbar region: Secondary | ICD-10-CM | POA: Diagnosis not present

## 2023-11-09 DIAGNOSIS — M48061 Spinal stenosis, lumbar region without neurogenic claudication: Secondary | ICD-10-CM | POA: Diagnosis not present

## 2023-11-09 DIAGNOSIS — M160 Bilateral primary osteoarthritis of hip: Secondary | ICD-10-CM | POA: Diagnosis not present

## 2023-11-09 NOTE — Progress Notes (Signed)
 Assessment & Plan:  Left hip pain Assessment & Plan: D/D trochanteric bursitis, SI joint dysfunction. Pending Xray lumbar and pelvis. Advised anticipating pain and use of ice, tylenol  arthritis scheduled around excercise. Consider PT after images.   Orders: -     DG Lumbar Spine Complete; Future -     DG HIPS BILAT W OR W/O PELVIS 3-4 VIEWS; Future  Mixed hyperlipidemia Assessment & Plan: LDL 101. Stable. Continue crestor  5mg  The 10-year ASCVD risk score (Stacey Munoz, et al., 2019) is: 21.7% Ct calcium  score obtained 04/2021 ,2.21.       Return precautions given.   Risks, benefits, and alternatives of the medications and treatment plan prescribed today were discussed, and patient expressed understanding.   Education regarding symptom management and diagnosis given to patient on AVS either electronically or printed.  No follow-ups on file.  Rollene Northern, FNP  Subjective:    Patient ID: Stacey Munoz, female    DOB: July 05, 1945, 78 y.o.   MRN: [id number]  CC: Stacey Munoz is a 78 y.o. female who presents today for follow up.   HPI: Complains of left lateral hip pain for years.     Right hip lower than left.  She goes to chiropractor and massage monthly with some relief.  She takes prn ibuprofen with very little relief.  Walking over 1.5 miles will cause left hip pain. Some discomfort with sleeping on left hip.  DG lumbar spine 11/06/21  Chronic degenerative disc disease and facet hypertrophy at L4-5 and L5-S1.  Denies numbness, saddle anesthesia, urinary or fecal incontinence, groin pain.   No h/o cancer  No h/o PUD.   Allergies: Patient has no known allergies. Current Outpatient Medications on File Prior to Visit  Medication Sig Dispense Refill   CALCIUM  PO Take 1,000 mg by mouth daily at 12 noon.     Cholecalciferol (VITAMIN D3 PO) Take 20 mcg by mouth daily at 12 noon.     Flaxseed Oil OIL Take 30 mLs by mouth daily at 12 noon. 2 tablespoons = 30 mL      fluorouracil  (EFUDEX ) 5 % cream Apply topically 2 (two) times daily. twice a day for 10 days to affected areas including right nose, right cheek, right chest. 30 g 1   fluticasone (FLONASE) 50 MCG/ACT nasal spray Place 1 spray into both nostrils daily.     rosuvastatin  (CRESTOR ) 5 MG tablet Take 1 tablet (5 mg total) by mouth daily. 90 tablet 3   Azelaic Acid  15 % gel Apply to face QHS. (Patient not taking: Reported on 11/09/2023) 45 g 1   metroNIDAZOLE  (METROCREAM ) 0.75 % cream Apply to affected skin qd-bid (Patient not taking: Reported on 11/09/2023) 45 g 6   No current facility-administered medications on file prior to visit.    Review of Systems  Constitutional:  Negative for chills and fever.  Respiratory:  Negative for cough.   Cardiovascular:  Negative for chest pain and palpitations.  Gastrointestinal:  Negative for nausea and vomiting.  Musculoskeletal:  Positive for arthralgias. Negative for back pain.  Neurological:  Negative for numbness.      Objective:    BP 126/72   Pulse 84   Temp 97.6 F (36.4 C) (Oral)   Ht 5' 4 (1.626 m)   Wt 133 lb 6.4 oz (60.5 kg)   SpO2 96%   BMI 22.90 kg/m  BP Readings from Last 3 Encounters:  11/09/23 126/72  05/12/23 128/78  02/11/23 138/88   Wt Readings from Last  3 Encounters:  11/09/23 133 lb 6.4 oz (60.5 kg)  07/28/23 132 lb (59.9 kg)  05/12/23 135 lb 8 oz (61.5 kg)    Physical Exam Vitals reviewed.  Constitutional:      Appearance: She is well-developed.  Eyes:     Conjunctiva/sclera: Conjunctivae normal.  Cardiovascular:     Rate and Rhythm: Normal rate and regular rhythm.     Pulses: Normal pulses.     Heart sounds: Normal heart sounds.  Pulmonary:     Effort: Pulmonary effort is normal.     Breath sounds: Normal breath sounds. No wheezing, rhonchi or rales.  Musculoskeletal:     Lumbar back: No swelling, edema, spasms, tenderness or bony tenderness. Normal range of motion. Negative right straight leg raise test and  negative left straight leg raise test.     Comments: Full range of motion with flexion, tension, lateral side bends. No bony tenderness. No pain, numbness, tingling elicited with single leg raise bilaterally.  Left Hip: No limp or waddling gait. Full ROM with flexion and hip rotation in flexion.    No pain of lateral hip with  (flexion-abduction-external rotation) test.   No pain with deep palpation of greater trochanter.     Skin:    General: Skin is warm and dry.  Neurological:     Mental Status: She is alert.     Sensory: No sensory deficit.     Deep Tendon Reflexes:     Reflex Scores:      Patellar reflexes are 2+ on the right side and 2+ on the left side.    Comments: Sensation and strength intact bilateral lower extremities.  Psychiatric:        Speech: Speech normal.        Behavior: Behavior normal.        Thought Content: Thought content normal.

## 2023-11-14 NOTE — Assessment & Plan Note (Signed)
 D/D trochanteric bursitis, SI joint dysfunction. Pending Xray lumbar and pelvis. Advised anticipating pain and use of ice, tylenol  arthritis scheduled around excercise. Consider PT after images.

## 2023-11-14 NOTE — Assessment & Plan Note (Signed)
 LDL 101. Stable. Continue crestor  5mg  The 10-year ASCVD risk score (Cathyann Kilfoyle DK, et al., 2019) is: 21.7% Ct calcium  score obtained 04/2021 ,2.21.

## 2023-11-14 NOTE — Patient Instructions (Signed)
 Pending xray images Consider physical therapy referral. Very nice meeting you today

## 2023-11-15 ENCOUNTER — Ambulatory Visit: Payer: Self-pay | Admitting: Family

## 2023-11-18 ENCOUNTER — Other Ambulatory Visit: Payer: Self-pay | Admitting: Family

## 2023-11-18 DIAGNOSIS — D259 Leiomyoma of uterus, unspecified: Secondary | ICD-10-CM

## 2023-11-18 DIAGNOSIS — M47816 Spondylosis without myelopathy or radiculopathy, lumbar region: Secondary | ICD-10-CM

## 2023-11-30 ENCOUNTER — Ambulatory Visit
Admission: RE | Admit: 2023-11-30 | Discharge: 2023-11-30 | Disposition: A | Discharge status: Home / Self Care | Source: Ambulatory Visit | Attending: Family | Admitting: Family

## 2023-11-30 ENCOUNTER — Other Ambulatory Visit: Payer: Self-pay | Admitting: Family

## 2023-11-30 DIAGNOSIS — D259 Leiomyoma of uterus, unspecified: Secondary | ICD-10-CM | POA: Insufficient documentation

## 2023-11-30 DIAGNOSIS — M47816 Spondylosis without myelopathy or radiculopathy, lumbar region: Secondary | ICD-10-CM

## 2023-12-16 ENCOUNTER — Ambulatory Visit: Payer: Self-pay | Admitting: Family

## 2023-12-30 ENCOUNTER — Encounter: Admitting: Family

## 2024-06-20 ENCOUNTER — Ambulatory Visit: Admitting: Dermatology

## 2024-08-02 ENCOUNTER — Ambulatory Visit
